# Patient Record
Sex: Female | Born: 1935 | Race: White | Hispanic: No | Smoking: Never smoker
Health system: Southern US, Community
[De-identification: ages and names within clinical notes are randomized; demographics above are authoritative.]

## PROBLEM LIST (undated history)

## (undated) DIAGNOSIS — F419 Anxiety disorder, unspecified: Secondary | ICD-10-CM

## (undated) DIAGNOSIS — A499 Bacterial infection, unspecified: Secondary | ICD-10-CM

## (undated) DIAGNOSIS — I2510 Atherosclerotic heart disease of native coronary artery without angina pectoris: Secondary | ICD-10-CM

## (undated) DIAGNOSIS — E559 Vitamin D deficiency, unspecified: Secondary | ICD-10-CM

## (undated) DIAGNOSIS — K219 Gastro-esophageal reflux disease without esophagitis: Secondary | ICD-10-CM

## (undated) DIAGNOSIS — F0280 Dementia in other diseases classified elsewhere without behavioral disturbance: Secondary | ICD-10-CM

## (undated) DIAGNOSIS — M6530 Trigger finger, unspecified finger: Secondary | ICD-10-CM

## (undated) DIAGNOSIS — I219 Acute myocardial infarction, unspecified: Secondary | ICD-10-CM

## (undated) DIAGNOSIS — F329 Major depressive disorder, single episode, unspecified: Secondary | ICD-10-CM

## (undated) DIAGNOSIS — E785 Hyperlipidemia, unspecified: Secondary | ICD-10-CM

## (undated) DIAGNOSIS — M810 Age-related osteoporosis without current pathological fracture: Secondary | ICD-10-CM

## (undated) DIAGNOSIS — I209 Angina pectoris, unspecified: Secondary | ICD-10-CM

## (undated) DIAGNOSIS — F0150 Vascular dementia without behavioral disturbance: Secondary | ICD-10-CM

## (undated) DIAGNOSIS — R42 Dizziness and giddiness: Secondary | ICD-10-CM

## (undated) DIAGNOSIS — E039 Hypothyroidism, unspecified: Secondary | ICD-10-CM

## (undated) DIAGNOSIS — M1990 Unspecified osteoarthritis, unspecified site: Secondary | ICD-10-CM

## (undated) DIAGNOSIS — F32A Depression, unspecified: Secondary | ICD-10-CM

## (undated) DIAGNOSIS — I10 Essential (primary) hypertension: Secondary | ICD-10-CM

## (undated) DIAGNOSIS — M069 Rheumatoid arthritis, unspecified: Secondary | ICD-10-CM

## (undated) HISTORY — DX: Acute myocardial infarction, unspecified: I21.9

## (undated) HISTORY — DX: Dizziness and giddiness: R42

## (undated) HISTORY — DX: Rheumatoid arthritis, unspecified: M06.9

## (undated) HISTORY — DX: Vitamin D deficiency, unspecified: E55.9

## (undated) HISTORY — DX: Hyperlipidemia, unspecified: E78.5

## (undated) HISTORY — PX: CORONARY ANGIOPLASTY: SHX604

## (undated) HISTORY — DX: Anxiety disorder, unspecified: F41.9

## (undated) HISTORY — DX: Age-related osteoporosis without current pathological fracture: M81.0

## (undated) HISTORY — DX: Vascular dementia, unspecified severity, without behavioral disturbance, psychotic disturbance, mood disturbance, and anxiety: F01.50

## (undated) HISTORY — DX: Depression, unspecified: F32.A

## (undated) HISTORY — DX: Trigger finger, unspecified finger: M65.30

## (undated) HISTORY — DX: Angina pectoris, unspecified: I20.9

## (undated) HISTORY — PX: HERNIA REPAIR: SHX51

## (undated) HISTORY — DX: Bacterial infection, unspecified: A49.9

## (undated) HISTORY — DX: Atherosclerotic heart disease of native coronary artery without angina pectoris: I25.10

## (undated) HISTORY — PX: KNEE ARTHROSCOPY: SHX127

---
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
## 1967-05-28 HISTORY — PX: ABDOMINAL HYSTERECTOMY: SHX81

## 1981-05-27 HISTORY — PX: TOTAL GASTRECTOMY: SHX2540

## 2002-05-27 HISTORY — PX: BREAST SURGERY: SHX581

## 2003-05-28 HISTORY — PX: BREAST BIOPSY: SHX20

## 2004-05-27 HISTORY — PX: OTHER SURGICAL HISTORY: SHX169

## 2004-12-10 ENCOUNTER — Encounter: Admission: RE | Admit: 2004-12-10 | Discharge: 2004-12-10 | Payer: Self-pay | Admitting: Oncology

## 2005-06-28 ENCOUNTER — Ambulatory Visit: Payer: Self-pay | Admitting: Oncology

## 2005-08-20 ENCOUNTER — Encounter: Admission: RE | Admit: 2005-08-20 | Discharge: 2005-08-20 | Payer: Self-pay | Admitting: Oncology

## 2005-11-01 ENCOUNTER — Ambulatory Visit: Payer: Self-pay | Admitting: Oncology

## 2005-12-19 ENCOUNTER — Encounter: Admission: RE | Admit: 2005-12-19 | Discharge: 2005-12-19 | Payer: Self-pay | Admitting: Oncology

## 2006-03-19 ENCOUNTER — Encounter: Admission: RE | Admit: 2006-03-19 | Discharge: 2006-06-17 | Payer: Self-pay | Admitting: Nurse Practitioner

## 2006-05-02 ENCOUNTER — Ambulatory Visit: Payer: Self-pay | Admitting: Oncology

## 2006-05-06 LAB — CBC WITH DIFFERENTIAL/PLATELET
BASO%: 0.2 % (ref 0.0–2.0)
EOS%: 2.4 % (ref 0.0–7.0)
HGB: 13.4 g/dL (ref 11.6–15.9)
LYMPH%: 29.8 % (ref 14.0–48.0)
MCHC: 33.8 g/dL (ref 32.0–36.0)
NEUT#: 3.3 10*3/uL (ref 1.5–6.5)
NEUT%: 60.7 % (ref 39.6–76.8)
WBC: 5.4 10*3/uL (ref 3.9–10.0)

## 2006-05-06 LAB — COMPREHENSIVE METABOLIC PANEL
ALT: 12 U/L (ref 0–35)
AST: 17 U/L (ref 0–37)
BUN: 23 mg/dL (ref 6–23)
Creatinine, Ser: 0.92 mg/dL (ref 0.40–1.20)
Total Bilirubin: 0.5 mg/dL (ref 0.3–1.2)

## 2006-10-30 ENCOUNTER — Ambulatory Visit: Payer: Self-pay | Admitting: Oncology

## 2006-12-22 ENCOUNTER — Encounter: Admission: RE | Admit: 2006-12-22 | Discharge: 2006-12-22 | Payer: Self-pay | Admitting: Oncology

## 2006-12-25 ENCOUNTER — Inpatient Hospital Stay (HOSPITAL_COMMUNITY): Admission: EM | Admit: 2006-12-25 | Discharge: 2006-12-27 | Payer: Self-pay | Admitting: Cardiology

## 2006-12-25 ENCOUNTER — Encounter: Payer: Self-pay | Admitting: Emergency Medicine

## 2007-07-22 ENCOUNTER — Encounter: Admission: RE | Admit: 2007-07-22 | Discharge: 2007-07-22 | Payer: Self-pay | Admitting: Internal Medicine

## 2007-09-15 ENCOUNTER — Ambulatory Visit: Payer: Self-pay | Admitting: Gastroenterology

## 2007-09-29 ENCOUNTER — Ambulatory Visit: Payer: Self-pay | Admitting: Gastroenterology

## 2007-10-13 ENCOUNTER — Ambulatory Visit: Payer: Self-pay | Admitting: Gastroenterology

## 2008-03-31 ENCOUNTER — Encounter: Admission: RE | Admit: 2008-03-31 | Discharge: 2008-03-31 | Payer: Self-pay | Admitting: Oncology

## 2008-05-27 DIAGNOSIS — I219 Acute myocardial infarction, unspecified: Secondary | ICD-10-CM

## 2008-05-27 HISTORY — DX: Acute myocardial infarction, unspecified: I21.9

## 2008-06-28 ENCOUNTER — Inpatient Hospital Stay (HOSPITAL_COMMUNITY): Admission: AD | Admit: 2008-06-28 | Discharge: 2008-06-29 | Payer: Self-pay | Admitting: Cardiology

## 2008-08-12 ENCOUNTER — Encounter: Payer: Self-pay | Admitting: Gastroenterology

## 2008-12-28 ENCOUNTER — Encounter: Admission: RE | Admit: 2008-12-28 | Discharge: 2008-12-28 | Payer: Self-pay | Admitting: Internal Medicine

## 2009-06-27 HISTORY — PX: HAMMER TOE SURGERY: SHX385

## 2009-12-25 HISTORY — PX: OTHER SURGICAL HISTORY: SHX169

## 2010-02-16 ENCOUNTER — Inpatient Hospital Stay (HOSPITAL_COMMUNITY): Admission: RE | Admit: 2010-02-16 | Discharge: 2010-02-20 | Payer: Self-pay | Admitting: Orthopedic Surgery

## 2010-08-09 LAB — GLUCOSE, CAPILLARY
Glucose-Capillary: 104 mg/dL — ABNORMAL HIGH (ref 70–99)
Glucose-Capillary: 108 mg/dL — ABNORMAL HIGH (ref 70–99)
Glucose-Capillary: 109 mg/dL — ABNORMAL HIGH (ref 70–99)
Glucose-Capillary: 119 mg/dL — ABNORMAL HIGH (ref 70–99)
Glucose-Capillary: 122 mg/dL — ABNORMAL HIGH (ref 70–99)
Glucose-Capillary: 141 mg/dL — ABNORMAL HIGH (ref 70–99)
Glucose-Capillary: 79 mg/dL (ref 70–99)
Glucose-Capillary: 84 mg/dL (ref 70–99)

## 2010-08-09 LAB — COMPREHENSIVE METABOLIC PANEL
ALT: 15 U/L (ref 0–35)
Albumin: 3.9 g/dL (ref 3.5–5.2)
Alkaline Phosphatase: 70 U/L (ref 39–117)
Calcium: 9.1 mg/dL (ref 8.4–10.5)
Glucose, Bld: 122 mg/dL — ABNORMAL HIGH (ref 70–99)
Potassium: 3.6 mEq/L (ref 3.5–5.1)
Sodium: 141 mEq/L (ref 135–145)
Total Protein: 6.4 g/dL (ref 6.0–8.3)

## 2010-08-09 LAB — CBC
HCT: 31.3 % — ABNORMAL LOW (ref 36.0–46.0)
MCH: 31.5 pg (ref 26.0–34.0)
MCHC: 33.5 g/dL (ref 30.0–36.0)
MCV: 92.9 fL (ref 78.0–100.0)
Platelets: 214 10*3/uL (ref 150–400)
RDW: 13.8 % (ref 11.5–15.5)
RDW: 13.8 % (ref 11.5–15.5)
WBC: 4.7 10*3/uL (ref 4.0–10.5)

## 2010-08-09 LAB — SURGICAL PCR SCREEN
MRSA, PCR: NEGATIVE
Staphylococcus aureus: NEGATIVE

## 2010-08-09 LAB — HEMOGLOBIN A1C: Hgb A1c MFr Bld: 6.8 % — ABNORMAL HIGH (ref <5.7)

## 2010-09-11 LAB — BASIC METABOLIC PANEL
BUN: 9 mg/dL (ref 6–23)
Calcium: 8.4 mg/dL (ref 8.4–10.5)
GFR calc non Af Amer: >60 mL/min (ref >60)
Glucose, Bld: 132 mg/dL — ABNORMAL HIGH (ref 70–99)
Potassium: 3.5 mEq/L (ref 3.5–5.1)
Sodium: 136 mEq/L (ref 135–145)

## 2010-09-11 LAB — GLUCOSE, CAPILLARY
Glucose-Capillary: 105 mg/dL — ABNORMAL HIGH (ref 70–99)
Glucose-Capillary: 122 mg/dL — ABNORMAL HIGH (ref 70–99)
Glucose-Capillary: 128 mg/dL — ABNORMAL HIGH (ref 70–99)
Glucose-Capillary: 129 mg/dL — ABNORMAL HIGH (ref 70–99)

## 2010-09-11 LAB — CBC
Platelets: 194 10*3/uL (ref 150–400)
RDW: 13.4 % (ref 11.5–15.5)
WBC: 4.8 10*3/uL (ref 4.0–10.5)

## 2010-10-09 NOTE — Discharge Summary (Signed)
NAME:  [name], Michelle [name] NO.:  [id number]   MEDICAL RECORD NO.:  [id number]          PATIENT TYPE:  INP   LOCATION:  2508                         FACILITY:  [facility]   PHYSICIAN:  Jake Bathe, MD      DATE OF BIRTH:  April 28, 1928   DATE OF ADMISSION:  06/28/2008  DATE OF DISCHARGE:  06/29/2008                               DISCHARGE SUMMARY   DISCHARGE DIAGNOSES:  1. Coronary artery disease status post Driver stent to the mid right      coronary artery.  2. Dyslipidemia.  3. Hypothyroidism.  4. Hypertension.  5. Diabetes mellitus.  6. Long-term medication use.   HOSPITAL COURSE:  [name] is [name] who had [name] recent  stress test for intrascapular discomfort with exertion that is relieved  with nitroglycerin.  Her stress test showed evidence of mild-to-moderate  ischemia in the mid anterior septal, apical anterior, mid inferior  lateral, and mid anterior lateral regions.   She was brought to the catheterization lab on June 28, 2008, and  found to have [name].  She has had [name]  previous stent in her circumflex and this was patent.  The patient then  underwent Driver stent placement to the right coronary artery without  problem.  The patient then remained in the hospital overnight and was  stable the following day and was felt to be stable enough to go home.   Lab studies show hemoglobin 11.4, hematocrit 34.6, platelets 194, white  count 4.8.  Sodium 136, potassium 3.5, BUN 9, creatinine 0.52.   DISCHARGE MEDICATIONS:  1. Levoxyl 50 mcg daily.  2. Toprol-XL 25 mg [name].  3. Zocor 40 mg [name].  4. Sublingual nitroglycerin p.r.n. chest pain.  5. Enteric-coated aspirin 325 mg [name].  6. Hydrochlorothiazide 25 mg [name].  7. Plavix 75 mg [name].  8. The patient is to restart metformin on Saturday, July 02, 2008.   The patient is to remain on [name].  Clean cath site gently with soap  and water.  No scrubbing.  Increase  activity slowly as per cardiac rehabilitation.  No lifting over 10  pounds for 1 week.  No driving for 2 days.  Follow up with Dr. Anne Fu on  July 14, 2008, at 10 [name].m.      Guy Franco, P.[name].      Jake Bathe, MD  Electronically Signed    LB/MEDQ  D:  06/29/2008  T:  06/29/2008  Job:  [id number]

## 2010-10-09 NOTE — Cardiovascular Report (Signed)
NAME:  Michelle [name], STOFKO NO.:  [id number]   MEDICAL RECORD NO.:  [id number]          PATIENT TYPE:  INP   LOCATION:  2508                         FACILITY:  [facility]   PHYSICIAN:  Jake Bathe, MD      DATE OF BIRTH:  1927-10-05   DATE OF PROCEDURE:  06/28/2008  DATE OF DISCHARGE:                            CARDIAC CATHETERIZATION   CARDIOLOGIST:  Francisca December, MD   Veverly Fells. Anne Fu, MD is Designer, television/film set.   PROCEDURE:  1. Left heart catheterization  2. Selective coronary angiography.  3. Left ventriculogram.  4. Abdominal aortogram.   INDICATIONS:  Abnormal nuclear stress test with prior coronary artery  disease, circumflex stent.   PROCEDURE IN DETAILS:  Informed consent was obtained.  Risk of stroke,  heart attack, death, renal impairment, and bleeding were explained to  the patient at length.  A fluoroscopic visualization of the femoral head  was obtained.  Lidocaine 1% was used for local anesthesia.  Modified  Seldinger technique was used to insert a 5-French sheath into the right  femoral artery.  The wire was inserted up into the iliac bifurcation.  Then, sheath was inserted under fluoroscopic guidance.  A Judkins right  #4 catheter was then used to steer the wire up past the iliac into the  abdominal aorta, then around to the heart.  This catheter was used to  selectively cannulate the right coronary artery.  An exchange-length  wire was then used and a Judkins left #4 catheter was used to cannulate  the left main artery.  Multiple views with hand injection of Omnipaque  were obtained with both catheters.  The pigtail catheter was then used  to cross the aortic valve into the left ventricle and a left  ventriculogram using power injection in the RAO position with 25 mL of  dye was performed.  Following this, pullback across the aortic valve was  obtained.  The angle pigtail was then brought to the level of the renal  arteries and a nonselective abdominal  aortogram was then performed  utilizing 40 mL of contrast.  Following the procedure, the 5-French  sheath was sewn into place, 4000 units of IV heparin was administered  and the patient was placed in the recovery room for approximately 30  minutes to 1 hour until Dr. Corliss Marcus was available to perform right  coronary artery percutaneous intervention.  Currently, Dr. Amil Amen is in  an emergency case with an acute-ST-elevation myocardial infarction.  Findings were discussed with both the patient family and Dr. Amil Amen.   FINDINGS:  1. Left main artery - it bifurcates into the LAD and circumflex.  No      angiographically significant coronary artery disease.  2. Left anterior descending artery - there is 1 major diagonal branch      and the artery continues to wrap around the apex.  There are minor      irregularities throughout this arterial system.  However, no      significant coronary artery disease present.  3. Circumflex artery - the proximally placed circumflex stent is  widely patent.  This was placed on December 26, 2006, by Dr. Amil Amen      and is a multi-length Vision stent 3.5 mm x 15 mm.  There are 3      obtuse marginal arteries, all of which are widely patent.  4. Right coronary artery - there is a mid focal 80% stenosis which is      new from her prior cardiac catheterization.  This is the dominant      vessel giving rise to the PDA.  5. Left ventriculogram - no wall motion abnormality, normal ejection      fraction of approximately 60-65% with 1+ mitral regurgitation which      may be catheter induced.   HEMODYNAMICS:  Left ventricular systolic pressure 134 with an end-  diastolic pressure of 13 mmHg and aortic pressure 138/67 with a mean of  95 mmHg.  There is no gradient across the aortic valve.   Abdominal aortogram - bilateral renal arteries are widely patent.  The  proximal portion of the right internal iliac is highly tortuous with  mild atherosclerosis  present.  Otherwise, there is no evidence of  abdominal aortic aneurysm.   IMPRESSION:  1. New focal 80% mid right coronary artery stenosis.  2. Widely patent Vision circumflex stent.  3. Normal left ventricular ejection fraction of 65%  4. 1+ mitral regurgitation which may be catheter induced.  5. Tortuous right common iliac artery with mild atherosclerosis - no      evidence of renal artery stenosis or abdominal aortic aneurysm.   PLAN:  Findings were discussed with Dr. Amil Amen who currently is in an  emergency procedure, STEMI.  The patient as stated above was given 4000  units of IV heparin, a 5-French sheath was sewn into place, and taken to  recovery room for approximately 30-45 minutes until Dr. Amil Amen was  available for right coronary artery percutaneous intervention.  Findings  were discussed with family as discussed above.      Jake Bathe, MD  Electronically Signed     MCS/MEDQ  D:  06/28/2008  T:  06/29/2008  Job:  [id number]   cc:   Francisca December, M.D.

## 2010-10-09 NOTE — Cardiovascular Report (Signed)
NAMEMarland Kitchen  Michelle, [name] NO.:  [id number]   MEDICAL RECORD NO.:  [id number]          PATIENT TYPE:  INP   LOCATION:  2508                         FACILITY:  [facility]   PHYSICIAN:  Francisca December, M.D.  DATE OF BIRTH:  [birth year]   DATE OF PROCEDURE:  06/28/2008  DATE OF DISCHARGE:  06/29/2008                            CARDIAC CATHETERIZATION   PROCEDURES PERFORMED:  1. Percutaneous coronary intervention/bare-metal stent implantation,      mid right coronary.  2. Percutaneous closure, right femoral artery.   INDICATIONS:  Michelle [name] is a pleasant 77 year old woman with atypical  angina who underwent a preoperative risk screen using pharmacologic-  induced myocardial perfusion imaging.  It did reveal apical and inferior  reversible defect.  She has been brought to the hospital for outpatient  cardiac catheterization revealing 80% stenosis in the large mid right  coronary.  This study was completed by Dr. Donato Schultz.  She is returned  to the laboratory at this time for percutaneous coronary intervention  via the previously placed catheter sheath.   PROCEDURAL NOTE:  The patient was brought to the cardiac catheterization  laboratory from the holding area and in stable condition.  The right  groin was prepped and draped in usual sterile fashion.  The 5-French  catheter sheath had remained in place.  Local anesthesia was obtained  with additional 2% Xylocaine.  The catheter sheath was then removed and  exchanged for a 6-French catheter sheath over a long guiding J-wire.  Gloves were changed and we proceeded with the intervention.  The patient  had an ACT performed, which was 160 seconds.  She had previously  received 4000 units of heparin approximately 2-1/2 hours prior to the  procedure.  She received an additional 20 units per kg.  She also  received a double bolus and constant infusion of Integrilin.  Resultant  ACT was 305 seconds.  A 6-French #4 right Judkins  guiding catheter was  advanced to the ascending aorta where the right coronary os was engaged.  A 0.014-inch Luge intracoronary guidewire was passed across the lesion  without difficulty.  Initial balloon dilatation was performed with a  3.0/15-mm apex balloon dilating catheter.  This was inflated to 6  atmospheres for 39 seconds.  It was deflated and removed and a 3.5/15-mm  ABET Multilink Vision bare-metal stent was then advanced into place and  deployed at peak pressure of 15 atmospheres for 29 seconds.  The stent  balloon was deflated and removed and a postdilatation balloon 4.0 x 12  mm Scimed Quantum Maverick was advanced into place and carefully  positioned within the stent.  It was inflated to 16 atmospheres for 53  seconds.  Following these maneuvers, there was wide patency confirmed in  orthogonal views both with and without the guidewire in place.  A  guiding catheter was then removed.  A right femoral arteriogram in the  45 degrees RAO angulation via the catheter sheath by hand injection  documented adequate anatomy for placement of percutaneous closure device  Angio-Seal.  This was subsequently deployed with good  hemostasis and an  intact pulse.   Angiography, as mentioned the lesion treated was in the midportion of  the right coronary was relatively focal in nature, concentric, and 80%  stenotic.  Following balloon dilatation and stent implantation, there  was no residual stenosis.   IMPRESSION:  1. Atherosclerotic cardiovascular disease, two-vessel.  The patient      has previous stent implant in the left      circumflex, which was widely patent confirmed by Dr. Anne Fu.  2. Successful percutaneous coronary intervention/bare-metal stent      implantation, mid right coronary.  3. Typical angina was not reproduced with device insertion or balloon      inflation.      Francisca December, M.D.  Electronically Signed     JHE/MEDQ  D:  06/29/2008  T:  06/30/2008  Job:   [id number]

## 2010-10-09 NOTE — Letter (Signed)
September 15, 2007    Ralene Ok, M.D.  [address]  [place], [place] [address]   RE:  HAYDAN, WEDIG  MRN:  [id number]  /  DOB:  10-10-1927   Dear Dr. Ludwig Clarks:   Upon your kind referral, I had the pleasure of evaluating your patient  and I am pleased to offer my findings.  I saw Michelle [name]. Castrillon in the  office today.  Enclosed is a copy of my progress note that details my  findings and recommendations.   Thank you for the opportunity to participate in your patient's care.    Sincerely,      Barbette Hair. Arlyce Dice, MD,FACG  Electronically Signed    RDK/MedQ  DD: 09/15/2007  DT: 09/15/2007  Job #: [id number]

## 2010-10-09 NOTE — Assessment & Plan Note (Signed)
[facility]                         GASTROENTEROLOGY OFFICE NOTE   Michelle [name], Michelle [name]                         MRN:          [id number]  DATE:09/15/2007                            DOB:          May 01, 1928    REASON FOR CONSULTATION:  Constipation.   Michelle [name] is a pleasant 77 year old white female referred through the  courtesy of Dr. Ludwig Clarks for evaluation.  Over the past two months, she  has noted an abrupt change in bowel habits, characterized by  constipation.  She will go up to 10 days without a spontaneous bowel  movement.  She denies abdominal or rectal pain.  There is no history of  melena or hematochezia.  A recent CT scan showed diverticulosis only.   PAST MEDICAL HISTORY:  1. Coronary artery disease.  She is status post MI and angioplasty      with stent placement.  2. She has diabetes.  3. Arthritis.  4. History of breast cancer.  5. Kidney stones.  6. She is status post partial gastrectomy for ulcer disease.  7. She is status post herniorrhaphy.  8. Hysterectomy.   FAMILY HISTORY:  Noncontributory.   MEDICATIONS:  Metformin, metoprolol, Levoxyl, simvastatin, and HCTZ.   She has no allergies.   She does not smoke.  She drinks rarely.  She is married and retired.   REVIEW OF SYSTEMS:  Positive for joint pain, sleeping problems, back  pain, and some leakage of urine.  Otherwise negative.   PHYSICAL EXAMINATION:  GENERAL:  She is a healthy-appearing female.  VITAL SIGNS:  Pulse 60, blood pressure 106/54, weight 167.  HEENT: EOMI.  PERRLA.  Sclerae are anicteric.  Conjunctivae are pink.  NECK:  Supple without thyromegaly, adenopathy or carotid bruits.  CHEST:  Clear to auscultation and percussion without adventitious  sounds.  CARDIAC:  Regular rhythm; normal S1 S2.  There are no murmurs, gallops  or rubs.  ABDOMEN:  Bowel sounds are normoactive.  Abdomen is soft, nontender and  nondistended.  There are no abdominal masses,  tenderness, splenic  enlargement or hepatomegaly.  EXTREMITIES:  Full range of motion.  No cyanosis, clubbing or edema.  RECTAL:  Deferred.  MUSCULOSKELETAL:  There are no deformities.  NEUROLOGIC:  Nonfocal.   IMPRESSION:  1. New onset constipation.  Obstructive lesion in the colon is a      consideration.  She could have luminal narrowing from diverticular      disease.  A partial obstruction from a neoplasm is also a      consideration.  2. Diabetes and coronary artery disease - stable.  3. History of breast cancer.   RECOMMENDATION:  Colonoscopy.     Barbette Hair. Arlyce Dice, MD,FACG  Electronically Signed    RDK/MedQ  DD: 09/15/2007  DT: 09/15/2007  Job #: [id number]   cc:   Ralene Ok, M.D.

## 2010-10-09 NOTE — Cardiovascular Report (Signed)
NAME:  [name], [name] NO.:  [id number]   MEDICAL RECORD NO.:  [id number]          PATIENT TYPE:  INP   LOCATION:  6529                         FACILITY:  [facility]   PHYSICIAN:  Francisca December, M.D.  DATE OF BIRTH:  [birth year]   DATE OF PROCEDURE:  12/26/2006  DATE OF DISCHARGE:                            CARDIAC CATHETERIZATION   PROCEDURES PERFORMED:  1. Left heart catheterization.  2. Left ventriculogram.  3. Coronary angiography.  4. Percutaneous coronary intervention/bare metal stent implantation at      proximal left circumflex.  5. Percutaneous closure of right femoral artery.   INDICATIONS:  Michelle [name] is a 77 year old woman without prior cardiac  history who presented yesterday with an unstable angina syndrome.  Overnight, she ruled in for a non-ST-segment elevation myocardial  infarction.  Peak CK-MB is 22.  Troponin was 5.  There were ischemic  electrographic changes laterally on her ECG, but no ST elevation.  She  is brought to catheterization laboratory at this time to identify the  extent of disease and provide for further therapeutic options.   PROCEDURE NOTE:  The patient is brought to cardiac catheterization  laboratory in the fasting state.  The right groin was prepped and draped  in the usual sterile fashion.  Local anesthesia was obtained with  infiltration of 1% lidocaine.  A 6-French catheter sheath was inserted  percutaneously into the right femoral artery utilizing an anterior  approach over a guiding J-wire.  Left heart catheterization and coronary  angiography then proceeded in the standard fashion using 6-French, #4  left and right Judkins catheters.  A 110-cm pigtail catheter was used to  perform cineangiography at the left ventricle.  This was done in an RAO  projection utilizing a power injector with 39 mL injected at 13  mL/second.  All catheter manipulations were performed using fluoroscopic  observation and exchanges performed  over long, guiding J-wire.   I then proceeded with percutaneous intervention.  A 6-French, #3.5 left,  Voda guiding catheter was advanced to the ascending aorta where the left  coronary os was engaged.  The patient received 25 mg of Lovenox  intravenously.  Her last subcutaneous dose was at 0500 hours.  She was  also on a constant infusion of Integrilin.  A 0.014 inch Luge  intracoronary guidewire was passed across the lesion in the proximal  circumflex without difficulty.  Initial balloon dilatation was performed  with a 3 x 15-mm Scimed Maverick.  This was carefully positioned and  inflated to a peak pressure of 6 atmospheres x39 seconds.  This balloon  was deflated and removed and a 3.5 x 15-mm, multi-length Vision bare  metal stent was advanced into place, carefully positioned and deployed  at peak pressure of 15 atmospheres x29 seconds.  The stent balloon was  deflated, removed and post dilatation performed with a 4 x 12-mm Quantum  Maverick intracoronary balloon.  This was carefully positioned within  the stent and inflated to a peak pressure of 16 atmospheres x53 seconds.  This balloon was deflated and removed and following confirmation of  adequate patency and orthogonal views, both with and without the  guidewire in place, the guiding catheter was removed.  A right femoral  arteriogram in the 45-degree RAO angulation via the catheter sheath by  hand injection documented adequate anatomy for placement of the  percutaneous closure device and Angio-Seal.  This was subsequent  deployed with good hemostasis and an intact distal pulse.  The patient  was transported to the recovery area in stable condition.   HEMODYNAMIC RESULTS:  Systemic arterial pressure was 100/55 with a mean  of 73 mmHg.  There was no systolic gradients across the aortic valve.  The left ventricular end-diastolic pressure was 19 mmHg pre-  ventriculogram.   Angiography:  The left ventriculogram demonstrated  normal chamber size  and normal global systolic function without clear regional wall motion  abnormality in a 30-degree RAO angulation.  A visual estimate of the  ejection fraction is 65-70%.  There is left coronary calcification seen.  The aortic valve is trileaflet and opens normally during systole.  Some  mitral vegetation is seen and it is difficult to quantify as there is  catheter-induced ventricular tachycardia.   There is a right-dominant coronary system present.  The main left  coronary was normal.   The left anterior descending artery and its branches demonstrated no  significant obstruction.  There is a 10% proximal eccentric stenosis.  A  single large diagonal branch arises and at the origin of this diagonal  branch, there is a 50% stenosis.  The ongoing anterior descending artery  reaches and traverses the apex.   The left circumflex coronary artery and its branches are highly  diseased.  The proximal portion of the vessel contains a subtotal  concentric, tubular, 90% stenosis with evidence of intraluminal  thrombus.  The ongoing circumflex then gives rise to three marginal  branches, all of which are large to moderate in size.  No obstructions  are seen in these more distal vessels.   The right coronary artery and its branches are large and dominant.  There is a tubular 20-30% mid vessel stenosis.  No obstructions are seen  in the distal vessel and distal vessel gives rise to a large acute  marginal which provides some distal septal perforators.  The posterior  descending artery is relatively small and then there is a moderate sized  posterolateral segment with three left ventricular branches, all of  which are small to moderate in size as well.   Collateral vessels are not seen.   Following balloon dilatation and stent implantation in the proximal  circumflex.  There was no residual stenosis seen.  There was a good  step-up and step-down at the proximal and distal  ends of the stent  respectively.  The stent itself was well-approximated to the vessel  wall.   FINAL IMPRESSION:  1. Atherosclerotic coronary vascular disease, single vessel.  2. Intact left ventricular size and global systolic function.  3. Status post successful percutaneous coronary intervention/bare      metal stent implantation proximal left circumflex.  4. Typical angina was not reproduced with device insertion or balloon      inflation.      Francisca December, M.D.  Electronically Signed     JHE/MEDQ  D:  12/26/2006  T:  12/26/2006  Job:  [id number]

## 2010-10-09 NOTE — Letter (Signed)
September 15, 2007    Kai Levins. Hicks   RE:  TINA, GRUNER  MRN:  [id number]  /  DOB:  February 25, 1928   Dear Ms. Charnley:   It is my pleasure to have treated you recently as a new patient in my  office.  I appreciate your confidence and the opportunity to participate  in your care.   Since I do have a busy inpatient endoscopy schedule and office schedule,  my office hours vary weekly.  I am, however, available for emergency  calls every day through my office.  If I cannot promptly meet an urgent  office appointment, another one of our gastroenterologists will be able  to assist you.   My well-trained staff are prepared to help you at all times.  For  emergencies after office hours, a physician from our gastroenterology  section is always available through my 24-hour answering service.   While you are under my care, I encourage discussion of your questions  and concerns, and I will be happy to return your calls as soon as I am  available.   Once again, I welcome you as a new patient and I look forward to a happy  and healthy relationship.    Sincerely,      Barbette Hair. Arlyce Dice, MD,FACG  Electronically Signed   RDK/MedQ  DD: 09/15/2007  DT: 09/15/2007  Job #: [id number]

## 2011-03-11 LAB — DIFFERENTIAL
Eosinophils Absolute: 0
Lymphocytes Relative: 9 — ABNORMAL LOW
Lymphs Abs: 0.6 — ABNORMAL LOW
Monocytes Relative: 3
Neutro Abs: 6.1
Neutrophils Relative %: 88 — ABNORMAL HIGH

## 2011-03-11 LAB — TROPONIN I: Troponin I: 0.17 — ABNORMAL HIGH

## 2011-03-11 LAB — CARDIAC PANEL(CRET KIN+CKTOT+MB+TROPI)
CK, MB: 22 — ABNORMAL HIGH
CK, MB: 9 — ABNORMAL HIGH
CK, MB: 23.3 — ABNORMAL HIGH
Relative Index: 6.8 — ABNORMAL HIGH
Relative Index: 9 — ABNORMAL HIGH
Relative Index: 8.8 — ABNORMAL HIGH
Total CK: 132
Total CK: 265 — ABNORMAL HIGH
Troponin I: 2.81
Troponin I: 5.08

## 2011-03-11 LAB — CK TOTAL AND CKMB (NOT AT ARMC)
CK, MB: 5.2 — ABNORMAL HIGH
Relative Index: 4.3 — ABNORMAL HIGH
Total CK: 171
Total CK: 120

## 2011-03-11 LAB — CBC
HCT: 32 — ABNORMAL LOW
Hemoglobin: 10.7 — ABNORMAL LOW
Hemoglobin: 12.6
MCHC: 34.2
MCV: 91.6
MCV: 90.3
Platelets: 177
Platelets: 194
RBC: 3.46 — ABNORMAL LOW
RBC: 4.07
RDW: 13.7
WBC: 5.8
WBC: 6.4

## 2011-03-11 LAB — COMPREHENSIVE METABOLIC PANEL
ALT: 21
BUN: 25 — ABNORMAL HIGH
CO2: 23
Calcium: 9.4
Creatinine, Ser: 0.87
GFR calc non Af Amer: >60
Glucose, Bld: 182 — ABNORMAL HIGH
Sodium: 141
Total Protein: 6.4

## 2011-03-11 LAB — LIPID PANEL
Cholesterol: 230 — ABNORMAL HIGH
HDL: 45
HDL: 56
LDL Cholesterol: 109 — ABNORMAL HIGH
LDL Cholesterol: 124 — ABNORMAL HIGH
Total CHOL/HDL Ratio: 4.1
Triglycerides: 103

## 2011-03-11 LAB — HEMOGLOBIN A1C
Hgb A1c MFr Bld: 6.4 — ABNORMAL HIGH
Mean Plasma Glucose: 151

## 2011-03-11 LAB — TSH: TSH: 0.339 — ABNORMAL LOW

## 2011-03-11 LAB — BASIC METABOLIC PANEL
CO2: 26
Chloride: 109
Creatinine, Ser: 0.6
GFR calc Af Amer: >60
Potassium: 3.9

## 2011-03-11 LAB — B-NATRIURETIC PEPTIDE (CONVERTED LAB): Pro B Natriuretic peptide (BNP): 129 — ABNORMAL HIGH

## 2011-03-11 LAB — D-DIMER, QUANTITATIVE: D-Dimer, Quant: 0.57 — ABNORMAL HIGH

## 2011-03-11 LAB — MAGNESIUM: Magnesium: 2

## 2011-03-11 LAB — PROTIME-INR: Prothrombin Time: 12

## 2011-03-11 LAB — POCT CARDIAC MARKERS: CKMB, poc: 3.3

## 2011-03-20 ENCOUNTER — Other Ambulatory Visit: Payer: Self-pay | Admitting: Internal Medicine

## 2011-03-20 ENCOUNTER — Ambulatory Visit
Admission: RE | Admit: 2011-03-20 | Discharge: 2011-03-20 | Disposition: A | Discharge status: Home / Self Care | Payer: Medicare Other | Source: Ambulatory Visit | Attending: Internal Medicine | Admitting: Internal Medicine

## 2011-03-20 DIAGNOSIS — M549 Dorsalgia, unspecified: Secondary | ICD-10-CM

## 2011-05-28 DIAGNOSIS — M069 Rheumatoid arthritis, unspecified: Secondary | ICD-10-CM

## 2011-05-28 HISTORY — DX: Rheumatoid arthritis, unspecified: M06.9

## 2011-07-15 ENCOUNTER — Other Ambulatory Visit: Payer: Self-pay | Admitting: Internal Medicine

## 2011-07-15 DIAGNOSIS — R1032 Left lower quadrant pain: Secondary | ICD-10-CM

## 2011-07-18 ENCOUNTER — Ambulatory Visit
Admission: RE | Admit: 2011-07-18 | Discharge: 2011-07-18 | Disposition: A | Discharge status: Home / Self Care | Payer: Medicare Other | Source: Ambulatory Visit | Attending: Internal Medicine | Admitting: Internal Medicine

## 2011-07-18 DIAGNOSIS — R1032 Left lower quadrant pain: Secondary | ICD-10-CM

## 2011-07-18 MED ORDER — IOHEXOL 300 MG/ML  SOLN
125.0000 mL | Freq: Once | INTRAMUSCULAR | Status: AC | PRN
  Administered 2011-07-18: 125 mL via INTRAVENOUS

## 2012-01-21 ENCOUNTER — Other Ambulatory Visit: Payer: Self-pay | Admitting: Internal Medicine

## 2012-01-21 DIAGNOSIS — M8580 Other specified disorders of bone density and structure, unspecified site: Secondary | ICD-10-CM

## 2012-01-29 ENCOUNTER — Ambulatory Visit
Admission: RE | Admit: 2012-01-29 | Discharge: 2012-01-29 | Disposition: A | Discharge status: Home / Self Care | Payer: Medicare Other | Source: Ambulatory Visit | Attending: Internal Medicine | Admitting: Internal Medicine

## 2012-01-29 DIAGNOSIS — M8580 Other specified disorders of bone density and structure, unspecified site: Secondary | ICD-10-CM

## 2012-02-14 ENCOUNTER — Encounter (HOSPITAL_COMMUNITY): Payer: Self-pay | Admitting: Pharmacy Technician

## 2012-02-21 ENCOUNTER — Ambulatory Visit (HOSPITAL_COMMUNITY)
Admission: RE | Admit: 2012-02-21 | Discharge: 2012-02-21 | Disposition: A | Discharge status: Home / Self Care | Payer: Medicare Other | Source: Ambulatory Visit | Attending: Orthopedic Surgery | Admitting: Orthopedic Surgery

## 2012-02-21 ENCOUNTER — Encounter (HOSPITAL_COMMUNITY): Payer: Self-pay

## 2012-02-21 ENCOUNTER — Encounter (HOSPITAL_COMMUNITY)
Admission: RE | Admit: 2012-02-21 | Discharge: 2012-02-21 | Disposition: A | Discharge status: Home / Self Care | Payer: Medicare Other | Source: Ambulatory Visit | Attending: Orthopedic Surgery | Admitting: Orthopedic Surgery

## 2012-02-21 DIAGNOSIS — Z01812 Encounter for preprocedural laboratory examination: Secondary | ICD-10-CM | POA: Insufficient documentation

## 2012-02-21 DIAGNOSIS — Z01818 Encounter for other preprocedural examination: Secondary | ICD-10-CM | POA: Insufficient documentation

## 2012-02-21 HISTORY — DX: Acute myocardial infarction, unspecified: I21.9

## 2012-02-21 HISTORY — DX: Atherosclerotic heart disease of native coronary artery without angina pectoris: I25.10

## 2012-02-21 HISTORY — DX: Unspecified osteoarthritis, unspecified site: M19.90

## 2012-02-21 HISTORY — DX: Essential (primary) hypertension: I10

## 2012-02-21 HISTORY — DX: Hypothyroidism, unspecified: E03.9

## 2012-02-21 HISTORY — DX: Gastro-esophageal reflux disease without esophagitis: K21.9

## 2012-02-21 LAB — URINALYSIS, ROUTINE W REFLEX MICROSCOPIC
Bilirubin Urine: NEGATIVE
Glucose, UA: NEGATIVE mg/dL
Hgb urine dipstick: NEGATIVE
Ketones, ur: NEGATIVE mg/dL
Nitrite: NEGATIVE
Protein, ur: NEGATIVE mg/dL
Specific Gravity, Urine: 1.029 (ref 1.005–1.030)
Urobilinogen, UA: 1 mg/dL (ref 0.0–1.0)
pH: 6.5 (ref 5.0–8.0)

## 2012-02-21 LAB — COMPREHENSIVE METABOLIC PANEL
ALT: 22 U/L (ref 0–35)
AST: 29 U/L (ref 0–37)
Albumin: 3.7 g/dL (ref 3.5–5.2)
Alkaline Phosphatase: 103 U/L (ref 39–117)
BUN: 17 mg/dL (ref 6–23)
CO2: 29 mEq/L (ref 19–32)
Calcium: 9.5 mg/dL (ref 8.4–10.5)
Chloride: 103 mEq/L (ref 96–112)
Creatinine, Ser: 0.67 mg/dL (ref 0.50–1.10)
GFR calc Af Amer: >90 mL/min (ref >90)
GFR calc non Af Amer: 80 mL/min — ABNORMAL LOW (ref >90)
Glucose, Bld: 123 mg/dL — ABNORMAL HIGH (ref 70–99)
Potassium: 3.7 mEq/L (ref 3.5–5.1)
Sodium: 141 mEq/L (ref 135–145)
Total Bilirubin: 0.6 mg/dL (ref 0.3–1.2)
Total Protein: 6.7 g/dL (ref 6.0–8.3)

## 2012-02-21 LAB — APTT: aPTT: 27 seconds (ref 24–37)

## 2012-02-21 LAB — PROTIME-INR
INR: 0.94 (ref 0.00–1.49)
Prothrombin Time: 12.5 seconds (ref 11.6–15.2)

## 2012-02-21 LAB — SURGICAL PCR SCREEN
MRSA, PCR: NEGATIVE
Staphylococcus aureus: NEGATIVE

## 2012-02-21 LAB — URINE MICROSCOPIC-ADD ON

## 2012-02-21 LAB — CBC
MCHC: 32.9 g/dL (ref 30.0–36.0)
Platelets: 252 10*3/uL (ref 150–400)
RDW: 13.4 % (ref 11.5–15.5)

## 2012-02-21 NOTE — Patient Instructions (Addendum)
20      Your procedure is scheduled on:  Wednesday 02/26/2012 at 1 pm  Report to [facility] at 1030  AM.  Call this number if you have problems the morning of surgery: [phone]   Remember: DO NOT TAKE ANY DIABETIC MEDICATIONS THE MORNING OF SURGERY!   Do not eat food or drink liquids after midnight!  Take these medicines the morning of surgery with A SIP OF WATER: Levothyroxine, Lipitor   Do not bring valuables to the hospital.  .  Leave suitcase in the car. After surgery it may be brought to your room.  For patients admitted to the hospital, checkout time is 11:00 AM the day of              Discharge.    Special Instructions: See [facility] Preparing  For Surgery Instruction Sheet. Do not wear jewelry, lotions powders, perfumes. Women do not shave  legs or underarms for 12 hours before showers. Contacts, partial plates, or dentures may not be worn into surgery.                          Patients discharged the day of surgery will not be allowed to drive home. If going home the same day of surgery, must have someone stay with you first 24 hrs.at home and arrange for someone to drive you home from the              Hospital.   Please read over the following fact sheets that you were given: MRSA              INFORMATION,Sleep Apnea sheet, Incentive Spirometry Sheet, Blood Transfusion SHeet               Telford Nab.Adryan Shin,RN,BSN [id number]

## 2012-02-21 NOTE — Progress Notes (Signed)
EKG 02/04/2012 from [name],Stress test with resting EKG 02/07/2012 from [facility] ,all on chart

## 2012-02-25 NOTE — H&P (Signed)
Michelle [name] DOB: 01/19/1928  Chief Complaint: right knee pain  History of Present Illness The patient is a 78 year old female who comes in today for a preoperative History and Physical. The patient is scheduled for a right total knee arthroplasty to be performed by Dr. Georges Lynch. Darrelyn Hillock, MD at [facility] on Wednesday February 26, 2012 . She has been followed for her bilateral knee pain for over a year. The right knee is giving her a lot of trouble with pain and instability. She has had cortisone injections which were initial beneficial but are less effective recently. She also had viscosupplementation with Synvisc with limited benefit.  X-rays show that she has severe joint space narrowing with complete collapse of the medial compartment of the right knee and osteophyte formation in the right knee. Due to failure of conservative measures, the most predicatable means for increased function and decreased pain in the right knee is a right total knee arthroplasty.  PCP: Dr. 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  Hallux valgus, acquired (735.0).  Spondylolisthesis, acquired (738.4).  Diabetes Mellitus, Type II Hypercholesterolemia Osteoarthritis Hypertension Myocardial infarction. 2008, 2010 Hiatal Hernia Hypothyroidism Breast Cancer   Allergies No Known Drug Allergies.    Family History Father. deceased age 27 due to complications from pneumonia Mother. deceased age 38 due to complications from pneumonia Siblings. Brother age 13; heart disease, heart stents x 6/Sister 62 deceased age due to complications from DM type II and hip fracture   Social History Tobacco use No alcohol use Post-Surgical Plans. going to [facility]   Medication History Atorvastatin Calcium (40MG  Tablet, Oral daily) Active. Nitrostat (0.4MG  Tab Sublingual,  Sublingual) Active. Hydrochlorothiazide (25MG  Tablet, Oral daily) Active. Levoxyl ( Tablet, Oral daily) Active. MetFORMIN HCl (1000MG  Tablet, Oral daily) Active. Aspirin Low Strength (81MG  Tablet Chewable, Oral daily) Active. Metoprolol Tartrate (25MG  Tablet, Oral daily) Active.  Fosamax 70mg    Past Surgical History Hysterectomy (not due to cancer) - Complete. 1969 Wrist Surgery. DeQuervain's 1973 Gastrectomy. 1983 Vagotomy. 1973 Inguinal Hernia Repair. 1999, 2001 Breast Mass; Local Excision. right 2005 Lumpectomy. right breast 2004 Heart Stents. 2008, 2010 Foot Surgery - Right. bunion/hammertoe 2011 Foot Surgery. right; second toe amputation 2011    Review of Systems General:Not Present- Chills, Fever, Night Sweats, Fatigue, Weight Gain, Weight Loss and Memory Loss. Skin:Not Present- Hives, Itching, Rash, Eczema and Lesions. HEENT:Not Present- Tinnitus, Headache, Double Vision, Visual Loss, Hearing Loss and Dentures. Respiratory:Not Present- Shortness of breath with exertion, Shortness of breath at rest, Allergies, Coughing up blood and Chronic Cough. Cardiovascular:Not Present- Chest Pain, Racing/skipping heartbeats, Difficulty Breathing Lying Down, Murmur, Swelling and Palpitations. Gastrointestinal:Present- Constipation. Not Present- Bloody Stool, Heartburn, Abdominal Pain, Vomiting, Nausea, Diarrhea, Difficulty Swallowing, Jaundice and Loss of appetitie. Female Genitourinary:Not Present- Blood in Urine, Urinary frequency, Weak urinary stream, Discharge, Flank Pain, Incontinence, Painful Urination, Urgency, Urinary Retention and Urinating at Night. Musculoskeletal:Present- Joint Pain and Morning Stiffness. Not Present- Muscle Weakness, Muscle Pain, Joint Swelling, Back Pain and Spasms. Neurological:Not Present- Tremor, Dizziness, Blackout spells, Paralysis, Difficulty with balance and Weakness. Psychiatric:Not Present- Insomnia.   Vitals Weight:  159 lb Height: 61 in Body Surface Area: 1.76 m Body Mass Index: 30.04 kg/m Pulse: 67 (Regular) Resp.: 18 (Unlabored) BP: 133/86 (Sitting, Left Arm, Standard)    Physical Exam General Mental Status - Alert, cooperative and good historian. General Appearance- pleasant. Not in acute distress. Orientation- Oriented X3. Build &  Nutrition- Overweight, Well nourished and Well developed. Head and Neck Head- normocephalic, atraumatic . Neck Global Assessment- supple. no bruit auscultated on the right and no bruit auscultated on the left Eye Pupil- Bilateral- Regular and Round. Motion- Bilateral- EOMI. Chest and Lung Exam Auscultation: Breath sounds:- clear at anterior chest wall and - clear at posterior chest wall. Adventitious sounds:- No Adventitious sounds. Cardiovascular Auscultation:Rhythm- Regular rate and rhythm. Heart Sounds- S1 WNL and S2 WNL. Murmurs & Other Heart Sounds:Auscultation of the heart reveals - No Murmurs. Abdomen Palpation/Percussion:Tenderness- Abdomen is non-tender to palpation. Rigidity (guarding)- Abdomen is soft. Auscultation:Auscultation of the abdomen reveals - Bowel sounds normal Female Genitourinary Not done, not pertinent to present illness Peripheral Vascular Upper Extremity: Palpation:- Pulses bilaterally normal. Lower Extremity: Palpation:- Pulses bilaterally normal Neurologic Examination of related systems reveals - normal muscle strength and tone in all extremities. Neurologic evaluation reveals - normal sensation. Musculoskeletal Second digit of right foot amputated in 2011. Normal painless motion of the hips. Pain with motion of the right knee. Gen varus. Severe patellofemoral crepitus. No effusion or instability. 5 to 110 degrees. Left knee genu varus but not as significant. No effusion or instability. Both knee tender along the medial joint line.   Assessment & Plan Osteoarthritis, Knee  (715.96) Patient is admitted for a right total knee arthroplasty     Dimitri Ped, PA-C

## 2012-02-26 ENCOUNTER — Encounter (HOSPITAL_COMMUNITY)
Admission: RE | Disposition: A | Discharge status: Skilled Nursing Facility | Payer: Self-pay | Source: Ambulatory Visit | Attending: Orthopedic Surgery

## 2012-02-26 ENCOUNTER — Encounter (HOSPITAL_COMMUNITY): Payer: Self-pay | Admitting: Anesthesiology

## 2012-02-26 ENCOUNTER — Encounter (HOSPITAL_COMMUNITY): Payer: Self-pay

## 2012-02-26 ENCOUNTER — Inpatient Hospital Stay (HOSPITAL_COMMUNITY)
Admission: RE | Admit: 2012-02-26 | Discharge: 2012-02-29 | DRG: 470 | Disposition: A | Discharge status: Skilled Nursing Facility | Payer: Medicare Other | Source: Ambulatory Visit | Attending: Orthopedic Surgery | Admitting: Orthopedic Surgery

## 2012-02-26 ENCOUNTER — Encounter (HOSPITAL_COMMUNITY): Payer: Self-pay | Admitting: *Deleted

## 2012-02-26 ENCOUNTER — Inpatient Hospital Stay (HOSPITAL_COMMUNITY): Payer: Medicare Other | Admitting: Anesthesiology

## 2012-02-26 ENCOUNTER — Inpatient Hospital Stay (HOSPITAL_COMMUNITY): Payer: Medicare Other

## 2012-02-26 DIAGNOSIS — M48061 Spinal stenosis, lumbar region without neurogenic claudication: Secondary | ICD-10-CM | POA: Diagnosis present

## 2012-02-26 DIAGNOSIS — Z903 Acquired absence of stomach [part of]: Secondary | ICD-10-CM

## 2012-02-26 DIAGNOSIS — K219 Gastro-esophageal reflux disease without esophagitis: Secondary | ICD-10-CM | POA: Diagnosis present

## 2012-02-26 DIAGNOSIS — M6530 Trigger finger, unspecified finger: Secondary | ICD-10-CM | POA: Diagnosis present

## 2012-02-26 DIAGNOSIS — E039 Hypothyroidism, unspecified: Secondary | ICD-10-CM | POA: Diagnosis present

## 2012-02-26 DIAGNOSIS — E7800 Pure hypercholesterolemia, unspecified: Secondary | ICD-10-CM | POA: Diagnosis present

## 2012-02-26 DIAGNOSIS — Z853 Personal history of malignant neoplasm of breast: Secondary | ICD-10-CM

## 2012-02-26 DIAGNOSIS — I252 Old myocardial infarction: Secondary | ICD-10-CM

## 2012-02-26 DIAGNOSIS — E119 Type 2 diabetes mellitus without complications: Secondary | ICD-10-CM | POA: Diagnosis present

## 2012-02-26 DIAGNOSIS — I10 Essential (primary) hypertension: Secondary | ICD-10-CM | POA: Diagnosis present

## 2012-02-26 DIAGNOSIS — Z96659 Presence of unspecified artificial knee joint: Secondary | ICD-10-CM

## 2012-02-26 DIAGNOSIS — I2510 Atherosclerotic heart disease of native coronary artery without angina pectoris: Secondary | ICD-10-CM | POA: Diagnosis present

## 2012-02-26 DIAGNOSIS — Z9861 Coronary angioplasty status: Secondary | ICD-10-CM

## 2012-02-26 DIAGNOSIS — M4310 Spondylolisthesis, site unspecified: Secondary | ICD-10-CM | POA: Diagnosis present

## 2012-02-26 DIAGNOSIS — S98139A Complete traumatic amputation of one unspecified lesser toe, initial encounter: Secondary | ICD-10-CM

## 2012-02-26 DIAGNOSIS — Z90710 Acquired absence of both cervix and uterus: Secondary | ICD-10-CM

## 2012-02-26 DIAGNOSIS — M1711 Unilateral primary osteoarthritis, right knee: Secondary | ICD-10-CM | POA: Diagnosis present

## 2012-02-26 DIAGNOSIS — K449 Diaphragmatic hernia without obstruction or gangrene: Secondary | ICD-10-CM | POA: Diagnosis present

## 2012-02-26 DIAGNOSIS — M1710 Unilateral primary osteoarthritis, unspecified knee: Principal | ICD-10-CM | POA: Diagnosis present

## 2012-02-26 DIAGNOSIS — F17200 Nicotine dependence, unspecified, uncomplicated: Secondary | ICD-10-CM | POA: Diagnosis present

## 2012-02-26 DIAGNOSIS — M2010 Hallux valgus (acquired), unspecified foot: Secondary | ICD-10-CM | POA: Diagnosis present

## 2012-02-26 DIAGNOSIS — Z79899 Other long term (current) drug therapy: Secondary | ICD-10-CM

## 2012-02-26 DIAGNOSIS — Z7982 Long term (current) use of aspirin: Secondary | ICD-10-CM

## 2012-02-26 HISTORY — PX: TOTAL KNEE ARTHROPLASTY: SHX125

## 2012-02-26 LAB — TYPE AND SCREEN
ABO/RH(D): A POS
Antibody Screen: NEGATIVE

## 2012-02-26 LAB — GLUCOSE, CAPILLARY
Glucose-Capillary: 149 mg/dL — ABNORMAL HIGH (ref 70–99)
Glucose-Capillary: 153 mg/dL — ABNORMAL HIGH (ref 70–99)

## 2012-02-26 SURGERY — ARTHROPLASTY, KNEE, TOTAL
Anesthesia: General | Site: Knee | Laterality: Right | Wound class: Clean

## 2012-02-26 MED ORDER — ONDANSETRON HCL 4 MG PO TABS: 4.0000 mg | ORAL_TABLET | Freq: Four times a day (QID) | ORAL | Status: DC | PRN

## 2012-02-26 MED ORDER — THROMBIN 5000 UNITS EX SOLR
CUTANEOUS | Status: AC
Start: 2012-02-26 — End: 2012-02-26
  Filled 2012-02-26: qty 5000

## 2012-02-26 MED ORDER — INSULIN ASPART 100 UNIT/ML ~~LOC~~ SOLN
0.0000 [IU] | Freq: Three times a day (TID) | SUBCUTANEOUS | Status: DC
  Administered 2012-02-27 (×2): 3 [IU] via SUBCUTANEOUS
  Administered 2012-02-27: 2 [IU] via SUBCUTANEOUS
  Administered 2012-02-28: 3 [IU] via SUBCUTANEOUS
  Administered 2012-02-28 (×2): 2 [IU] via SUBCUTANEOUS

## 2012-02-26 MED ORDER — HYDROCHLOROTHIAZIDE 25 MG PO TABS
25.0000 mg | ORAL_TABLET | Freq: Every day | ORAL | Status: DC
  Administered 2012-02-26 – 2012-02-29 (×4): 25 mg via ORAL
  Filled 2012-02-26 (×4): qty 1

## 2012-02-26 MED ORDER — GLYCOPYRROLATE 0.2 MG/ML IJ SOLN
INTRAMUSCULAR | Status: DC | PRN
  Administered 2012-02-26: 0.4 mg via INTRAVENOUS

## 2012-02-26 MED ORDER — FLEET ENEMA 7-19 GM/118ML RE ENEM: 1.0000 | ENEMA | Freq: Once | RECTAL | Status: AC | PRN

## 2012-02-26 MED ORDER — LIDOCAINE HCL (CARDIAC) 20 MG/ML IV SOLN
INTRAVENOUS | Status: DC | PRN
  Administered 2012-02-26: 50 mg via INTRAVENOUS

## 2012-02-26 MED ORDER — ATROPINE SULFATE 0.4 MG/ML IJ SOLN
INTRAMUSCULAR | Status: DC | PRN
  Administered 2012-02-26: .6 mg via INTRAVENOUS

## 2012-02-26 MED ORDER — CEFAZOLIN SODIUM 1-5 GM-% IV SOLN
1.0000 g | Freq: Four times a day (QID) | INTRAVENOUS | Status: AC
  Administered 2012-02-26 – 2012-02-27 (×2): 1 g via INTRAVENOUS
  Filled 2012-02-26 (×2): qty 50

## 2012-02-26 MED ORDER — ACETAMINOPHEN 10 MG/ML IV SOLN
INTRAVENOUS | Status: DC | PRN
  Administered 2012-02-26: 1000 mg via INTRAVENOUS

## 2012-02-26 MED ORDER — METHOCARBAMOL 100 MG/ML IJ SOLN
500.0000 mg | Freq: Four times a day (QID) | INTRAVENOUS | Status: DC | PRN
  Administered 2012-02-27: 500 mg via INTRAVENOUS
  Filled 2012-02-26: qty 5

## 2012-02-26 MED ORDER — SODIUM CHLORIDE 0.9 % IJ SOLN
INTRAMUSCULAR | Status: DC | PRN
  Administered 2012-02-26: 20 mL

## 2012-02-26 MED ORDER — METHOCARBAMOL 500 MG PO TABS: 500.0000 mg | ORAL_TABLET | Freq: Four times a day (QID) | ORAL | Status: DC | PRN

## 2012-02-26 MED ORDER — ACETAMINOPHEN 650 MG RE SUPP: 650.0000 mg | Freq: Four times a day (QID) | RECTAL | Status: DC | PRN

## 2012-02-26 MED ORDER — ACETAMINOPHEN 325 MG PO TABS: 650.0000 mg | ORAL_TABLET | Freq: Four times a day (QID) | ORAL | Status: DC | PRN

## 2012-02-26 MED ORDER — MENTHOL 3 MG MT LOZG: 1.0000 | LOZENGE | OROMUCOSAL | Status: DC | PRN

## 2012-02-26 MED ORDER — HYDROMORPHONE HCL PF 1 MG/ML IJ SOLN
1.0000 mg | INTRAMUSCULAR | Status: DC | PRN
  Administered 2012-02-27 – 2012-02-28 (×4): 1 mg via INTRAVENOUS
  Filled 2012-02-26 (×4): qty 1

## 2012-02-26 MED ORDER — PHENOL 1.4 % MT LIQD: 1.0000 | OROMUCOSAL | Status: DC | PRN

## 2012-02-26 MED ORDER — THROMBIN 5000 UNITS EX SOLR
CUTANEOUS | Status: DC | PRN
  Administered 2012-02-26: 5000 [IU] via TOPICAL

## 2012-02-26 MED ORDER — ONDANSETRON HCL 4 MG/2ML IJ SOLN
INTRAMUSCULAR | Status: DC | PRN
  Administered 2012-02-26 (×2): 2 mg via INTRAVENOUS

## 2012-02-26 MED ORDER — POLYETHYLENE GLYCOL 3350 17 G PO PACK
17.0000 g | PACK | Freq: Every day | ORAL | Status: DC | PRN
  Filled 2012-02-26: qty 1

## 2012-02-26 MED ORDER — OXYCODONE-ACETAMINOPHEN 5-325 MG PO TABS: 1.0000 | ORAL_TABLET | ORAL | Status: DC | PRN

## 2012-02-26 MED ORDER — ONDANSETRON HCL 4 MG/2ML IJ SOLN
4.0000 mg | Freq: Four times a day (QID) | INTRAMUSCULAR | Status: DC | PRN
  Administered 2012-02-27: 4 mg via INTRAVENOUS
  Filled 2012-02-26: qty 2

## 2012-02-26 MED ORDER — RIVAROXABAN 10 MG PO TABS
10.0000 mg | ORAL_TABLET | Freq: Every day | ORAL | Status: DC
  Administered 2012-02-27 – 2012-02-29 (×3): 10 mg via ORAL
  Filled 2012-02-26 (×5): qty 1

## 2012-02-26 MED ORDER — EPHEDRINE SULFATE 50 MG/ML IJ SOLN
INTRAMUSCULAR | Status: DC | PRN
  Administered 2012-02-26: 5 mg via INTRAVENOUS
  Administered 2012-02-26: 10 mg via INTRAVENOUS

## 2012-02-26 MED ORDER — ATORVASTATIN CALCIUM 40 MG PO TABS
40.0000 mg | ORAL_TABLET | Freq: Every day | ORAL | Status: DC
  Administered 2012-02-26 – 2012-02-29 (×4): 40 mg via ORAL
  Filled 2012-02-26 (×4): qty 1

## 2012-02-26 MED ORDER — FENTANYL CITRATE 0.05 MG/ML IJ SOLN
INTRAMUSCULAR | Status: DC | PRN
  Administered 2012-02-26 (×5): 50 ug via INTRAVENOUS

## 2012-02-26 MED ORDER — HYDROMORPHONE HCL PF 1 MG/ML IJ SOLN
INTRAMUSCULAR | Status: DC | PRN
  Administered 2012-02-26 (×2): 0.5 mg via INTRAVENOUS
  Administered 2012-02-26: 1 mg via INTRAVENOUS

## 2012-02-26 MED ORDER — METOPROLOL TARTRATE 25 MG PO TABS
25.0000 mg | ORAL_TABLET | Freq: Once | ORAL | Status: AC
  Administered 2012-02-26: 25 mg via ORAL
  Filled 2012-02-26: qty 1

## 2012-02-26 MED ORDER — SODIUM CHLORIDE 0.9 % IR SOLN
Status: DC | PRN
  Administered 2012-02-26: 3000 mL

## 2012-02-26 MED ORDER — ALUM & MAG HYDROXIDE-SIMETH 200-200-20 MG/5ML PO SUSP: 30.0000 mL | ORAL | Status: DC | PRN

## 2012-02-26 MED ORDER — LEVOTHYROXINE SODIUM 50 MCG PO TABS
50.0000 ug | ORAL_TABLET | Freq: Every day | ORAL | Status: DC
  Administered 2012-02-27 – 2012-02-29 (×2): 50 ug via ORAL
  Filled 2012-02-26 (×4): qty 1

## 2012-02-26 MED ORDER — FERROUS SULFATE 325 (65 FE) MG PO TABS
325.0000 mg | ORAL_TABLET | Freq: Three times a day (TID) | ORAL | Status: DC
  Administered 2012-02-26 – 2012-02-29 (×8): 325 mg via ORAL
  Filled 2012-02-26 (×11): qty 1

## 2012-02-26 MED ORDER — CISATRACURIUM BESYLATE (PF) 10 MG/5ML IV SOLN
INTRAVENOUS | Status: DC | PRN
  Administered 2012-02-26: 10 mg via INTRAVENOUS

## 2012-02-26 MED ORDER — PROMETHAZINE HCL 25 MG/ML IJ SOLN
INTRAMUSCULAR | Status: AC
  Filled 2012-02-26: qty 1

## 2012-02-26 MED ORDER — CEFAZOLIN SODIUM-DEXTROSE 2-3 GM-% IV SOLR: 2.0000 g | INTRAVENOUS | Status: DC

## 2012-02-26 MED ORDER — HYDROCODONE-ACETAMINOPHEN 5-325 MG PO TABS
1.0000 | ORAL_TABLET | ORAL | Status: DC | PRN
  Administered 2012-02-27 (×2): 1 via ORAL
  Administered 2012-02-27 – 2012-02-28 (×3): 2 via ORAL
  Administered 2012-02-29: 1 via ORAL
  Filled 2012-02-26: qty 1
  Filled 2012-02-26 (×3): qty 2
  Filled 2012-02-26: qty 1
  Filled 2012-02-26: qty 2

## 2012-02-26 MED ORDER — PROPOFOL 10 MG/ML IV EMUL
INTRAVENOUS | Status: DC | PRN
  Administered 2012-02-26: 80 mg via INTRAVENOUS

## 2012-02-26 MED ORDER — BUPIVACAINE LIPOSOME 1.3 % IJ SUSP
20.0000 mL | Freq: Once | INTRAMUSCULAR | Status: AC
  Administered 2012-02-26: 20 mL
  Filled 2012-02-26: qty 20

## 2012-02-26 MED ORDER — HYDROMORPHONE HCL PF 1 MG/ML IJ SOLN: 0.2500 mg | INTRAMUSCULAR | Status: DC | PRN

## 2012-02-26 MED ORDER — METFORMIN HCL ER 500 MG PO TB24
500.0000 mg | ORAL_TABLET | Freq: Every day | ORAL | Status: DC
  Administered 2012-02-27 – 2012-02-29 (×3): 500 mg via ORAL
  Filled 2012-02-26 (×5): qty 1

## 2012-02-26 MED ORDER — NEOSTIGMINE METHYLSULFATE 1 MG/ML IJ SOLN
INTRAMUSCULAR | Status: DC | PRN
  Administered 2012-02-26: 3 mg via INTRAVENOUS

## 2012-02-26 MED ORDER — PROMETHAZINE HCL 25 MG/ML IJ SOLN
6.2500 mg | INTRAMUSCULAR | Status: DC | PRN
  Administered 2012-02-26: 12.5 mg via INTRAVENOUS

## 2012-02-26 MED ORDER — POLYMYXIN B SULFATE 500000 UNITS IJ SOLR
INTRAMUSCULAR | Status: DC | PRN
  Administered 2012-02-26: 14:00:00

## 2012-02-26 MED ORDER — BISACODYL 10 MG RE SUPP: 10.0000 mg | Freq: Every day | RECTAL | Status: DC | PRN

## 2012-02-26 MED ORDER — LACTATED RINGERS IV SOLN
INTRAVENOUS | Status: DC
  Administered 2012-02-26: 1000 mL via INTRAVENOUS

## 2012-02-26 MED ORDER — LACTATED RINGERS IV SOLN
INTRAVENOUS | Status: DC | PRN
  Administered 2012-02-26 (×2): via INTRAVENOUS

## 2012-02-26 MED ORDER — METOPROLOL TARTRATE 25 MG PO TABS
25.0000 mg | ORAL_TABLET | Freq: Every day | ORAL | Status: DC
  Administered 2012-02-27 – 2012-02-29 (×3): 25 mg via ORAL
  Filled 2012-02-26 (×4): qty 1

## 2012-02-26 MED ORDER — LACTATED RINGERS IV SOLN
INTRAVENOUS | Status: DC
  Administered 2012-02-26: 1000 mL via INTRAVENOUS
  Administered 2012-02-27 (×2): via INTRAVENOUS

## 2012-02-26 SURGICAL SUPPLY — 66 items
BAG SPEC THK2 15X12 ZIP CLS (MISCELLANEOUS) ×1
BAG ZIPLOCK 12X15 (MISCELLANEOUS) ×2 IMPLANT
BANDAGE ELASTIC 4 VELCRO ST LF (GAUZE/BANDAGES/DRESSINGS) ×2 IMPLANT
BANDAGE ELASTIC 6 VELCRO ST LF (GAUZE/BANDAGES/DRESSINGS) ×2 IMPLANT
BANDAGE ESMARK 6X9 LF (GAUZE/BANDAGES/DRESSINGS) ×1 IMPLANT
BANDAGE GAUZE ELAST BULKY 4 IN (GAUZE/BANDAGES/DRESSINGS) ×2 IMPLANT
BLADE SAG 18X100X1.27 (BLADE) ×2 IMPLANT
BLADE SAW SGTL 11.0X1.19X90.0M (BLADE) ×2 IMPLANT
BNDG CMPR 9X6 STRL LF SNTH (GAUZE/BANDAGES/DRESSINGS) ×1
BNDG ESMARK 6X9 LF (GAUZE/BANDAGES/DRESSINGS) ×2
BONE CEMENT GENTAMICIN (Cement) ×4 IMPLANT
CEMENT BONE GENTAMICIN 40 (Cement) ×2 IMPLANT
CLOTH BEACON ORANGE TIMEOUT ST (SAFETY) ×2 IMPLANT
CUFF TOURN SGL QUICK 34 (TOURNIQUET CUFF) ×2
CUFF TRNQT CYL 34X4X40X1 (TOURNIQUET CUFF) ×1 IMPLANT
DRAPE EXTREMITY T 121X128X90 (DRAPE) ×2 IMPLANT
DRAPE INCISE IOBAN 66X45 STRL (DRAPES) ×2 IMPLANT
DRAPE LG THREE QUARTER DISP (DRAPES) ×2 IMPLANT
DRAPE POUCH INSTRU U-SHP 10X18 (DRAPES) ×2 IMPLANT
DRAPE U-SHAPE 47X51 STRL (DRAPES) ×2 IMPLANT
DRSG ADAPTIC 3X8 NADH LF (GAUZE/BANDAGES/DRESSINGS) ×2 IMPLANT
DRSG PAD ABDOMINAL 8X10 ST (GAUZE/BANDAGES/DRESSINGS) ×5 IMPLANT
DURAPREP 26ML APPLICATOR (WOUND CARE) ×2 IMPLANT
ELECT REM PT RETURN 9FT ADLT (ELECTROSURGICAL) ×2
ELECTRODE REM PT RTRN 9FT ADLT (ELECTROSURGICAL) ×1 IMPLANT
EVACUATOR 1/8 PVC DRAIN (DRAIN) ×2 IMPLANT
FACESHIELD LNG OPTICON STERILE (SAFETY) ×12 IMPLANT
GLOVE BIOGEL PI IND STRL 8 (GLOVE) ×1 IMPLANT
GLOVE BIOGEL PI IND STRL 8.5 (GLOVE) IMPLANT
GLOVE BIOGEL PI INDICATOR 8 (GLOVE) ×1
GLOVE BIOGEL PI INDICATOR 8.5 (GLOVE)
GLOVE ECLIPSE 8.0 STRL XLNG CF (GLOVE) ×4 IMPLANT
GLOVE SURG SS PI 6.5 STRL IVOR (GLOVE) ×4 IMPLANT
GOWN PREVENTION PLUS LG XLONG (DISPOSABLE) ×4 IMPLANT
GOWN STRL REIN XL XLG (GOWN DISPOSABLE) ×2 IMPLANT
HANDPIECE INTERPULSE COAX TIP (DISPOSABLE) ×2
IMMOBILIZER KNEE 20 (SOFTGOODS) ×2
IMMOBILIZER KNEE 20 THIGH 36 (SOFTGOODS) IMPLANT
KIT BASIN OR (CUSTOM PROCEDURE TRAY) ×2 IMPLANT
MANIFOLD NEPTUNE II (INSTRUMENTS) ×2 IMPLANT
NEEDLE HYPO 22GX1.5 SAFETY (NEEDLE) ×2 IMPLANT
NS IRRIG 1000ML POUR BTL (IV SOLUTION) ×2 IMPLANT
PACK TOTAL JOINT (CUSTOM PROCEDURE TRAY) ×2 IMPLANT
POSITIONER SURGICAL ARM (MISCELLANEOUS) ×2 IMPLANT
SET HNDPC FAN SPRY TIP SCT (DISPOSABLE) ×1 IMPLANT
SET PAD KNEE POSITIONER (MISCELLANEOUS) ×2 IMPLANT
SPONGE GAUZE 4X4 12PLY (GAUZE/BANDAGES/DRESSINGS) ×1 IMPLANT
SPONGE LAP 18X18 X RAY DECT (DISPOSABLE) ×2 IMPLANT
SPONGE SURGIFOAM ABS GEL 100 (HEMOSTASIS) ×2 IMPLANT
STAPLER VISISTAT 35W (STAPLE) IMPLANT
STRIP CLOSURE SKIN 1/2X4 (GAUZE/BANDAGES/DRESSINGS) ×1 IMPLANT
SUCTION FRAZIER 12FR DISP (SUCTIONS) ×2 IMPLANT
SUT BONE WAX W31G (SUTURE) ×2 IMPLANT
SUT VIC AB 0 CT1 27 (SUTURE) ×4
SUT VIC AB 0 CT1 27XBRD ANTBC (SUTURE) ×2 IMPLANT
SUT VIC AB 1 CT1 27 (SUTURE) ×10
SUT VIC AB 1 CT1 27XBRD ANTBC (SUTURE) ×5 IMPLANT
SUT VIC AB 2-0 CT1 27 (SUTURE) ×6
SUT VIC AB 2-0 CT1 TAPERPNT 27 (SUTURE) ×3 IMPLANT
SUT VLOC 180 0 24IN GS25 (SUTURE) ×1 IMPLANT
SYR 20CC LL (SYRINGE) ×2 IMPLANT
TOWEL OR 17X26 10 PK STRL BLUE (TOWEL DISPOSABLE) ×4 IMPLANT
TOWER CARTRIDGE SMART MIX (DISPOSABLE) ×2 IMPLANT
TRAY FOLEY CATH 14FRSI W/METER (CATHETERS) ×2 IMPLANT
WATER STERILE IRR 1500ML POUR (IV SOLUTION) ×2 IMPLANT
WRAP KNEE MAXI GEL POST OP (GAUZE/BANDAGES/DRESSINGS) ×4 IMPLANT

## 2012-02-26 NOTE — Interval H&P Note (Signed)
History and Physical Interval Note:  02/26/2012 12:29 PM  Michelle [name]  has presented today for surgery, with the diagnosis of Osteoarthritis Right Knee  The various methods of treatment have been discussed with the patient and family. After consideration of risks, benefits and other options for treatment, the patient has consented to  Procedure(s) (LRB) with comments: TOTAL KNEE ARTHROPLASTY (Right) as a surgical intervention .  The patient's history has been reviewed, patient examined, no change in status, stable for surgery.  I have reviewed the patient's chart and labs.  Questions were answered to the patient's satisfaction.     Laruen Risser A

## 2012-02-26 NOTE — H&P (View-Only) (Signed)
Pt's son insists on going to OR holding area to speak to Dr Gioffre. Called [facility] and received permission for son to accompany pt to OR holding. Son updated on this. 

## 2012-02-26 NOTE — Transfer of Care (Signed)
Immediate Anesthesia Transfer of Care Note  Patient: Michelle [name]  Procedure(s) Performed: Procedure(s) (LRB) with comments: TOTAL KNEE ARTHROPLASTY (Right)  Patient Location: PACU  Anesthesia Type: General  Level of Consciousness: awake, alert , oriented and patient cooperative  Airway & Oxygen Therapy: Patient Spontanous Breathing and Patient connected to face mask oxygen  Post-op Assessment: Report given to PACU RN, Post -op Vital signs reviewed and stable and Patient moving all extremities  Post vital signs: Reviewed and stable  Complications: No apparent anesthesia complications

## 2012-02-26 NOTE — Anesthesia Postprocedure Evaluation (Signed)
  Anesthesia Post-op Note  Patient: Michelle [name]  Procedure(s) Performed: Procedure(s) (LRB): TOTAL KNEE ARTHROPLASTY (Right)  Patient Location: PACU  Anesthesia Type: General  Level of Consciousness: awake and alert   Airway and Oxygen Therapy: Patient Spontanous Breathing  Post-op Pain: mild  Post-op Assessment: Post-op Vital signs reviewed, Patient's Cardiovascular Status Stable, Respiratory Function Stable, Patent Airway and No signs of Nausea or vomiting  Post-op Vital Signs: stable  Complications: No apparent anesthesia complications

## 2012-02-26 NOTE — Progress Notes (Signed)
Pt's son insists on going to OR holding area to speak to Dr Darrelyn Hillock. [facility] and received permission for son to accompany pt to OR holding. Son updated on this.

## 2012-02-26 NOTE — Preoperative (Signed)
Beta Blockers   Reason not to administer Beta Blockers:Beta blocker taken this am 02-26-12 by pt

## 2012-02-26 NOTE — Anesthesia Preprocedure Evaluation (Addendum)
Anesthesia Evaluation  Patient identified by MRN, date of birth, ID band Patient awake  General Assessment Comment:No versed  Reviewed: Allergy & Precautions, H&P , NPO status , Patient's Chart, lab work & pertinent test results  Airway Mallampati: II TM Distance: <3 FB Neck ROM: Limited    Dental No notable dental hx.    Pulmonary neg pulmonary ROS,  breath sounds clear to auscultation  Pulmonary exam normal       Cardiovascular hypertension, Pt. on medications + CAD and + Cardiac Stents Rhythm:Regular Rate:Normal     Neuro/Psych negative neurological ROS  negative psych ROS   GI/Hepatic negative GI ROS, Neg liver ROS,   Endo/Other  diabetes, Type 2  Renal/GU negative Renal ROS  negative genitourinary   Musculoskeletal negative musculoskeletal ROS (+)   Abdominal   Peds negative pediatric ROS (+)  Hematology negative hematology ROS (+)   Anesthesia Other Findings   Reproductive/Obstetrics negative OB ROS                         Anesthesia Physical Anesthesia Plan  ASA: III  Anesthesia Plan: General   Post-op Pain Management:    Induction: Intravenous  Airway Management Planned: Oral ETT and LMA  Additional Equipment:   Intra-op Plan:   Post-operative Plan:   Informed Consent: I have reviewed the patients History and Physical, chart, labs and discussed the procedure including the risks, benefits and alternatives for the proposed anesthesia with the patient or authorized representative who has indicated his/her understanding and acceptance.   Dental advisory given  Plan Discussed with: CRNA and Surgeon  Anesthesia Plan Comments:         Anesthesia Quick Evaluation

## 2012-02-26 NOTE — Brief Op Note (Signed)
02/26/2012  3:37 PM  PATIENT:  Michelle [name]  78 y.o. female  PRE-OPERATIVE DIAGNOSIS:  Osteoarthritis Right Knee  POST-OPERATIVE DIAGNOSIS:  Osteoarthritis Right Knee  PROCEDURE:  Procedure(s) (LRB) with comments: TOTAL KNEE ARTHROPLASTY (Right)  SURGEON:  Surgeon(s) and Role:    * Jacki Cones, MD - Primary  PHYSICIAN ASSISTANT: Amber Constabl   ANESTHESIA:   general  EBL:  Total I/O In: 1000 [I.V.:1000] Out: 275 [Urine:175; Other:100]  BLOOD ADMINISTERED:none  DRAINS: (one) Hemovact drain(s) in the Right with  Suction Open   LOCAL MEDICATIONS USED:  BUPIVICAINE 20cc mixed with 20cc Normal Saline SPECIMEN:  No Specimen  DISPOSITION OF SPECIMEN:  N/A  COUNTS:  YES  TOURNIQUET:  * Missing tourniquet times found for documented tourniquets in log:  40981 *  DICTATION: .Other Dictation: Dictation Number [id number]  PLAN OF CARE: Admit to inpatient   PATIENT DISPOSITION:  Stable in OR   Delay start of Pharmacological VTE agent (>24hrs) due to surgical blood loss or risk of bleeding: yes

## 2012-02-27 ENCOUNTER — Encounter (HOSPITAL_COMMUNITY): Payer: Self-pay | Admitting: Orthopedic Surgery

## 2012-02-27 LAB — CBC
HCT: 28.1 % — ABNORMAL LOW (ref 36.0–46.0)
Hemoglobin: 9.4 g/dL — ABNORMAL LOW (ref 12.0–15.0)
MCH: 30.1 pg (ref 26.0–34.0)
MCHC: 33.5 g/dL (ref 30.0–36.0)

## 2012-02-27 LAB — BASIC METABOLIC PANEL
BUN: 8 mg/dL (ref 6–23)
Calcium: 8.2 mg/dL — ABNORMAL LOW (ref 8.4–10.5)
GFR calc non Af Amer: 86 mL/min — ABNORMAL LOW (ref >90)
Glucose, Bld: 151 mg/dL — ABNORMAL HIGH (ref 70–99)

## 2012-02-27 LAB — GLUCOSE, CAPILLARY: Glucose-Capillary: 156 mg/dL — ABNORMAL HIGH (ref 70–99)

## 2012-02-27 NOTE — Progress Notes (Signed)
Utilization review completed.  

## 2012-02-27 NOTE — Progress Notes (Signed)
   Subjective: 1 Day Post-Op Procedure(s) (LRB): TOTAL KNEE ARTHROPLASTY (Right) Patient reports pain as moderate.   Patient seen in rounds with Dr. Darrelyn Hillock. Patient is well, but has had some minor complaints of dizziness. She is having a lot of pain today and did not sleep much last night as a result. She denies chest pain and shortness of breath. Plan is to go Skilled nursing facility after hospital stay.  Objective: Vital signs in last 24 hours: Temp:  [97.7 F (36.5 C)-98.9 F (37.2 C)] 98.9 F (37.2 C) (10/03 0630) Pulse Rate:  [44-70] 62  (10/03 0630) Resp:  [11-22] 20  (10/03 0630) BP: (107-151)/(45-84) 110/45 mmHg (10/03 0630) SpO2:  [95 %-100 %] 98 % (10/03 0630)  Intake/Output from previous day:  Intake/Output Summary (Last 24 hours) at 02/27/12 0732 Last data filed at 02/27/12 0700  Gross per 24 hour  Intake 3726.67 ml  Output   1530 ml  Net 2196.67 ml     Labs:  Basename 02/27/12 0504  HGB 9.4*    Basename 02/27/12 0504  WBC 5.4  RBC 3.12*  HCT 28.1*  PLT 173    Basename 02/27/12 0504  NA 135  K 3.4*  CL 101  CO2 26  BUN 8  CREATININE 0.54  GLUCOSE 151*  CALCIUM 8.2*   EXAM General - Patient is Alert and Oriented Extremity - Neurologically intact Neurovascular intact Dorsiflexion/Plantar flexion intact Dressing - dressing C/D/I Motor Function - intact, moving foot and toes well on exam.  Hemovac pulled without difficulty.  Past Medical History  Diagnosis Date  . Hypertension   . Coronary artery disease   . Hypothyroidism   . Diabetes mellitus   . GERD (gastroesophageal reflux disease)   . Arthritis   . Myocardial infarction 2008,2010    with stents    Assessment/Plan: 1 Day Post-Op Procedure(s) (LRB): TOTAL KNEE ARTHROPLASTY (Right) Active Problems:  Osteoarthritis of right knee   Advance diet Up with therapy Discharge to SNF tentative Saturday DVT Prophylaxis - Xarelto Weight-Bearing as tolerated to right leg  We  discussed that she needs to stay in front of the pain with her medicine. It is ordered PRN but she may need to take it on a more scheduled basis for the next couple days. We are going to recheck her Hgb tomorrow. If she continues to be symptomatic and it drops much more we may need to do a blood transfusion.   Aasiyah Auerbach LAUREN 02/27/2012, 7:32 AM

## 2012-02-27 NOTE — Progress Notes (Signed)
OT Note: Pt plans [facility]SNF.  Will defer OT eval to that venue.  [place], OTR/L [phone] 02/27/2012

## 2012-02-27 NOTE — Evaluation (Signed)
Physical Therapy Evaluation Patient Details Name: Michelle [name] MRN: [id number] DOB: 03/08/1928 Today's Date: 02/27/2012 Time: 2130-8657 PT Time Calculation (min): 23 min  PT Assessment / Plan / Recommendation Clinical Impression  Pt presents s/p R TKA POD 1 with decreased strength, ROM and mobility.  Pt with history of R 2nd toe amp and bunion removal.  Tolerated OOB and took some steps from bed towards door, however pt fatigued and had increased pain, therefore assisted to chair.  Pt will benefit from skilled PT in acute venue to address defictis.  PT recommends SNF for folllow up therapy at D/C to increase pt safety and maximize independence.      PT Assessment  Patient needs continued PT services    Follow Up Recommendations  Post acute inpatient rehab    Barriers to Discharge None      Equipment Recommendations  3 in 1 bedside comode    Recommendations for Other Services OT consult   Frequency 7X/week    Precautions / Restrictions Precautions Precautions: Knee Required Braces or Orthoses: Knee Immobilizer - Right Knee Immobilizer - Right: Discontinue once straight leg raise with < 10 degree lag Restrictions Weight Bearing Restrictions: No Other Position/Activity Restrictions: WBAT   Pertinent Vitals/Pain 7/10, ice packs applied.       Mobility  Bed Mobility Bed Mobility: Supine to Sit;Sitting - Scoot to Edge of Bed Supine to Sit: 4: Min assist Sitting - Scoot to [place] of Bed: 4: Min assist Details for Bed Mobility Assistance: Assist for RLE out of bed with cues for UE placement to self assist trunk to EOB.  Transfers Transfers: Sit to Stand;Stand to Sit Sit to Stand: From elevated surface;With upper extremity assist;From bed;1: +2 Total assist Sit to Stand: Patient Percentage: 60% Stand to Sit: 4: Min guard;With upper extremity assist;To chair/3-in-1;1: +2 Total assist Stand to Sit: Patient Percentage: 60% Details for Transfer Assistance: Assist to rise and steady  with cues for hand placement and LE management when sitting/standing.  Ambulation/Gait Ambulation/Gait Assistance: 1: +2 Total assist Ambulation/Gait: Patient Percentage: 60% Ambulation Distance (Feet): 10 Feet Assistive device: Rolling walker Ambulation/Gait Assistance Details: Assist to maintain upright stance and steady with cues for sequencing/technique with RW and to maintain upright posture.  Gait Pattern: Step-to pattern;Trunk flexed;Decreased stride length;Decreased stance time - right;Decreased step length - left;Narrow base of support Gait velocity: decreased Stairs: No Wheelchair Mobility Wheelchair Mobility: No    Shoulder Instructions     Exercises     PT Diagnosis: Difficulty walking;Generalized weakness;Acute pain  PT Problem List: Decreased strength;Decreased range of motion;Decreased activity tolerance;Decreased balance;Decreased mobility;Decreased knowledge of use of DME;Pain PT Treatment Interventions: DME instruction;Gait training;Functional mobility training;Therapeutic activities;Therapeutic exercise;Balance training;Patient/family education   PT Goals Acute Rehab PT Goals PT Goal Formulation: With patient Time For Goal Achievement: 03/05/12 Potential to Achieve Goals: Good Pt will go Supine/Side to Sit: with supervision PT Goal: Supine/Side to Sit - Progress: Goal set today Pt will go Sit to Supine/Side: with supervision PT Goal: Sit to Supine/Side - Progress: Goal set today Pt will go Sit to Stand: with supervision PT Goal: Sit to Stand - Progress: Goal set today Pt will go Stand to Sit: with supervision PT Goal: Stand to Sit - Progress: Goal set today Pt will Ambulate: with least restrictive assistive device;51 - 150 feet;with supervision PT Goal: Ambulate - Progress: Goal set today Pt will Perform Home Exercise Program: with supervision, verbal cues required/provided PT Goal: Perform Home Exercise Program - Progress: Goal set today  Visit Information   Last PT Received On: 02/27/12 Assistance Needed: +2 (for safety. )    Subjective Data  Subjective: I'm not able to move at all Patient Stated Goal: to get back home after rehab   Prior Functioning  Home Living Lives With: Alone Available Help at Discharge: Skilled Nursing Facility Type of Home: House Home Access: Level entry;Ramped entrance Bathroom Shower/Tub: Walk-in shower Home Adaptive Equipment: Wheelchair - manual;Walker - rolling;Straight cane Additional Comments: Pt will be going SNF following acute stay.  Prior Function Level of Independence: Independent with assistive device(s) Able to Take Stairs?: No Driving: No Vocation: Retired Musician: No difficulties    Cognition  Overall Cognitive Status: Appears within functional limits for tasks assessed/performed Arousal/Alertness: Awake/alert Orientation Level: Appears intact for tasks assessed Behavior During Session: [facility] for tasks performed    Extremity/Trunk Assessment Right Lower Extremity Assessment RLE ROM/Strength/Tone: Deficits RLE ROM/Strength/Tone Deficits: ankle motions WFL, able to initiate SLR, however requires assist against gravity.   RLE Sensation: WFL - Light Touch RLE Coordination: WFL - gross/fine motor Left Lower Extremity Assessment LLE ROM/Strength/Tone: WFL for tasks assessed LLE Sensation: WFL - Light Touch LLE Coordination: WFL - gross/fine motor Trunk Assessment Trunk Assessment: Kyphotic   Balance    End of Session PT - End of Session Equipment Utilized During Treatment: Right knee immobilizer Activity Tolerance: Patient limited by pain;Patient limited by fatigue Patient left: in chair;with call bell/phone within reach;with family/visitor present Nurse Communication: Mobility status  GP     Page, Meribeth Mattes 02/27/2012, 10:37 AM

## 2012-02-27 NOTE — Clinical Social Work Psychosocial (Unsigned)
     Clinical Social Work Department BRIEF PSYCHOSOCIAL ASSESSMENT 02/27/2012  Patient:  Michelle [name], Michelle [name]     Account Number:  [id number]     Admit date:  02/26/2012  Clinical Social Worker:  Hattie Perch  Date/Time:  02/27/2012 12:00 M  Referred by:  Physician  Date Referred:  02/27/2012 Referred for  SNF Placement   Other Referral:   Interview type:  Patient Other interview type:   daughter at bedside    PSYCHOSOCIAL DATA Living Status:  ALONE Admitted from facility:   Level of care:   Primary support name:  Michelle [name] Primary support relationship to patient:  CHILD, ADULT Degree of support available:   good    CURRENT CONCERNS Current Concerns  Post-Acute Placement  Post-Acute Placement   Other Concerns:    SOCIAL WORK ASSESSMENT / PLAN CSW met with patient. patient is alert and oriented X3. patient is pre registered at [facility] for snf upon discharge.   Assessment/plan status:   Other assessment/ plan:   Information/referral to community resources:    PATIENTS/FAMILYS RESPONSE TO PLAN OF CARE: agreeable to snf at [facility] upon discharge.

## 2012-02-27 NOTE — Op Note (Signed)
NAMEMarland Kitchen  KAYLANY, TESORIERO NO.:  [id number]  MEDICAL RECORD NO.:  [id number]  LOCATION:  1402                         FACILITY:  [facility]  PHYSICIAN:  Georges Lynch. Denisha Hoel, M.D.DATE OF BIRTH:  09/30/1927  DATE OF PROCEDURE:  02/26/2012 DATE OF DISCHARGE:                              OPERATIVE REPORT   SURGEON:  Georges Lynch. Darrelyn Hillock, M.D.  ASSISTANT:  Dimitri Ped, [place].  PREOPERATIVE DIAGNOSIS:  Severe degenerative arthritis, right knee with bone on bone and a severe flexion contracture.  POSTOPERATIVE DIAGNOSIS:  Severe degenerative arthritis, right knee with bone on bone and a severe flexion contracture.  OPERATION:  Complex right total knee arthroplasty utilizing the DePuy system.  All 3 components were cemented.  The femur was size 4.  Right posterior cruciate sacrificing femoral component.  Tibial tray was a size 4.  The insert was a size 4, 10 mm thickness.  The patella was a size 35 with 3 pegs, and gentamicin was used in the cement.  PROCEDURE:  Under general anesthesia, routine orthopedic prep and draping of the right lower extremity was carried out.  The appropriate time-out was carried out prior to making incision.  Also I marked the appropriate right leg in the holding area.  At this time, the leg was placed in the [facility] knee holder.  The leg was exsanguinated with an Esmarch and the tourniquet was elevated to 325 mmHg.  At this time, the knee was flexed and incision was made over the anterior aspect of the right knee.  Bleeders were identified and cauterized.  At this time, 2 flaps were created.  I then carried out a median parapatellar approach to the right knee.  Patella was reflected laterally.  The knee was flexed and I did medial and lateral meniscectomies and also I had to do an extensive medial release because of severe contracture and severe genu varus deformity.  After we did our releases, I then made my initial drill hole in the intercondylar  notch.  The canal finder was inserted up the canal and I thoroughly water picked out the canal.  I irrigated out the canal.  I then measured the femur to be a size 4 right.  I then inserted my next jig and did my anterior-posterior and chamfer cuts for a size 4 right femoral component.  Following that, I prepared the tibia in the usual fashion.  We had to remove 8 mm thickness off the affected medial side of the tibia because of the severe varus and severe contracture.  At this particular time, after we prepared our tibial plateau for a size 4 tibial plate, I then removed the posterior spurs from the femoral condyles, made sure we had a good release of soft tissue, and then utilized my spacer guide.  At this particular time, we had good flexion/extension.  I then continued to prepare the tibia, we utilized the intramedullary guide rod.  We did our keel cut.  Following that, I prepared the femur in the usual fashion by doing the notch cut. After this, we then applied our trial components, reduced the knee, held the knee in extension, and I did a resurfacing  procedure on the patella. Three drill holes were made in the patella for a size 35 patella.  After this, all trial components were removed.  I thoroughly water picked out the knee, dried the knee out, cemented all 3 components in simultaneously.  When the cement was hardened, I then removed all loose pieces of cement.  We went back posterior as well, thoroughly water picked out the knee, to make sure all loose pieces of cement were removed.  Following that, I then injected a mixture of 20 mL of Exparel with 20 mL of normal saline.  I used half of that mixture at the beginning and the other half was used at the end of the case.  At this time, I inserted some thrombin-soaked Gelfoam in the posterior aspect and the medial aspect of the knee.  Hemovac drain was inserted.  We finally inserted our 10 mm thickness permanent rotating platform,  size 4.  The knee was reduced, we had excellent motion.  Once again, we checked the knee for cement, there were no other loose pieces of cement. I then closed the knee in layers over the Hemovac drain.  Subcu was closed with 0-Vicryl, and then we used a subcutaneous closure.          ______________________________ Georges Lynch. Darrelyn Hillock, M.D.     RAG/MEDQ  D:  02/26/2012  T:  02/27/2012  Job:  [id number]

## 2012-02-27 NOTE — Progress Notes (Signed)
PT note:  Attempted to see pt for second visit, however pt with increased pain and states that her knee has been moved around all day.  Spoke with RN, states that pt has had increased confusion this afternoon.  Will check back in am tomorrow.   Thanks,  Clovia Cuff, PT [phone]

## 2012-02-28 LAB — GLUCOSE, CAPILLARY
Glucose-Capillary: 139 mg/dL — ABNORMAL HIGH (ref 70–99)
Glucose-Capillary: 139 mg/dL — ABNORMAL HIGH (ref 70–99)
Glucose-Capillary: 151 mg/dL — ABNORMAL HIGH (ref 70–99)
Glucose-Capillary: 120 mg/dL — ABNORMAL HIGH (ref 70–99)

## 2012-02-28 LAB — BASIC METABOLIC PANEL
CO2: 28 mEq/L (ref 19–32)
Calcium: 8.6 mg/dL (ref 8.4–10.5)
Creatinine, Ser: 0.59 mg/dL (ref 0.50–1.10)
Glucose, Bld: 127 mg/dL — ABNORMAL HIGH (ref 70–99)

## 2012-02-28 LAB — CBC
Hemoglobin: 8.6 g/dL — ABNORMAL LOW (ref 12.0–15.0)
MCH: 30.6 pg (ref 26.0–34.0)
MCV: 90 fL (ref 78.0–100.0)
RBC: 2.81 MIL/uL — ABNORMAL LOW (ref 3.87–5.11)

## 2012-02-28 MED ORDER — FERROUS SULFATE 325 (65 FE) MG PO TABS: 325.0000 mg | ORAL_TABLET | Freq: Three times a day (TID) | ORAL | Status: DC

## 2012-02-28 MED ORDER — METHOCARBAMOL 500 MG PO TABS: 500.0000 mg | ORAL_TABLET | Freq: Four times a day (QID) | ORAL | Status: DC | PRN

## 2012-02-28 MED ORDER — RIVAROXABAN 10 MG PO TABS: 10.0000 mg | ORAL_TABLET | Freq: Every day | ORAL | Status: DC

## 2012-02-28 MED ORDER — OXYCODONE-ACETAMINOPHEN 5-325 MG PO TABS: 1.0000 | ORAL_TABLET | ORAL | Status: DC | PRN

## 2012-02-28 MED ORDER — BISACODYL 10 MG RE SUPP: 10.0000 mg | Freq: Every day | RECTAL | Status: DC | PRN

## 2012-02-28 MED ORDER — POLYETHYLENE GLYCOL 3350 17 G PO PACK: 17.0000 g | PACK | Freq: Every day | ORAL | Status: DC | PRN

## 2012-02-28 NOTE — Care Management Note (Signed)
    Page 1 of 1   02/28/2012     3:52:41 PM   CARE MANAGEMENT NOTE 02/28/2012  Patient:  Michelle [name], Michelle [name]   Account Number:  [id number]  Date Initiated:  02/28/2012  Documentation initiated by:  Lanier Clam  Subjective/Objective Assessment:   ADMITTED W/OA R KNEE.     Action/Plan:   FROM HOME   Anticipated DC Date:  02/29/2012   Anticipated DC Plan:  SKILLED NURSING FACILITY  In-house referral  Clinical Social Worker      DC Planning Services  CM consult      Choice offered to / List presented to:             Status of service:  Completed, signed off Medicare Important Message given?   (If response is "NO", the following Medicare IM given date fields will be blank) Date Medicare IM given:   Date Additional Medicare IM given:    Discharge Disposition:  SKILLED NURSING FACILITY  Per UR Regulation:  Reviewed for med. necessity/level of care/duration of stay  If discussed at Long Length of Stay Meetings, dates discussed:    Comments:  02/28/12 Amaree Loisel RN,BSN NCM [phone] D/C SNF IF STABLE IN AM.

## 2012-02-28 NOTE — Progress Notes (Signed)
Discharge information sent to [facility]. Patient accepted for admission for tomorrow. Signed FL2 and all information in packet. Weekend CSW to set up transportation.  [name] C. Elfriede Bonini MSW, LCSW [phone]

## 2012-02-28 NOTE — Progress Notes (Signed)
Subjective: Much better today. Her Pulse rate has been fine since PACU. Hbg is 8.6 this morning.Will repeat in the morning.   Objective: Vital signs in last 24 hours: Temp:  [98.4 F (36.9 C)-99.3 F (37.4 C)] 99.3 F (37.4 C) (10/04 0511) Pulse Rate:  [61-84] 84  (10/04 0511) Resp:  [14-18] 16  (10/04 0511) BP: (94-115)/(47-52) 94/47 mmHg (10/04 0511) SpO2:  [92 %-98 %] 94 % (10/04 0511)  Intake/Output from previous day: 10/03 0701 - 10/04 0700 In: 3651.7 [P.O.:1370; I.V.:2281.7] Out: 2350 [Urine:2350] Intake/Output this shift:     Basename 02/28/12 0414 02/27/12 0504  HGB 8.6* 9.4*    Basename 02/28/12 0414 02/27/12 0504  WBC 7.1 5.4  RBC 2.81* 3.12*  HCT 25.3* 28.1*  PLT 147* 173    Basename 02/28/12 0414 02/27/12 0504  NA 136 135  K 3.0* 3.4*  CL 99 101  CO2 28 26  BUN 7 8  CREATININE 0.59 0.54  GLUCOSE 127* 151*  CALCIUM 8.6 8.2*   No results found for this basename: LABPT:2,INR:2 in the last 72 hours  Dorsiflexion/Plantar flexion intact  Assessment/Plan: DC in A.M. To SNF   Cully Luckow A 02/28/2012, 7:40 AM

## 2012-02-28 NOTE — Discharge Summary (Signed)
Physician Discharge Summary   Patient ID: Michelle [name] MRN: [id number] DOB/AGE: 08/07/1927 78 y.o.  Admit date: 02/26/2012 Discharge date: 02/29/2012  Primary Diagnosis:  Osteoarthritis, right knee  Admission Diagnoses:  Past Medical History  Diagnosis Date  . Hypertension   . Coronary artery disease   . Hypothyroidism   . Diabetes mellitus   . GERD (gastroesophageal reflux disease)   . Arthritis   . Myocardial infarction 2008,2010    with stents   Discharge Diagnoses:   Active Problems:  Osteoarthritis of right knee S/p right total knee arthroplasty  Procedure:  Procedure(s) (LRB): TOTAL KNEE ARTHROPLASTY (Right)   Consults: None  HPI: The patient is a 78 year old female who has been followed for her bilateral knee pain for over a year. The right knee is giving her a lot of trouble with pain and instability. She has had cortisone injections which were initial beneficial but are less effective recently. She also had viscosupplementation with Synvisc with limited benefit. X-rays show that she has severe joint space narrowing with complete collapse of the medial compartment of the right knee and osteophyte formation in the right knee. Due to failure of conservative measures, the most predicatable means for increased function and decreased pain in the right knee is a right total knee arthroplasty.  Laboratory Data: Hospital Outpatient Visit on 02/21/2012  Component Date Value Range Status  . MRSA, PCR 02/21/2012 NEGATIVE  NEGATIVE Final  . Staphylococcus aureus 02/21/2012 NEGATIVE  NEGATIVE Final   Comment:                                 The Xpert SA Assay (FDA                          approved for NASAL specimens                          in patients over 54 years of age),                          is one component of                          a comprehensive surveillance                          program.  Test performance has                          been validated by  [employer] for patients greater                          than or equal to 81 year old.                          It is not intended                          to diagnose infection nor to  guide or monitor treatment.  . WBC 02/21/2012 5.5  4.0 - 10.5 K/uL Final  . RBC 02/21/2012 4.40  3.87 - 5.11 MIL/uL Final  . Hemoglobin 02/21/2012 13.2  12.0 - 15.0 g/dL Final  . HCT 47/82/9562 40.1  36.0 - 46.0 % Final  . MCV 02/21/2012 91.1  78.0 - 100.0 fL Final  . MCH 02/21/2012 30.0  26.0 - 34.0 pg Final  . MCHC 02/21/2012 32.9  30.0 - 36.0 g/dL Final  . RDW 13/12/6576 13.4  11.5 - 15.5 % Final  . Platelets 02/21/2012 252  150 - 400 K/uL Final  . aPTT 02/21/2012 27  24 - 37 seconds Final  . Sodium 02/21/2012 141  135 - 145 mEq/L Final  . Potassium 02/21/2012 3.7  3.5 - 5.1 mEq/L Final  . Chloride 02/21/2012 103  96 - 112 mEq/L Final  . CO2 02/21/2012 29  19 - 32 mEq/L Final  . Glucose, Bld 02/21/2012 123* 70 - 99 mg/dL Final  . BUN 46/96/2952 17  6 - 23 mg/dL Final  . Creatinine, Ser 02/21/2012 0.67  0.50 - 1.10 mg/dL Final  . Calcium 84/13/2440 9.5  8.4 - 10.5 mg/dL Final  . Total Protein 02/21/2012 6.7  6.0 - 8.3 g/dL Final  . Albumin 03/23/2535 3.7  3.5 - 5.2 g/dL Final  . AST 64/40/3474 29  0 - 37 U/L Final  . ALT 02/21/2012 22  0 - 35 U/L Final  . Alkaline Phosphatase 02/21/2012 103  39 - 117 U/L Final  . Total Bilirubin 02/21/2012 0.6  0.3 - 1.2 mg/dL Final  . GFR calc non Af Amer 02/21/2012 80* >90 mL/min Final  . GFR calc Af Amer 02/21/2012 >90  >90 mL/min Final   Comment:                                 The eGFR has been calculated                          using the CKD EPI equation.                          This calculation has not been                          validated in all clinical                          situations.                          eGFR's persistently                          <90 mL/min signify                           possible Chronic Kidney Disease.  Marland Kitchen Prothrombin Time 02/21/2012 12.5  11.6 - 15.2 seconds Final  . INR 02/21/2012 0.94  0.00 - 1.49 Final  . ABO/RH(D) 02/21/2012 A POS   Final  . Antibody Screen 02/21/2012 NEG   Final  . Sample Expiration 02/21/2012 02/29/2012   Final  . Color, Urine 02/21/2012 YELLOW  YELLOW Final  . APPearance 02/21/2012 CLOUDY* CLEAR  Final  . Specific Gravity, Urine 02/21/2012 1.029  1.005 - 1.030 Final  . pH 02/21/2012 6.5  5.0 - 8.0 Final  . Glucose, UA 02/21/2012 NEGATIVE  NEGATIVE mg/dL Final  . Hgb urine dipstick 02/21/2012 NEGATIVE  NEGATIVE Final  . Bilirubin Urine 02/21/2012 NEGATIVE  NEGATIVE Final  . Ketones, ur 02/21/2012 NEGATIVE  NEGATIVE mg/dL Final  . Protein, ur 16/02/9603 NEGATIVE  NEGATIVE mg/dL Final  . Urobilinogen, UA 02/21/2012 1.0  0.0 - 1.0 mg/dL Final  . Nitrite 54/01/8118 NEGATIVE  NEGATIVE Final  . Leukocytes, UA 02/21/2012 MODERATE* NEGATIVE Final  . ABO/RH(D) 02/21/2012 A POS   Final  . Squamous Epithelial / LPF 02/21/2012 FEW* RARE Final  . WBC, UA 02/21/2012 7-10  <3 WBC/hpf Final  . Bacteria, UA 02/21/2012 FEW* RARE Final    Basename 02/28/12 0414 02/27/12 0504  HGB 8.6* 9.4*    Basename 02/28/12 0414 02/27/12 0504  WBC 7.1 5.4  RBC 2.81* 3.12*  HCT 25.3* 28.1*  PLT 147* 173    Basename 02/28/12 0414 02/27/12 0504  NA 136 135  K 3.0* 3.4*  CL 99 101  CO2 28 26  BUN 7 8  CREATININE 0.59 0.54  GLUCOSE 127* 151*  CALCIUM 8.6 8.2*    X-Rays:Dg Chest 2 View  02/21/2012  *RADIOLOGY REPORT*  Clinical Data: preop right total knee surgery.  CHEST - 2 VIEW  Comparison: 12/25/2006  Findings: Heart and mediastinal contours are within normal limits. No focal opacities or effusions.  No acute bony abnormality.  IMPRESSION: No active cardiopulmonary disease.   Original Report Authenticated By: Cyndie Chime, M.D.    Dg Bone Density  01/29/2012  *RADIOLOGY REPORT*  Clinical Data: 78 year old postmenopausal female with history  of low bone mass and diabetes.  The patient took Arimidex for 5 years. She takes calcium and vitamin D.  DUAL X-RAY ABSORPTIOMETRY (DXA) FOR BONE MINERAL DENSITY  LEFT FEMUR (NECK)  Bone Mineral Density (BMD):             0.574 g/cm2 Young Adult T Score:                           -0.5 Z Score:                                                 -0.1  RIGHT FOREARM (1/3 RADIUS)  Bone Mineral Density (BMD):                     0.484 g/cm2 Young Adult T Score:                                  -3.5 Z Score:                            -0.1  ASSESSMENT:  Patient's diagnostic category is OSTEOPOROSIS by WHO Criteria.  FRACTURE RISK: UNKNOWN  FRAX: World Health Organization FRAX assessment of absolute fracture risk is not calculated for this patient because the patient has osteoporosis.  Spine is not included in the evaluation due to degenerative change and scoliosis.  Comparison: There has been a statistically significant 13.1% decrease in BMD in the total left hip as  compared to 08/20/1968.  RECOMMENDATIONS:  Effective therapies are available in the form of bisphosphonates, selective estrogen receptor modulators, biologic agents, and hormone replacement therapy (for women).  All patients should ensure an adequate intake of dietary calcium (1200mg  daily) and vitamin D (800 IU daily) unless contraindicated.  All treatment decisions require clinical judgement and consideration of individual patient factors, including patient preferences, co-morbidities, previous drug use, risk factors not captured in the FRAX model (e.g., frailty, falls, vitamin D deficiency, increased bone turnover, interval significant decline in bone density) and possible under-or over-estimation of fracture risk by FRAX.  The National Osteoporosis Foundation recommends that FDA-approved medical therapies be considered in postmenopausal women and mean age 40 or older with a:        1)     Hip or vertebral (clinical or morphometric) fracture.           2)     T-score of -2.5 or lower at the spine or hip. 3)    Ten-year fracture probability by FRAX of 3% or greater for hip fracture or 20% or greater for major osteoporotic fracture. FOLLOW-UP:  People with diagnosed cases of osteoporosis or at high risk for fracture should have regular bone mineral density tests.  For patients eligible for Medicare, routine testing is allowed once every 2 years.  The testing frequency can be increased to one year for patients who have rapidly progressing disease, those who are receiving or discontinuing medical therapy to restore bone mass, or have additional risk factors.  World Science writer [facility]) Criteria:  Normal: T scores from +1.0 to -1.0 Low Bone Mass (Osteopenia): T scores between -1.0 and -2.5 Osteoporosis: T scores -2.5 and below  Comparison to Reference Population:  T score is the key measure used in the diagnosis of osteoporosis and relative risk determination for fracture.  It provides a value for bone mass relative to the mean bone mass of a young adult reference population expressed in terms of standard deviation (SD).  Z score is the age-matched score showing the patient's values compared to a population matched for age, sex, and race.  This is also expressed in terms of standard deviation.  The patient may have values that compare favorably to the age-matched values and still be at increased risk for fracture.   Original Report Authenticated By: Daryl Eastern, M.D.    Dg Knee Right Port  02/26/2012  *RADIOLOGY REPORT*  Clinical Data: Total knee replacement  PORTABLE RIGHT KNEE - 1-2 VIEW  Comparison: None.  Findings: Two portable films show a total knee arthroplasty. Components appear grossly well positioned.  There is a suprapatellar drain.  No radiographically detectable complication.  IMPRESSION: Total knee arthroplasty with good and expected appearance.   Original Report Authenticated By: Thomasenia Sales, M.D.    Hospital Course:  Michelle [name] is a 78  y.o. who was admitted to [facility]. They were brought to the operating room on 02/26/2012 and underwent Procedure(s): TOTAL KNEE ARTHROPLASTY.  Patient tolerated the procedure well and was later transferred to the recovery room and then to the orthopaedic floor for postoperative care.  They were given PO and IV analgesics for pain control following their surgery.  They were given 24 hours of postoperative antibiotics of   and started on DVT prophylaxis in the form of Xarelto.   PT and OT were ordered for total joint protocol.  Discharge planning consulted to help with postop disposition and equipment needs.  Patient had a rough  night on the evening of surgery due to pain and dizziness but started to get up OOB with therapy on day one.  Hemovac drain was pulled without difficulty.  Continued to work with therapy into day two and increased ambulation distance.  Dressing was changed on day two and the incision was clean and dry. On day three, the patient was progressing with therapy.  Incision was healing well.  Patient was seen in rounds and was ready to go to [facility].  Discharge Medications: Prior to Admission medications   Medication Sig Start Date End Date Taking? Authorizing Provider  atorvastatin (LIPITOR) 40 MG tablet Take 40 mg by mouth daily with breakfast.   Yes Historical Provider, MD  hydrochlorothiazide (HYDRODIURIL) 25 MG tablet Take 25 mg by mouth daily with breakfast.   Yes Historical Provider, MD  levothyroxine (SYNTHROID, LEVOTHROID) 50 MCG tablet Take 50 mcg by mouth daily with breakfast.   Yes Historical Provider, MD  metFORMIN (GLUMETZA) 500 MG (MOD) 24 hr tablet Take 500 mg by mouth daily with breakfast.   Yes Historical Provider, MD  metoprolol tartrate (LOPRESSOR) 25 MG tablet Take 25 mg by mouth daily after breakfast. Take 1 week prior to surgery and take 1 week after surgery   Yes Historical Provider, MD  alendronate (FOSAMAX) 70 MG tablet Take 70 mg by mouth  every 7 (seven) days. Take with a full glass of water on an empty stomach.--every Thursday    Historical Provider, MD  bisacodyl (DULCOLAX) 10 MG suppository Place 1 suppository (10 mg total) rectally daily as needed. 02/28/12   Lavar Rosenzweig Tamala Ser, PA  ferrous sulfate 325 (65 FE) MG tablet Take 1 tablet (325 mg total) by mouth 3 (three) times daily after meals. 02/28/12   Brenn Deziel Tamala Ser, PA  methocarbamol (ROBAXIN) 500 MG tablet Take 1 tablet (500 mg total) by mouth every 6 (six) hours as needed. 02/28/12   Fatimah Sundquist Tamala Ser, PA  oxyCODONE-acetaminophen (PERCOCET/ROXICET) 5-325 MG per tablet Take 1-2 tablets by mouth every 4 (four) hours as needed (Q4-6 hours PRN). 02/28/12   Deysi Soldo Tamala Ser, PA  polyethylene glycol (MIRALAX / GLYCOLAX) packet Take 17 g by mouth daily as needed. 02/28/12   Kayd Launer Tamala Ser, PA  rivaroxaban (XARELTO) 10 MG TABS tablet Take 1 tablet (10 mg total) by mouth daily with breakfast. 02/28/12   Yandell Mcjunkins Tamala Ser, PA    Diet: Cardiac diet Activity:WBAT Follow-up:in 2 weeks Disposition - Skilled nursing facility Discharged Condition: fair   Discharge Orders    Future Orders Please Complete By Expires   Diet Carb Modified      Call MD / Call 911      Comments:   If you experience chest pain or shortness of breath, CALL 911 and be transported to the hospital emergency room.  If you develope a fever above 101 F, pus (white drainage) or increased drainage or redness at the wound, or calf pain, call your surgeon's office.   Constipation Prevention      Comments:   Drink plenty of fluids.  Prune juice may be helpful.  You may use a stool softener, such as Colace (over the counter) 100 mg twice a day.  Use MiraLax (over the counter) for constipation as needed.   Increase activity slowly as tolerated      Discharge instructions      Comments:   Walk with your walker. Weight bearing as instructed. Change your dressing daily. Shower only, no  tub bath. Call if  any temperatures greater than 101 or any wound complications: [phone] during the day and ask for Dr. Jeannetta Ellis nurse, [facility] [place].   Driving restrictions      Comments:   No driving   Follow the hip precautions as taught in Physical Therapy          Medication List     As of 02/28/2012 11:28 AM    STOP taking these medications         aspirin EC 81 MG tablet      TAKE these medications         alendronate 70 MG tablet   Commonly known as: FOSAMAX   Take 70 mg by mouth every 7 (seven) days. Take with a full glass of water on an empty stomach.--every Thursday      atorvastatin 40 MG tablet   Commonly known as: LIPITOR   Take 40 mg by mouth daily with breakfast.      bisacodyl 10 MG suppository   Commonly known as: DULCOLAX   Place 1 suppository (10 mg total) rectally daily as needed.      ferrous sulfate 325 (65 FE) MG tablet   Take 1 tablet (325 mg total) by mouth 3 (three) times daily after meals.      hydrochlorothiazide 25 MG tablet   Commonly known as: HYDRODIURIL   Take 25 mg by mouth daily with breakfast.      levothyroxine 50 MCG tablet   Commonly known as: SYNTHROID, LEVOTHROID   Take 50 mcg by mouth daily with breakfast.      metFORMIN 500 MG (MOD) 24 hr tablet   Commonly known as: GLUMETZA   Take 500 mg by mouth daily with breakfast.      methocarbamol 500 MG tablet   Commonly known as: ROBAXIN   Take 1 tablet (500 mg total) by mouth every 6 (six) hours as needed.      metoprolol tartrate 25 MG tablet   Commonly known as: LOPRESSOR   Take 25 mg by mouth daily after breakfast. Take 1 week prior to surgery and take 1 week after surgery      oxyCODONE-acetaminophen 5-325 MG per tablet   Commonly known as: PERCOCET/ROXICET   Take 1-2 tablets by mouth every 4 (four) hours as needed (Q4-6 hours PRN).      polyethylene glycol packet   Commonly known as: MIRALAX / GLYCOLAX   Take 17 g by mouth daily as needed.      rivaroxaban 10 MG  Tabs tablet   Commonly known as: XARELTO   Take 1 tablet (10 mg total) by mouth daily with breakfast.         Signed: Zakia Sainato LAUREN 02/28/2012, 11:28 AM

## 2012-02-28 NOTE — Progress Notes (Signed)
Physical Therapy Treatment Patient Details Name: Michelle [name] MRN: [id number] DOB: 1928-05-26 Today's Date: 02/28/2012 Time: 4098-1191 PT Time Calculation (min): 25 min  PT Assessment / Plan / Recommendation Comments on Treatment Session  Pt increased ambulation distance this afternoon.  Performed exercises in chair, however with heel slides, pt unable to get more than approx 20 deg of knee flex.  Unsure if this was due to pt had increased difficulty following commands for exercise or decreased ROM due to pain.     Follow Up Recommendations  Post acute inpatient No, Recommend SNF    Barriers to Discharge        Equipment Recommendations  3 in 1 bedside comode    Recommendations for Other Services    Frequency 7X/week   Plan Discharge plan remains appropriate    Precautions / Restrictions Precautions Precautions: Knee Required Braces or Orthoses: Knee Immobilizer - Right Knee Immobilizer - Right: Discontinue once straight leg raise with < 10 degree lag Restrictions Weight Bearing Restrictions: Yes RLE Weight Bearing: Weight bearing as tolerated Other Position/Activity Restrictions: WBAT   Pertinent Vitals/Pain 8/10 with knee flex, ice packs applied    Mobility  Bed Mobility Details for Bed Mobility Assistance: Pt in recliner when PT arrived.  Transfers Transfers: Sit to Stand;Stand to Sit Sit to Stand: 1: +2 Total assist;With upper extremity assist;From chair/3-in-1;With armrests Sit to Stand: Patient Percentage: 60% Stand to Sit: 1: +2 Total assist;With upper extremity assist;To bed Stand to Sit: Patient Percentage: 70% Details for Transfer Assistance: Continues to require assist to rise with cues for hand placement and LE management.  Ambulation/Gait Ambulation/Gait Assistance: 1: +2 Total assist Ambulation/Gait: Patient Percentage: 60% Ambulation Distance (Feet): 20 Feet Assistive device: Rolling walker Ambulation/Gait Assistance Details: Mod cues for  sequencing/technique with RW and to maintain upright posture.  Gait Pattern: Step-to pattern;Trunk flexed;Decreased stride length;Decreased stance time - right;Decreased step length - left;Narrow base of support Gait velocity: decreased    Exercises Total Joint Exercises Ankle Circles/Pumps: AROM;Both;20 reps Quad Sets: AROM;Right;10 reps Heel Slides: AAROM;Right;10 reps Straight Leg Raises: AAROM;Right;10 reps   PT Diagnosis:    PT Problem List:   PT Treatment Interventions:     PT Goals Acute Rehab PT Goals PT Goal Formulation: With patient Time For Goal Achievement: 03/05/12 Potential to Achieve Goals: Good Pt will go Sit to Stand: with supervision PT Goal: Sit to Stand - Progress: Progressing toward goal Pt will go Stand to Sit: with supervision PT Goal: Stand to Sit - Progress: Progressing toward goal Pt will Ambulate: with least restrictive assistive device;51 - 150 feet;with supervision PT Goal: Ambulate - Progress: Progressing toward goal Pt will Perform Home Exercise Program: with supervision, verbal cues required/provided PT Goal: Perform Home Exercise Program - Progress: Progressing toward goal  Visit Information  Last PT Received On: 02/28/12 Assistance Needed: +2    Subjective Data  Subjective: I have to get up again Patient Stated Goal: to get back home after rehab   Cognition  Overall Cognitive Status: Appears within functional limits for tasks assessed/performed Arousal/Alertness: Awake/alert Orientation Level: Appears intact for tasks assessed Behavior During Session: [facility] for tasks performed Cognition - Other Comments: Still seems somewhat confused and slow to process verbal commands.      Balance     End of Session PT - End of Session Equipment Utilized During Treatment: Right knee immobilizer Activity Tolerance: Patient limited by pain;Patient limited by fatigue Patient left: in chair;with call bell/phone within reach Nurse Communication: Mobility  status  GP     Page, Meribeth Mattes 02/28/2012, 3:54 PM

## 2012-02-28 NOTE — Progress Notes (Signed)
Physical Therapy Treatment Patient Details Name: Michelle [name] MRN: [id number] DOB: 07-20-1927 Today's Date: 02/28/2012 Time: 4098-1191 PT Time Calculation (min): 18 min  PT Assessment / Plan / Recommendation Comments on Treatment Session  Pt continues to have increased pain with all mobility.  Did increase amb distance somewhat with max motivation.      Follow Up Recommendations  Post acute inpatient rehab    Barriers to Discharge        Equipment Recommendations  3 in 1 bedside comode    Recommendations for Other Services OT consult  Frequency 7X/week   Plan Discharge plan remains appropriate    Precautions / Restrictions Precautions Precautions: Knee Required Braces or Orthoses: Knee Immobilizer - Right Knee Immobilizer - Right: Discontinue once straight leg raise with < 10 degree lag Restrictions Weight Bearing Restrictions: Yes RLE Weight Bearing: Weight bearing as tolerated Other Position/Activity Restrictions: WBAT   Pertinent Vitals/Pain 8/10 pain, ice packs applied    Mobility  Bed Mobility Bed Mobility: Not assessed (Pt was standing with CNA when PT arrived. ) Transfers Transfers: Sit to Stand;Stand to Sit Sit to Stand: 1: +2 Total assist;With upper extremity assist;From bed;From elevated surface Sit to Stand: Patient Percentage: 60% Stand to Sit: 1: +2 Total assist;With upper extremity assist;With armrests;To chair/3-in-1 Stand to Sit: Patient Percentage: 70% Details for Transfer Assistance: Assist to rise and steady with cues for hand placement, safety and LE management when sitting/standing.  Ambulation/Gait Ambulation/Gait Assistance: 1: +2 Total assist Ambulation/Gait: Patient Percentage: 60% Ambulation Distance (Feet): 15 Feet Assistive device: Rolling walker Ambulation/Gait Assistance Details: Assist to steady throughout with mod cues for sequencing/technique with RW and to maintain upright posture.  Gait Pattern: Step-to pattern;Trunk flexed;Decreased  stride length;Decreased stance time - right;Decreased step length - left;Narrow base of support Gait velocity: decreased    Exercises     PT Diagnosis:    PT Problem List:   PT Treatment Interventions:     PT Goals Acute Rehab PT Goals PT Goal Formulation: With patient Time For Goal Achievement: 03/05/12 Potential to Achieve Goals: Good Pt will go Sit to Stand: with supervision PT Goal: Sit to Stand - Progress: Progressing toward goal Pt will go Stand to Sit: with supervision PT Goal: Stand to Sit - Progress: Progressing toward goal Pt will Ambulate: with least restrictive assistive device;51 - 150 feet;with supervision PT Goal: Ambulate - Progress: Progressing toward goal  Visit Information  Last PT Received On: 02/28/12 Assistance Needed: +2    Subjective Data  Subjective: I can't make it to Michelle hallway Patient Stated Goal: to get back home after rehab   Cognition  Overall Cognitive Status: Appears within functional limits for tasks assessed/performed Arousal/Alertness: Awake/alert Orientation Level: Appears intact for tasks assessed Behavior During Session: Michelle [name] for tasks performed Cognition - Other Comments: Still seems somewhat confused and slow to process verbal commands.      Balance     End of Session PT - End of Session Equipment Utilized During Treatment: Right knee immobilizer Activity Tolerance: Patient limited by pain;Patient limited by fatigue Patient left: in chair;with call bell/phone within reach Nurse Communication: Mobility status   GP     Page, Michelle [name] 02/28/2012, 11:12 AM

## 2012-02-29 LAB — CBC
MCHC: 33.8 g/dL (ref 30.0–36.0)
RDW: 13.1 % (ref 11.5–15.5)

## 2012-02-29 NOTE — Progress Notes (Signed)
Pt to be d/c today to [facility].   Pt agreeable and CSW left a message for the son to call concerning meeting Pt here or facility.   Confirmed plans with facility.  Plan transfer via EMS with a pick up time between 11:45-12 noon.   Leron Croak, LCSWA [employer] [phone]

## 2012-02-29 NOTE — Plan of Care (Signed)
Problem: Phase III Progression Outcomes Goal: Anticoagulant follow-up in place Outcome: Progressing SCDs on.

## 2012-02-29 NOTE — Progress Notes (Signed)
    Subjective: 3 Days Post-Op Procedure(s) (LRB): TOTAL KNEE ARTHROPLASTY (Right) Patient reports pain as 5 on 0-10 scale and 7 on 0-10 scale.   Denies CP or SOB.  Voiding without difficulty. Positive flatus.  Objective: Vital signs in last 24 hours: Temp:  [98.8 F (37.1 C)-99.5 F (37.5 C)] 98.9 F (37.2 C) (10/05 0612) Pulse Rate:  [62-82] 71  (10/05 0612) Resp:  [19-20] 19  (10/05 0612) BP: (101-112)/(51-53) 103/52 mmHg (10/05 0612) SpO2:  [92 %-95 %] 95 % (10/05 0612) Weight:  [71.3 kg (157 lb 3 oz)] 71.3 kg (157 lb 3 oz) (10/04 0824)  Intake/Output from previous day: 10/04 0701 - 10/05 0700 In: 240 [P.O.:240] Out: 1100 [Urine:1100] Intake/Output this shift:    Labs:  Basename 02/29/12 0508 02/28/12 0414 02/27/12 0504  HGB 8.8* 8.6* 9.4*    Basename 02/29/12 0508 02/28/12 0414  WBC 7.2 7.1  RBC 2.90* 2.81*  HCT 26.0* 25.3*  PLT 152 147*    Basename 02/28/12 0414 02/27/12 0504  NA 136 135  K 3.0* 3.4*  CL 99 101  CO2 28 26  BUN 7 8  CREATININE 0.59 0.54  GLUCOSE 127* 151*  CALCIUM 8.6 8.2*   No results found for this basename: LABPT:2,INR:2 in the last 72 hours  Physical Exam: Neurologically intact Neurovascular intact Dorsiflexion/Plantar flexion intact Incision: dressing C/D/I Compartment soft Pain with palpation both medially and laterally and posteriorly Physical exam findings are per Dr. Shon Baton  Assessment/Plan: 3 Days Post-Op Procedure(s) (LRB): TOTAL KNEE ARTHROPLASTY (Right) hgb up from 8.6 to 8.8 this morning Discharge to SNF  [place] for Dr. Venita Lick [facility] [phone] 02/29/2012, 8:16 AM

## 2012-03-02 MED ORDER — CEFAZOLIN SODIUM-DEXTROSE 2-3 GM-% IV SOLR
INTRAVENOUS | Status: DC | PRN
  Administered 2012-02-26: 2 g via INTRAVENOUS

## 2012-03-02 NOTE — Addendum Note (Signed)
Addendum  created 03/02/12 1610 by Edison Pace, CRNA   Modules edited:Anesthesia Medication Administration

## 2012-03-02 NOTE — Addendum Note (Signed)
Addendum  created 03/02/12 0903 by Gleen Ripberger E Romain Erion, CRNA   Modules edited:Anesthesia Medication Administration    

## 2012-03-12 ENCOUNTER — Emergency Department (HOSPITAL_COMMUNITY): Payer: Medicare Other

## 2012-03-12 ENCOUNTER — Emergency Department (HOSPITAL_COMMUNITY)
Admission: EM | Admit: 2012-03-12 | Discharge: 2012-03-12 | Disposition: A | Discharge status: Other Acute Inpatient Hospital | Payer: Medicare Other | Attending: Emergency Medicine | Admitting: Emergency Medicine

## 2012-03-12 ENCOUNTER — Encounter (HOSPITAL_COMMUNITY): Payer: Self-pay | Admitting: Emergency Medicine

## 2012-03-12 DIAGNOSIS — I10 Essential (primary) hypertension: Secondary | ICD-10-CM | POA: Insufficient documentation

## 2012-03-12 DIAGNOSIS — E119 Type 2 diabetes mellitus without complications: Secondary | ICD-10-CM | POA: Insufficient documentation

## 2012-03-12 DIAGNOSIS — K5900 Constipation, unspecified: Secondary | ICD-10-CM | POA: Insufficient documentation

## 2012-03-12 DIAGNOSIS — I2510 Atherosclerotic heart disease of native coronary artery without angina pectoris: Secondary | ICD-10-CM | POA: Insufficient documentation

## 2012-03-12 DIAGNOSIS — R112 Nausea with vomiting, unspecified: Secondary | ICD-10-CM | POA: Insufficient documentation

## 2012-03-12 DIAGNOSIS — Z79899 Other long term (current) drug therapy: Secondary | ICD-10-CM | POA: Insufficient documentation

## 2012-03-12 LAB — BASIC METABOLIC PANEL
GFR calc Af Amer: 89 mL/min — ABNORMAL LOW (ref >90)
GFR calc non Af Amer: 77 mL/min — ABNORMAL LOW (ref >90)
Potassium: 3.8 mEq/L (ref 3.5–5.1)
Sodium: 135 mEq/L (ref 135–145)

## 2012-03-12 LAB — URINALYSIS, ROUTINE W REFLEX MICROSCOPIC
Protein, ur: NEGATIVE mg/dL
Urobilinogen, UA: 1 mg/dL (ref 0.0–1.0)

## 2012-03-12 LAB — CBC WITH DIFFERENTIAL/PLATELET
Basophils Absolute: 0 10*3/uL (ref 0.0–0.1)
Basophils Relative: 0 % (ref 0–1)
Eosinophils Absolute: 0 10*3/uL (ref 0.0–0.7)
Lymphs Abs: 1.1 10*3/uL (ref 0.7–4.0)
MCH: 29.6 pg (ref 26.0–34.0)
MCHC: 33.3 g/dL (ref 30.0–36.0)
Neutrophils Relative %: 89 % — ABNORMAL HIGH (ref 43–77)
Platelets: 598 10*3/uL — ABNORMAL HIGH (ref 150–400)
RBC: 3.78 MIL/uL — ABNORMAL LOW (ref 3.87–5.11)

## 2012-03-12 LAB — URINE MICROSCOPIC-ADD ON

## 2012-03-12 MED ORDER — ONDANSETRON 8 MG PO TBDP: ORAL_TABLET | ORAL | Status: DC

## 2012-03-12 MED ORDER — PEG 3350-KCL-NABCB-NACL-NASULF 236 G PO SOLR: 4.0000 L | Freq: Once | ORAL | Status: DC

## 2012-03-12 MED ORDER — IOHEXOL 300 MG/ML  SOLN
100.0000 mL | Freq: Once | INTRAMUSCULAR | Status: AC | PRN
  Administered 2012-03-12: 100 mL via INTRAVENOUS

## 2012-03-12 MED ORDER — METOCLOPRAMIDE HCL 10 MG PO TABS: 10.0000 mg | ORAL_TABLET | Freq: Four times a day (QID) | ORAL | Status: DC | PRN

## 2012-03-12 MED ORDER — SODIUM CHLORIDE 0.9 % IV BOLUS (SEPSIS)
1000.0000 mL | Freq: Once | INTRAVENOUS | Status: AC
  Administered 2012-03-12: 1000 mL via INTRAVENOUS

## 2012-03-12 NOTE — ED Notes (Signed)
Received pt in rm 22.Per pr and son pt had knee replacement on 02/26/12 and has been having issues with constipation since then. Pt is currently residing in [facility], and within last few days has developed nausea and vomiting. Per pt's son staff at [facility] told him she is vomiting what looks like fecal matter. Pt sts she is not able to keep anything down and sts emesis is large and black in color. Pt sts no abdominal pain. Pt's son sts X-ray was done in [place] and is showing possible bowel obstruction so pt's Dr has instructed them to come to ED for eval.

## 2012-03-12 NOTE — ED Provider Notes (Signed)
2 weeks post-op nausea occasional emesis, only small hard BMs, no fever or constant abd pain, no evidence obstruction on CT, family and Pt OK with discharge back to rehab facility.  Patient / Family / Caregiver informed of clinical course, understand medical decision-making process, and agree with plan.  Hurman Horn, MD 03/14/12 1740

## 2012-03-12 NOTE — ED Provider Notes (Addendum)
History     CSN: [id number]  Arrival date & time 03/12/12  1138   First MD Initiated Contact with Patient 03/12/12 1154      Chief Complaint  Patient presents with  . Abdominal Pain    (Consider location/radiation/quality/duration/timing/severity/associated sxs/prior treatment) HPI Comments: Pt comes in with cc of some emesis. Pt reports that for the past 2 weeks she has constipation, with abnormal stools, and now she feels bloated and is having emesis immediately after she ingests something. Hx of abd surgery, but no abd pain at this time. No uti like sx.  The history is provided by the patient.    Past Medical History  Diagnosis Date  . Hypertension   . Coronary artery disease   . Hypothyroidism   . Diabetes mellitus   . GERD (gastroesophageal reflux disease)   . Arthritis   . Myocardial infarction 2008,2010    with stents    Past Surgical History  Procedure Date  . Coronary angioplasty 2008, 2010  . Abdominal hysterectomy 1969  . Knee arthroscopy 1980 ,1994    bilateral  . Total gastrectomy 1983    with vagotomy  . Hernia repair [id number]  . Breast surgery 2004    right -cancerous-with radiation  . Breast biopsy 2005    right -benign  . Foot ampuation 12/2009    right  . Hammer toe surgery 06/2009    right  . Total knee arthroplasty 02/26/2012    Procedure: TOTAL KNEE ARTHROPLASTY;  Surgeon: Jacki Cones, MD;  Location: [facility];  Service: Orthopedics;  Laterality: Right;    No family history on file.  History  Substance Use Topics  . Smoking status: Never Smoker   . Smokeless tobacco: Never Used  . Alcohol Use: No    OB History    Grav Para Term Preterm Abortions TAB SAB Ect Mult Living                  Review of Systems  Constitutional: Negative for activity change.  Eyes: Negative for visual disturbance.  Cardiovascular: Negative for chest pain.  Gastrointestinal: Positive for nausea and vomiting.    Allergies  Review of patient's  allergies indicates no known allergies.  Home Medications   Current Outpatient Rx  Name Route Sig Dispense Refill  . ATORVASTATIN CALCIUM 40 MG PO TABS Oral Take 40 mg by mouth at bedtime.     Marland Kitchen BISACODYL 10 MG RE SUPP Rectal Place 1 suppository (10 mg total) rectally daily as needed. 6 suppository 0  . FERROUS SULFATE 325 (65 FE) MG PO TABS Oral Take 1 tablet (325 mg total) by mouth 3 (three) times daily after meals. 30 tablet 0  . HYDROCHLOROTHIAZIDE 25 MG PO TABS Oral Take 25 mg by mouth daily with breakfast.    . LEVOTHYROXINE SODIUM 50 MCG PO TABS Oral Take 50 mcg by mouth daily with breakfast.    . METFORMIN HCL ER (MOD) 500 MG PO TB24 Oral Take 500 mg by mouth daily with breakfast.    . METHOCARBAMOL 500 MG PO TABS Oral Take 1 tablet (500 mg total) by mouth every 6 (six) hours as needed. 40 tablet 1  . METOPROLOL TARTRATE 25 MG PO TABS Oral Take 12.5 mg by mouth 2 (two) times daily. Take 1 week prior to surgery and take 1 week after surgery    . OXYCODONE-ACETAMINOPHEN 5-325 MG PO TABS Oral Take 1-2 tablets by mouth every 4 (four) hours as needed (Q4-6 hours PRN). 60  tablet 0  . POLYETHYLENE GLYCOL 3350 PO PACK Oral Take 17 g by mouth daily as needed. 14 each 0  . PROMETHAZINE HCL 25 MG PO TABS Oral Take 25 mg by mouth 2 (two) times daily.    Marland Kitchen RIVAROXABAN 10 MG PO TABS Oral Take 10 mg by mouth daily with breakfast.      BP 128/93  Pulse 87  Temp 97.4 F (36.3 C) (Oral)  SpO2 98%  Physical Exam  Constitutional: She is oriented to person, place, and time. She appears well-developed and well-nourished.  HENT:  Head: Normocephalic and atraumatic.  Eyes: EOM are normal. Pupils are equal, round, and reactive to light.  Neck: Neck supple.  Cardiovascular: Normal rate, regular rhythm and normal heart sounds.   No murmur heard. Pulmonary/Chest: Effort normal. No respiratory distress.  Abdominal: Soft. She exhibits no distension. There is no tenderness. There is no rebound and no  guarding.  Neurological: She is alert and oriented to person, place, and time.  Skin: Skin is warm and dry.    ED Course  Procedures (including critical care time)  Labs Reviewed  CBC WITH DIFFERENTIAL - Abnormal; Notable for the following:    WBC 12.3 (*)     RBC 3.78 (*)     Hemoglobin 11.2 (*)     HCT 33.6 (*)     Platelets 598 (*)     Neutrophils Relative 89 (*)     Neutro Abs 10.9 (*)     Lymphocytes Relative 9 (*)     All other components within normal limits  BASIC METABOLIC PANEL - Abnormal; Notable for the following:    Chloride 92 (*)     Glucose, Bld 105 (*)     BUN 27 (*)     GFR calc non Af Amer 77 (*)     GFR calc Af Amer 89 (*)     All other components within normal limits  PHOSPHORUS  URINALYSIS, ROUTINE W REFLEX MICROSCOPIC   Dg Abd Acute W/chest  03/12/2012  *RADIOLOGY REPORT*  Clinical Data: Severe constipation.  Patient vomiting stool.  ACUTE ABDOMEN SERIES (ABDOMEN 2 VIEW & CHEST 1 VIEW)  Comparison: CT abdomen and pelvis 07/18/2011 and plain film chest 12/25/2006.  Findings: Single view of the chest demonstrates unchanged mild elevation of the right hemidiaphragm.  Lungs are clear.  Heart size is normal.  No pneumothorax or pleural fluid.  Two views of the abdomen show no free intraperitoneal air.  There is no evidence of bowel obstruction.  Stool burden appears moderate.  Postoperative change upper abdomen noted.  Convex left scoliosis is seen.  IMPRESSION: No acute finding chest or abdomen.   Original Report Authenticated By: Bernadene Bell. D'ALESSIO, M.D.      1. Constipation       MDM  DDx includes: Pancreatitis Hepatobiliary pathology including cholecystitis Gastritis/PUD SBO ACS syndrome Aortic Dissection Colitis AAA Tumors Colitis Intra abdominal abscess Thrombosis Mesenteric ischemia Diverticulitis Peritonitis Appendicitis Hernia Nephrolithiasis Pyelonephritis UTI/Cystitis  Pt comes in with cc of some emesis, Pt reports that for  the past 2 weeks she has constipation, with abnormal stools, and now she feels bloated and is having emesis immediately after she ingests something. Hx of abd surgery. No abd tenderness on exam. AAS is negative. Has hx of cancer in the family that has led to constipation.  Plan is to get CT - if negative, she will be d/c to nursing home. Pt informed that she is slightly dehydrated, and that if  she continues to have emesis, and unable to tolerate po, she can return to the ER.      Derwood Kaplan, MD 03/12/12 1727  Derwood Kaplan, MD 03/28/12 1610

## 2012-03-12 NOTE — ED Notes (Signed)
Bed:WA22<BR> Expected date:<BR> Expected time:<BR> Means of arrival:<BR> Comments:<BR> Triage 1

## 2012-03-12 NOTE — ED Notes (Signed)
Per pts son he states mother had knee replacement surgery on 02/26/2012 and following the surgery began having some complications. Pts son states that she was not eating, and has N/V. Pt states emesis is black in color when she vomits. Pt went to see orthopedic dr today and he said it sounded like she had some bowel obstruction and he instructed her to come here to get checked out.

## 2012-03-23 ENCOUNTER — Encounter: Payer: Self-pay | Admitting: Interventional Cardiology

## 2012-04-01 ENCOUNTER — Other Ambulatory Visit: Payer: Self-pay | Admitting: Gastroenterology

## 2012-04-01 DIAGNOSIS — R1110 Vomiting, unspecified: Secondary | ICD-10-CM

## 2012-04-01 DIAGNOSIS — R1013 Epigastric pain: Secondary | ICD-10-CM

## 2012-04-01 DIAGNOSIS — R634 Abnormal weight loss: Secondary | ICD-10-CM

## 2012-04-10 ENCOUNTER — Other Ambulatory Visit: Payer: Medicare Other

## 2012-04-10 ENCOUNTER — Other Ambulatory Visit: Payer: Self-pay | Admitting: Gastroenterology

## 2012-04-10 ENCOUNTER — Ambulatory Visit
Admission: RE | Admit: 2012-04-10 | Discharge: 2012-04-10 | Disposition: A | Discharge status: Home / Self Care | Payer: Medicare Other | Source: Ambulatory Visit | Attending: Gastroenterology | Admitting: Gastroenterology

## 2012-04-10 DIAGNOSIS — R634 Abnormal weight loss: Secondary | ICD-10-CM

## 2012-04-10 DIAGNOSIS — R1013 Epigastric pain: Secondary | ICD-10-CM

## 2012-04-10 DIAGNOSIS — R1110 Vomiting, unspecified: Secondary | ICD-10-CM

## 2012-12-10 ENCOUNTER — Ambulatory Visit: Payer: Medicare Other | Admitting: Neurology

## 2012-12-23 IMAGING — RF DG UGI W/ KUB
19 of 24 series · 19 of 24 positions shown · non-contrast
Comparison: CT abdomen pelvis of 03/12/2012

CLINICAL DATA: Vomiting, epigastric pain, weight loss

UPPER GI SERIES WITH KUB
TECHNIQUE: Routine upper GI series was performed with thin barium
Fluoroscopy Time: 3.2 minutes

[Series 3: run · 1 of 7 slices shown (1 of 19)]
[im 1/7]
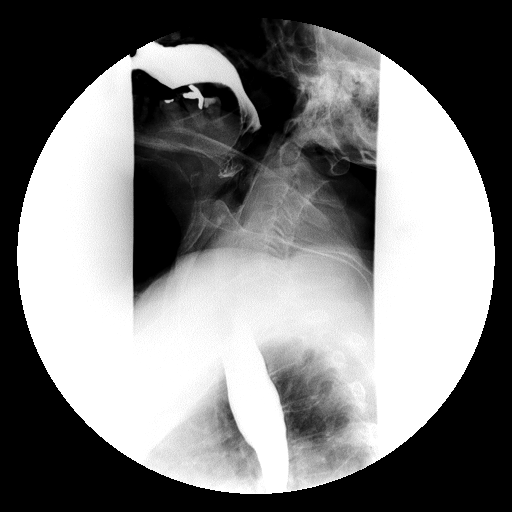

[Series 4: run · 1 of 1 slices shown (2 of 19)]
[im 1/1]
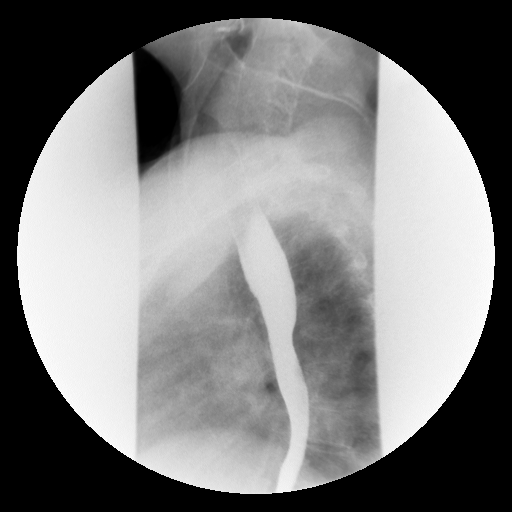

[Series 6: run · 1 of 1 slices shown (3 of 19)]
[im 1/1]
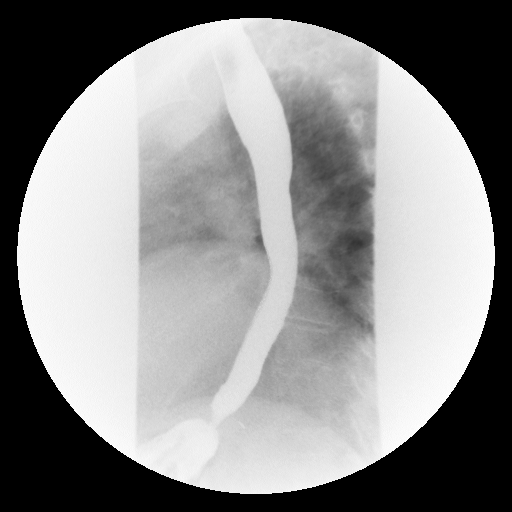

[Series 7: run · 1 of 1 slices shown (4 of 19)]
[im 1/1]
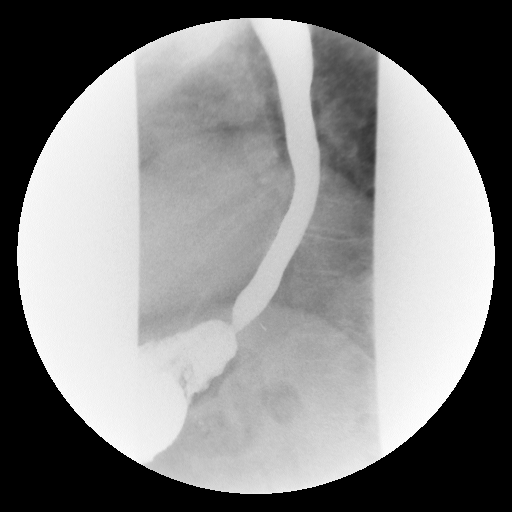

[Series 8: run · 1 of 1 slices shown (5 of 19)]
[im 1/1]
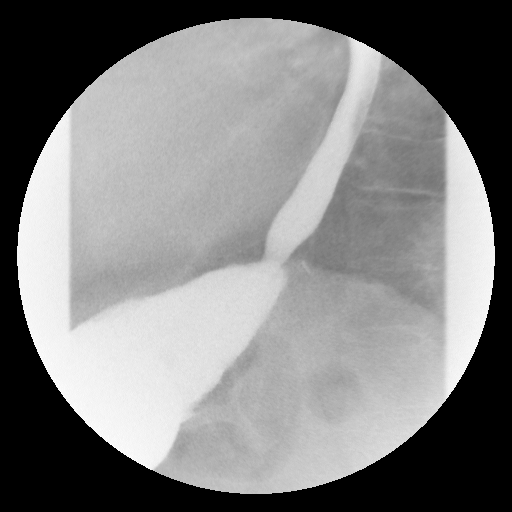

[Series 9: run · 1 of 1 slices shown (6 of 19)]
[im 1/1]
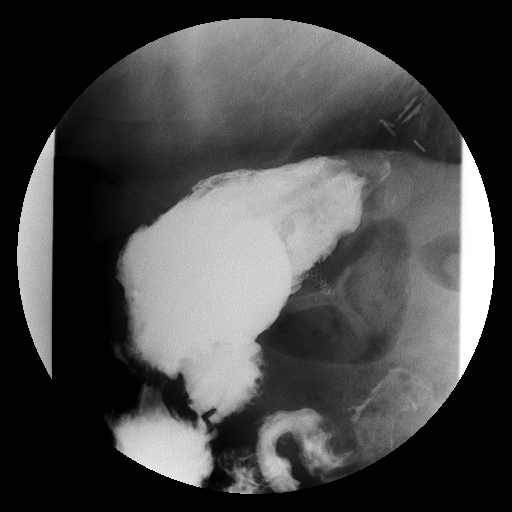

[Series 11: run · 1 of 1 slices shown (7 of 19)]
[im 1/1]
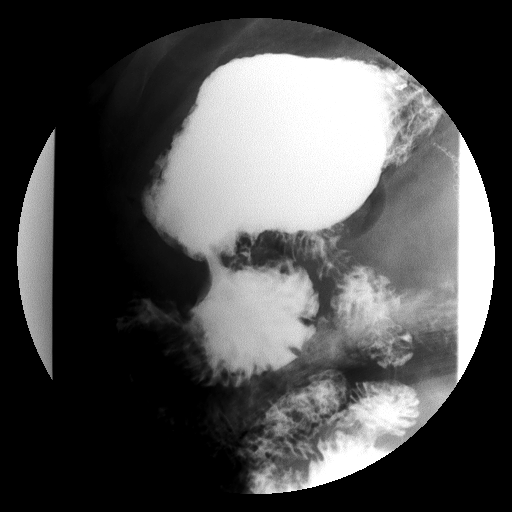

[Series 12: run · 1 of 1 slices shown (8 of 19)]
[im 1/1]
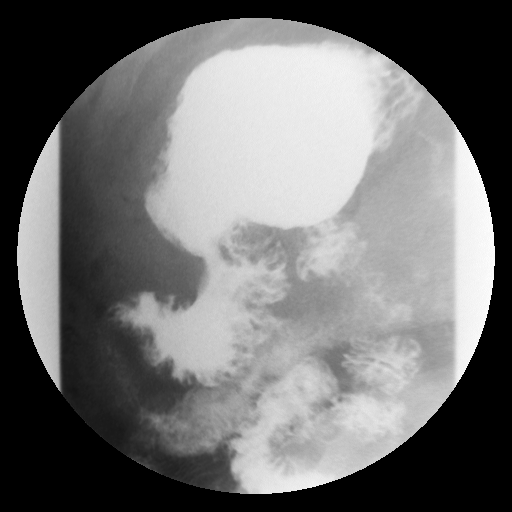

[Series 13: run · 1 of 1 slices shown (9 of 19)]
[im 1/1]
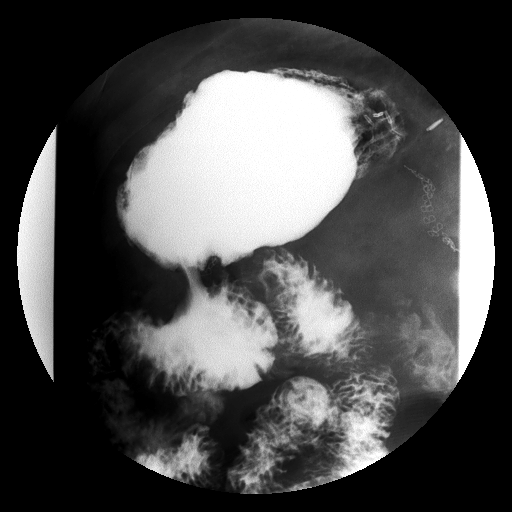

[Series 15: run · 1 of 1 slices shown (10 of 19)]
[im 1/1]
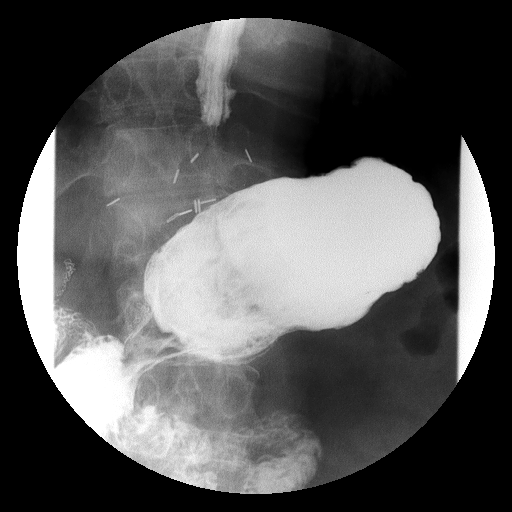

[Series 16: run · 1 of 1 slices shown (11 of 19)]
[im 1/1]
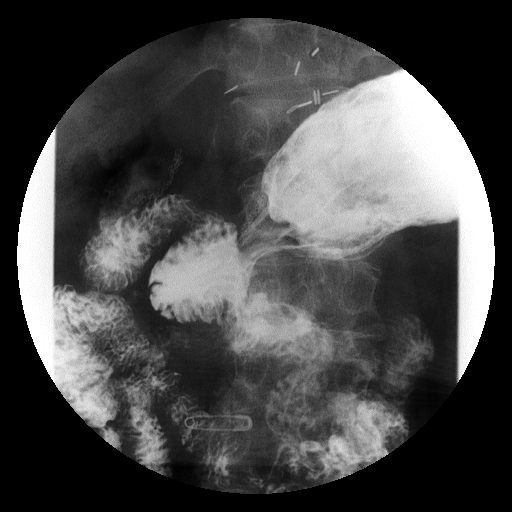

[Series 17: run · 1 of 1 slices shown (12 of 19)]
[im 1/1]
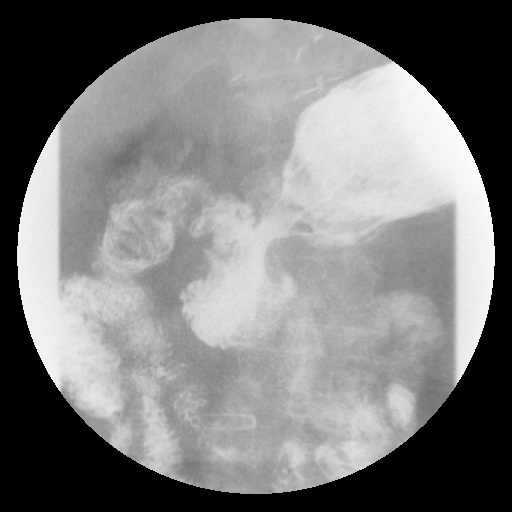

[Series 18: run · 1 of 1 slices shown (13 of 19)]
[im 1/1]
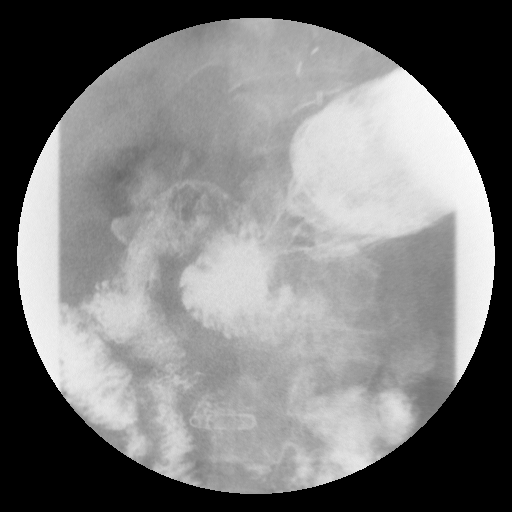

[Series 20: run · 1 of 1 slices shown (14 of 19)]
[im 1/1]
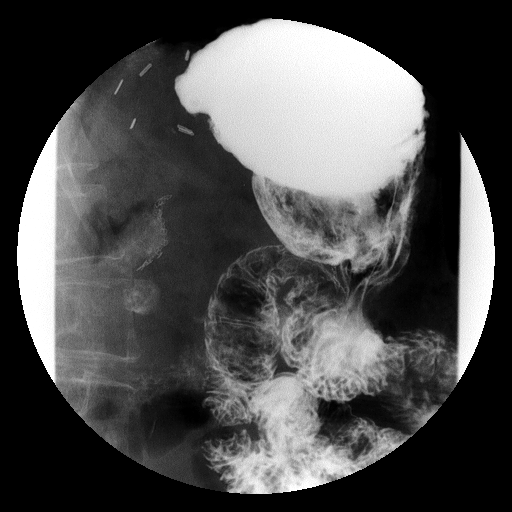

[Series 21: run · 1 of 1 slices shown (15 of 19)]
[im 1/1]
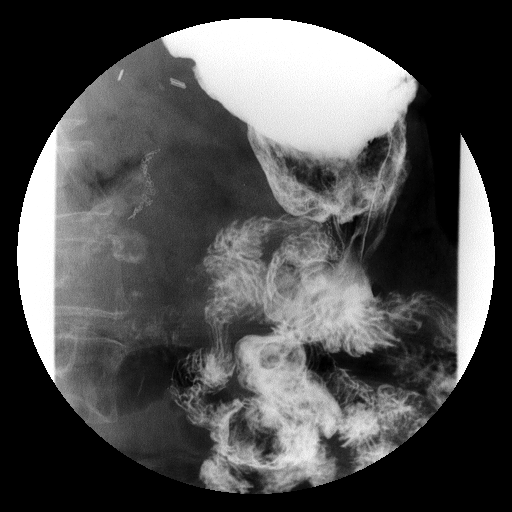

[Series 22: run · 1 of 1 slices shown (16 of 19)]
[im 1/1]
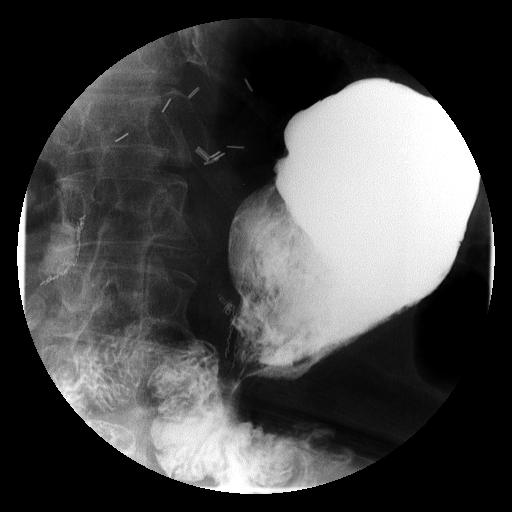

[Series 23: run · 1 of 1 slices shown (17 of 19)]
[im 1/1]
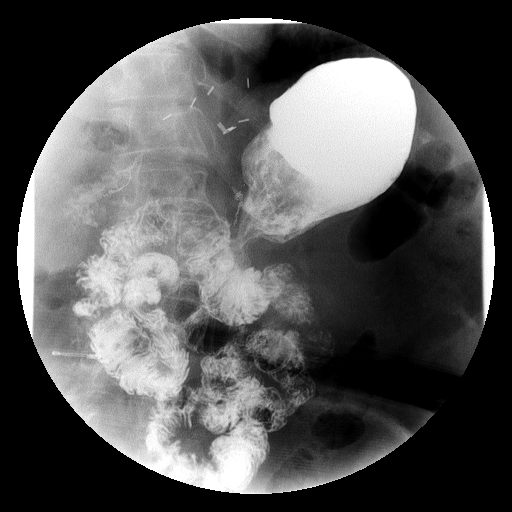

[Series 25: run · 1 of 7 slices shown (18 of 19)]
[im 1/7]
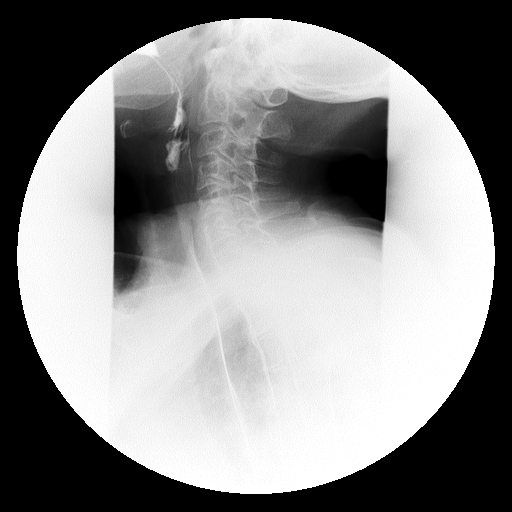

[Series 26: run · 1 of 1 slices shown (19 of 19)]
[im 1/1]
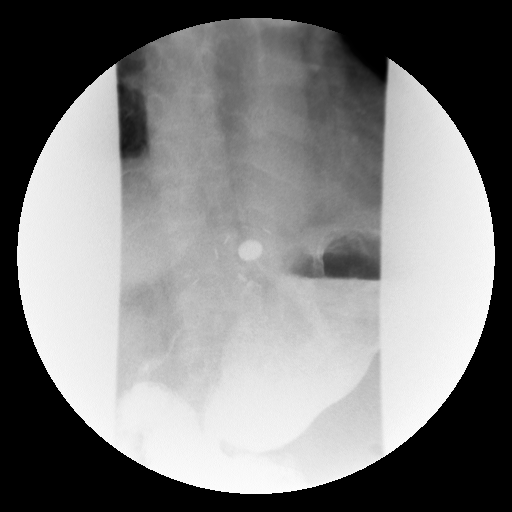

[19 of 24 positions shown; findings below may reference images not displayed]

FINDINGS: A preliminary film of the abdomen shows a nonspecific
bowel gas pattern.  Surgical sutures and clips are noted at the GE
junction and within the mid upper abdomen from prior
gastrojejunostomy and partial gastrectomy as well as vagotomy.

Rapid sequence spot films of the cervical esophagus show a normal
swallowing mechanism.  There are moderate tertiary contractions in
the mid and distal esophagus.  No reflux could be demonstrated.  At
the end of study a barium pill was given which did lodge in the
distal esophagus just above the gastroesophageal junction and did
not pass despite being given additional water and barium.  This
indicates a short segment distal esophageal stricture.

The remaining stomach is well visualized with no abnormality.
However, the anastomotic site between stomach and jejunum appears
consistently narrowed on multiple views in different projections.
No ulceration is seen at the anastomotic site.
IMPRESSION: 1.  Short segment distal esophageal stricture where the barium pill
lodges and does not pass.
2.  Narrowed gastrojejunostomy anastomotic site.  No ulceration is
seen.
3.  Moderate tertiary contractions in the mid and distal esophagus.

## 2013-02-24 DIAGNOSIS — M6530 Trigger finger, unspecified finger: Secondary | ICD-10-CM

## 2013-02-24 HISTORY — DX: Trigger finger, unspecified finger: M65.30

## 2013-03-18 ENCOUNTER — Encounter: Payer: Self-pay | Admitting: Interventional Cardiology

## 2013-04-29 ENCOUNTER — Other Ambulatory Visit: Payer: Self-pay | Admitting: Interventional Cardiology

## 2013-05-03 ENCOUNTER — Encounter: Payer: Self-pay | Admitting: Interventional Cardiology

## 2013-05-03 DIAGNOSIS — I2510 Atherosclerotic heart disease of native coronary artery without angina pectoris: Secondary | ICD-10-CM | POA: Insufficient documentation

## 2013-05-03 DIAGNOSIS — M1990 Unspecified osteoarthritis, unspecified site: Secondary | ICD-10-CM | POA: Insufficient documentation

## 2013-05-03 DIAGNOSIS — K219 Gastro-esophageal reflux disease without esophagitis: Secondary | ICD-10-CM | POA: Insufficient documentation

## 2013-05-03 DIAGNOSIS — E039 Hypothyroidism, unspecified: Secondary | ICD-10-CM | POA: Insufficient documentation

## 2013-05-03 DIAGNOSIS — I219 Acute myocardial infarction, unspecified: Secondary | ICD-10-CM | POA: Insufficient documentation

## 2013-05-03 DIAGNOSIS — I10 Essential (primary) hypertension: Secondary | ICD-10-CM | POA: Insufficient documentation

## 2013-05-04 ENCOUNTER — Encounter: Payer: Self-pay | Admitting: Interventional Cardiology

## 2013-05-04 ENCOUNTER — Telehealth: Payer: Self-pay | Admitting: Pharmacist

## 2013-05-04 ENCOUNTER — Ambulatory Visit (INDEPENDENT_AMBULATORY_CARE_PROVIDER_SITE_OTHER): Payer: Medicare Other | Admitting: Interventional Cardiology

## 2013-05-04 VITALS — BP 128/68 | HR 69 | Ht 60.0 in | Wt 144.0 lb

## 2013-05-04 DIAGNOSIS — E782 Mixed hyperlipidemia: Secondary | ICD-10-CM

## 2013-05-04 DIAGNOSIS — I219 Acute myocardial infarction, unspecified: Secondary | ICD-10-CM

## 2013-05-04 DIAGNOSIS — I2510 Atherosclerotic heart disease of native coronary artery without angina pectoris: Secondary | ICD-10-CM

## 2013-05-04 DIAGNOSIS — I10 Essential (primary) hypertension: Secondary | ICD-10-CM

## 2013-05-04 MED ORDER — NITROGLYCERIN 0.4 MG SL SUBL: 0.4000 mg | SUBLINGUAL_TABLET | SUBLINGUAL | Status: DC | PRN

## 2013-05-04 NOTE — Progress Notes (Signed)
Patient ID: Michelle [name], female   DOB: 1928-05-25, 79 y.o.   MRN: [id number]    [address] [place], [place]  [address] Phone: [phone] Fax:  [phone]  Date:  05/04/2013   ID:  Michelle [name], DOB June 27, 1927, MRN [id number]  PCP:  Ralene Ok, MD      History of Present Illness: Michelle [name] is a 79 y.o. female who has CAD/MI. SHe had a stent for the MI and required another stent 2 years later. She had her TKR done. She ended up having foot surgery and had complications from that in 2013. SHe had repeat procedures including a toe amputation in 2013. Negative stress test in 9/13.   She is not walking at all.  She has not had a good result from the right TKR.  She has lost weight since last year.  One episode of chest pain when she stood up.  It raideates t the back.  It was different from her prior heart pain.  She took a NTG which did help.  She very rarely uses NTG.   She's had a lot of recent stress.  Her sister passed away. She has had an other family illness. She has had issues with her pets.    Wt Readings from Last 3 Encounters:  05/04/13 144 lb (65.318 kg)  02/28/12 157 lb 3 oz (71.3 kg)  02/28/12 157 lb 3 oz (71.3 kg)     Past Medical History  Diagnosis Date  . Hypertension   . Coronary artery disease   . Hypothyroidism   . Diabetes mellitus   . GERD (gastroesophageal reflux disease)   . Arthritis   . Myocardial infarction 2008,2010    with stents    Current Outpatient Prescriptions  Medication Sig Dispense Refill  . atorvastatin (LIPITOR) 40 MG tablet 1 TABLET ORALLY ONCE A DAY  90 tablet  0  . bisacodyl (DULCOLAX) 10 MG suppository Place 1 suppository (10 mg total) rectally daily as needed.  6 suppository  0  . hydrochlorothiazide (HYDRODIURIL) 25 MG tablet Take 25 mg by mouth daily with breakfast.      . levothyroxine (SYNTHROID, LEVOTHROID) 50 MCG tablet Take 50 mcg by mouth daily with breakfast.      . metFORMIN (GLUMETZA) 500 MG (MOD) 24  hr tablet Take 500 mg by mouth daily with breakfast.       No current facility-administered medications for this visit.    Allergies:   No Known Allergies  Social History:  The patient  reports that she has never smoked. She has never used smokeless tobacco. She reports that she does not drink alcohol or use illicit drugs.   Family History:  The patient's Family history is unknown by patient.   ROS:  Please see the history of present illness.  No nausea, vomiting.  No fevers, chills.  No focal weakness.  No dysuria.   All other systems reviewed and negative.   PHYSICAL EXAM: VS:  BP 128/68  Pulse 69  Ht 5' (1.524 m)  Wt 144 lb (65.318 kg)  BMI 28.12 kg/m2 Well nourished, well developed, in no acute distress HEENT: normal Neck: no JVD, no carotid bruits Cardiac:  normal S1, S2; RRR;  Lungs:  clear to auscultation bilaterally, no wheezing, rhonchi or rales Abd: soft, nontender, no hepatomegaly Ext: no edema Skin: warm and dry Neuro:   no focal abnormalities noted  EKG:  NSR, NSST     ASSESSMENT AND PLAN:  Coronary atherosclerosis of native coronary artery  Refill Nitroglycerin Tablet Sublingual, 0.4 MG, 1 tablet under the tongue, Sublingual, as needed, 30 days, 25, Refills 3 If back pain becomes more frequent, would plan ischemia w/u. Negative stress in 2013, September. 2. Mixed hyperlipidemia  Start Atorvastatin Calcium Tablet, 40 MG, 1 tablet, Orally, Once a day, 30 day(s), 30, Refills 11 LAB: STAT[facility] paneltoday.  LDL 141 on most recent lab check with primary care Dr. Stann Mainland refer back to lipid clinic. LDL target is close to 70 given prior MI.   3. Essential hypertension, benign  Continue Hydrochlorothiazide Tablet, 25 MG, 1 tablet, Orally, Once a day. Controlled. Potassium 3.5. Normal creatinine in October 2014. Labs reviewed.   Signed, Fredric Mare, MD, [facility] 05/04/2013 1:41 PM

## 2013-05-04 NOTE — Patient Instructions (Signed)
Your physician wants you to follow-up in: 1 year with Dr. Eldridge Dace. You will receive a reminder letter in the mail two months in advance. If you don't receive a letter, please call our office to schedule the follow-up appointment.  Your physician has recommended you make the following change in your medication:   1. Start SL Nitroglycerin 0.4 mg as needed for CP.   2. Continue all other medications.

## 2013-05-04 NOTE — Telephone Encounter (Signed)
Dr. Eldridge Dace forwarded patient's lipid panel from PCP on 03/18/13 for review. LDL 141, TC 225, TG 87, HDL 59  RF:  CAD, insulin resistance, HTN, age - LDL goal < 70, non-HDL goal < 100. Meds:  Not on lipid lowering med currently (previously on lipitor 40 mg qd). Patient's LDL was previously 57, and TC 127, when she was on lipitor 40 mg qd in the past. Dr. Eldridge Dace restarted patient on lipitor 40 mg qd per his office note today - I agree with this plan. Plan: 1.  Continue lipitor 40 mg qd as prescribed today. 2.  Recheck lipid and liver panel in 3 months. Please notify patient and set up lab. Thanks.

## 2013-05-13 NOTE — Telephone Encounter (Signed)
Pt notified. Labs ordered.

## 2013-07-07 ENCOUNTER — Telehealth: Payer: Self-pay | Admitting: Interventional Cardiology

## 2013-07-07 NOTE — Telephone Encounter (Signed)
Received request from Nurse fax box, documents faxed for surgical clearance. To: Tomasita Crumble Fax number: [phone] Attention: [phone]/[id number]

## 2013-08-02 ENCOUNTER — Other Ambulatory Visit: Payer: Medicare Other

## 2013-11-24 ENCOUNTER — Encounter: Payer: Self-pay | Admitting: *Deleted

## 2014-01-04 ENCOUNTER — Telehealth: Payer: Self-pay | Admitting: Interventional Cardiology

## 2014-01-04 NOTE — Telephone Encounter (Signed)
New problem   Pt is having trouble breathing.

## 2014-01-04 NOTE — Telephone Encounter (Signed)
SPOKE  WITH  PT  FOR  VERY LONG  TIME  IS  COMPLAINING  OF  SOB  FOR  5 DAYS   NOTABLY  WORSE TODAY  ALMOST  CONSTANT  IS  WANTING  AN APPT THIS  WEEK   PT  AWARE  DR  VARANASI OUT OF  OFFICE  ASKED  PT IF  TRIED   NTG  PT HAS   TAKEN  3 TABS  WITH NO CHANGE  INFORMED PT  NEEDS TO GO  TO  ER  FOR EVAL  AND  TX  REFUSES   WILL FORWARD  TO [facility] AND  DR  Eldridge Dace FOR  REVIEW .Zack Seal

## 2014-01-05 NOTE — Telephone Encounter (Signed)
Agree that she should go to the ER.  If she refuses.  WOuld have her see an APP and have BMet, BNP checked.  Possibly troponin as well.  If elevated, this may convince her to go to the ER.

## 2014-01-06 NOTE — Telephone Encounter (Signed)
Spoke with pt and she agrees to call her son to get him to take her to the ER. She is she still having intermittent symptoms and she is concerned.

## 2014-01-07 NOTE — Telephone Encounter (Addendum)
Called Michelle [name] to check on her and she proceeds to tell me she didn't go to the ER yesterday. She decided to make an appt to see her PCP Dr. Channing Mutters today. She will call me back to let me know what the outcome is. Michelle [name] did say that she is feeling somewhat better, but she still voiced her concern because when she called the first day when she couldn't breathe someone told her it would be December before we could get her in. I assured Michelle [name] that we would have tried to work her in over the next few days, but she had told me yesterday that she agreed to go to the ER. Michelle [name] thanked me for calling her and checking on her. Michelle [name] will call me back with an update.

## 2014-01-10 NOTE — Telephone Encounter (Signed)
Spoke with pt and she did go see her PCP, but she is not sure what labs they drew or if an EKG was done. Pt is upset and wants to be seen by Dr. Eldridge Dace. Appt made for 01/12/14. Pt aware.

## 2014-01-12 ENCOUNTER — Encounter: Payer: Self-pay | Admitting: Interventional Cardiology

## 2014-01-12 ENCOUNTER — Ambulatory Visit (INDEPENDENT_AMBULATORY_CARE_PROVIDER_SITE_OTHER): Payer: Medicare Other | Admitting: Interventional Cardiology

## 2014-01-12 ENCOUNTER — Ambulatory Visit (HOSPITAL_COMMUNITY): Payer: Medicare Other | Attending: Cardiovascular Disease | Admitting: *Deleted

## 2014-01-12 VITALS — BP 102/58 | HR 68 | Ht 61.0 in | Wt 151.0 lb

## 2014-01-12 DIAGNOSIS — M7989 Other specified soft tissue disorders: Secondary | ICD-10-CM | POA: Diagnosis present

## 2014-01-12 DIAGNOSIS — I10 Essential (primary) hypertension: Secondary | ICD-10-CM

## 2014-01-12 DIAGNOSIS — E782 Mixed hyperlipidemia: Secondary | ICD-10-CM

## 2014-01-12 DIAGNOSIS — I209 Angina pectoris, unspecified: Secondary | ICD-10-CM

## 2014-01-12 DIAGNOSIS — R0602 Shortness of breath: Secondary | ICD-10-CM

## 2014-01-12 DIAGNOSIS — I2510 Atherosclerotic heart disease of native coronary artery without angina pectoris: Secondary | ICD-10-CM

## 2014-01-12 DIAGNOSIS — I25119 Atherosclerotic heart disease of native coronary artery with unspecified angina pectoris: Secondary | ICD-10-CM

## 2014-01-12 LAB — D-DIMER, QUANTITATIVE (NOT AT ARMC): D DIMER QUANT: 0.91 ug{FEU}/mL — AB (ref 0.00–0.48)

## 2014-01-12 NOTE — Progress Notes (Signed)
Venous duplex bilateral lower extremities complete.  

## 2014-01-12 NOTE — Progress Notes (Signed)
Patient ID: Michelle [name], female   DOB: 08/26/1927, 80 y.o.   MRN: [id number] Patient ID: Michelle [name], female   DOB: 05-Apr-1928, 80 y.o.   MRN: [id number]    [address] [place], [place]  [address] Phone: [phone] Fax:  [phone]  Date:  01/12/2014   ID:  Michelle [name], DOB [birth year], MRN [id number]  PCP:  Ralene Ok, MD      History of Present Illness: Michelle [name] is a 80 y.o. female who has CAD/MI. SHe had a stent for the MI and required another stent 2 years later. She had her TKR done. She ended up having foot surgery and had complications from that in 2013. SHe had repeat procedures including a toe amputation in 2013. Negative stress test in 9/13.   She is not walking at all.  She has not had a good result from the right TKR.  She has lost weight since last year.  One episode of chest pain when she stood up.  It raideates t the back.  It was different from her prior heart pain.  She took a NTG which did help.  She very rarely uses NTG.   She's had a lot of recent stress in 05-13-2023.  Her sister passed away. She has had an other family illness. She has had issues with her pets.  Over the past few weeks, she feels like she has been "running uo the stairs all of the time."  Wt Readings from Last 3 Encounters:  01/12/14 151 lb (68.493 kg)  05/04/13 144 lb (65.318 kg)  02/28/12 157 lb 3 oz (71.3 kg)     Past Medical History  Diagnosis Date  . Hypertension   . Coronary artery disease   . Hypothyroidism   . Diabetes mellitus   . GERD (gastroesophageal reflux disease)   . Arthritis   . Myocardial infarction 2008,2010    with stents  . Hyperlipidemia   . ASCVD (arteriosclerotic cardiovascular disease)     S/P NSTEMI with LCX stent 2008, cath on 06/29/2008 revealed widely patent circumflex  . Angina pectoris     intrascapular, abnormal cardiolote preop TKR    Current Outpatient Prescriptions  Medication Sig Dispense Refill  . atorvastatin (LIPITOR) 40 MG  tablet 1 TABLET ORALLY ONCE A DAY  90 tablet  0  . bisacodyl (DULCOLAX) 10 MG suppository Place 1 suppository (10 mg total) rectally daily as needed.  6 suppository  0  . hydrochlorothiazide (HYDRODIURIL) 25 MG tablet Take 25 mg by mouth daily with breakfast.      . levothyroxine (SYNTHROID, LEVOTHROID) 50 MCG tablet Take 50 mcg by mouth daily with breakfast.      . metFORMIN (GLUMETZA) 500 MG (MOD) 24 hr tablet Take 500 mg by mouth daily with breakfast.      . nitroGLYCERIN (NITROSTAT) 0.4 MG SL tablet Place 1 tablet (0.4 mg total) under the tongue every 5 (five) minutes as needed for chest pain.  25 tablet  5   No current facility-administered medications for this visit.    Allergies:   No Known Allergies  Social History:  The patient  reports that she has never smoked. She has never used smokeless tobacco. She reports that she does not drink alcohol or use illicit drugs.   Family History:  The patient's family history is not on file.   ROS:  Please see the history of present illness.  No nausea, vomiting.  No fevers, chills.  No  focal weakness.  No dysuria.   All other systems reviewed and negative.   PHYSICAL EXAM: VS:  BP 102/58  Pulse 68  Ht 5\' 1"  (1.549 m)  Wt 151 lb (68.493 kg)  BMI 28.55 kg/m2  SpO2 94% Well nourished, well developed; frequent deep breaths HEENT: normal Neck: no JVD, no carotid bruits Cardiac:  normal S1, S2; RRR;  Lungs:  clear to auscultation bilaterally, no wheezing, rhonchi or rales Abd: soft, nontender, no hepatomegaly Ext: no edema Skin: warm and dry Neuro:   no focal abnormalities noted  EKG:  NSR, NSST     ASSESSMENT AND PLAN:  SHOB:  Will check LE venous DOppler today.  Left leg is swollen.  Check BNP , D-dimer today.  Dr. was also consider CT chest if [facility] persisted.  Given all of the airline travel, she would be at risk for DVT/PE.  Coronary atherosclerosis of native coronary artery  Refill Nitroglycerin Tablet Sublingual, 0.4 MG,  1 tablet under the tongue, Sublingual, as needed, 30 days, 25, Refills 3 Chest pain and back discomfort is more frequent. Plan ischemia w/u if PE w/u negative. Negative stress in 2013, September.   2. Mixed hyperlipidemia  Started Atorvastatin Calcium Tablet, 40 MG, 1 tablet, Orally, Once a day, 30 day(s), 30, Refills 11  LDL target is close to 70 given prior MI.  Checked by PCP  3. Essential hypertension, benign  Continue Hydrochlorothiazide Tablet, 25 MG, 1 tablet, Orally, Once a day. Controlled. Potassium 3.5. Normal creatinine in October 2014. Labs reviewed.  Normal BMet on 8/14.  D-dimer could not be done. Will check D-dimer and BNP here.    Further plans based on LE Doppler results.  Signed, 9/14, MD, [facility] 01/12/2014 4:14 PM

## 2014-01-12 NOTE — Patient Instructions (Signed)
Your physician recommends that you return for lab work today for D-Dimer and BNP.  Your physician has requested that you have a lower or upper extremity venous duplex today. This test is an ultrasound of the veins in the legs or arms. It looks at venous blood flow that carries blood from the heart to the legs or arms. Allow one hour for a Lower Venous exam. Allow thirty minutes for an Upper Venous exam. There are no restrictions or special instructions.   Your physician wants you to follow-up in: December of 2015. You will receive a reminder letter in the mail two months in advance. If you don't receive a letter, please call our office to schedule the follow-up appointment.

## 2014-01-13 LAB — BRAIN NATRIURETIC PEPTIDE: Pro B Natriuretic peptide (BNP): 102 pg/mL — ABNORMAL HIGH (ref 0.0–100.0)

## 2014-05-27 DIAGNOSIS — R42 Dizziness and giddiness: Secondary | ICD-10-CM

## 2014-05-27 DIAGNOSIS — A499 Bacterial infection, unspecified: Secondary | ICD-10-CM

## 2014-05-27 HISTORY — DX: Bacterial infection, unspecified: A49.9

## 2014-05-27 HISTORY — DX: Dizziness and giddiness: R42

## 2014-09-08 ENCOUNTER — Ambulatory Visit
Admission: RE | Admit: 2014-09-08 | Discharge: 2014-09-08 | Disposition: A | Discharge status: Home / Self Care | Payer: Medicare Other | Source: Ambulatory Visit | Attending: Internal Medicine | Admitting: Internal Medicine

## 2014-09-08 ENCOUNTER — Other Ambulatory Visit: Payer: Self-pay | Admitting: Internal Medicine

## 2014-09-08 DIAGNOSIS — L03211 Cellulitis of face: Secondary | ICD-10-CM

## 2014-09-08 DIAGNOSIS — R52 Pain, unspecified: Secondary | ICD-10-CM

## 2014-12-09 ENCOUNTER — Encounter: Payer: Self-pay | Admitting: Gastroenterology

## 2014-12-20 ENCOUNTER — Telehealth: Payer: Self-pay | Admitting: Interventional Cardiology

## 2014-12-20 NOTE — Telephone Encounter (Signed)
Received records from Ralene Ok MD, MRCP forwarded to Dr. Isabel Caprice via fax 5 pages 12/20/14

## 2015-05-02 ENCOUNTER — Other Ambulatory Visit: Payer: Self-pay | Admitting: Internal Medicine

## 2015-05-02 DIAGNOSIS — R51 Headache: Principal | ICD-10-CM

## 2015-05-02 DIAGNOSIS — R519 Headache, unspecified: Secondary | ICD-10-CM

## 2015-05-16 ENCOUNTER — Ambulatory Visit
Admission: RE | Admit: 2015-05-16 | Discharge: 2015-05-16 | Disposition: A | Discharge status: Home / Self Care | Payer: Medicare Other | Source: Ambulatory Visit | Attending: Internal Medicine | Admitting: Internal Medicine

## 2015-05-16 DIAGNOSIS — R519 Headache, unspecified: Secondary | ICD-10-CM

## 2015-05-16 DIAGNOSIS — R51 Headache: Principal | ICD-10-CM

## 2015-07-27 ENCOUNTER — Ambulatory Visit (INDEPENDENT_AMBULATORY_CARE_PROVIDER_SITE_OTHER): Payer: Medicare Other | Admitting: Neurology

## 2015-07-27 ENCOUNTER — Encounter: Payer: Self-pay | Admitting: Neurology

## 2015-07-27 VITALS — BP 143/76 | HR 53 | Ht 61.0 in | Wt 142.2 lb

## 2015-07-27 DIAGNOSIS — G3101 Pick's disease: Secondary | ICD-10-CM

## 2015-07-27 DIAGNOSIS — F0280 Dementia in other diseases classified elsewhere without behavioral disturbance: Secondary | ICD-10-CM | POA: Diagnosis not present

## 2015-07-27 NOTE — Progress Notes (Signed)
[facility]    Provider:  Dr Jaynee Eagles Referring Provider: Jilda Panda, MD Primary Care Physician:  Jilda Panda, MD  CC:  Aphasia  HPI:  Michelle [name] is a 82 y.o. female here as a referral from Dr. Mellody Drown for diplopia. Past medical history hypertension, hyperlipidemia, gastrectomy with vagotomy in 1983. Right breast cancer treated with lumpectomy and radiotherapy 2004, hypothyroidism, coronary artery disease status post MI and stent placement. 2014, diabetes mellitus type 2, osteoporosis and lumbar compression fraction and partial T12 compression, status post hysterectomy, hernia repair in 2001, peripheral neuropathy, primary progressive aphasia. They are here for difficulties in speech. She is here with her daughter provides most information. She has word-finding difficulty.  Daughter says there is no memory issue just has trouble verbalizing what she has to say. It has been several years. It is getting worse over time. She lives by herslef. She pays her bills and taxes. She hasn't forgotten to pay a bill. She lives in a home. She is also maybe having a difficult time understanding speech. Sometimes she can't name something, but she has to explain  What it is or what it does. It is getting worse, it is progressive. She knows what she is trying to say but can't say it. No dementia or neurocognitive disorders in the family however. younger sister having similar difficulty with speech. Vision changes started around thanksgiving after a bout with vertigo. That's when the double vision started again. She only sees double of people because they move. She is not seeing double of me right now. She says her left side of her face felt swollen. The vertigo resolved. She has been referred to a double vision expert.  reviewed images of MRI with patient and her daughter, see report below.   Reviewed notes, labs and imaging from outside physicians, which showed: Reviewed primary care notes. Patient  presented on December 2 to the office complaining of right-sided headache and double vision. She is also complaining of dizzy spells mainly on standing. She would have to hold onto objects. Doesn't use her walker at home and time. Routine blood tests showed reasonable diabetic control with diet with a hemoglobin A1c of 6.9.  Personally reviewed images of MRI findings 04/2015 and agree with below  FINDINGS: Diffusion imaging does not show any acute or subacute infarction.There is mild generalized brain atrophy but more advanced temporal lobe atrophy. There are mild chronic small-vessel ischemic changes affecting the pons. No cerebellar insult. The cerebral hemispheres show mild chronic small-vessel disease of the deep and subcortical white matter, less than often seen in healthy individuals of this age. No cortical or large vessel territory infarction. No mass lesion, hemorrhage, hydrocephalus or extra-axial collection. No pituitary mass. No inflammatory sinus disease. No skull or skullbase lesion.  IMPRESSION: Temporal lobe atrophy. Otherwise, mild generalized age related atrophy. Minimal small vessel change, less than often seen in healthy individuals of this age. No acute or reversible finding.  CMP unremarkable. Notes per primary care stated that he will check thyroid function, sedimentation rate and B12 levels. We'll defer this lab workup to primary care.  Review of Systems: Patient complains of symptoms per HPI as well as the following symptoms: Double vision, memory loss. No chest pain or shortness of breath.. Pertinent negatives per HPI. All others negative.   Social History   Social History  . Marital Status: Widowed    Spouse Name: N/A  . Number of Children: 7  . Years of Education: 70   Occupational  History  . Not on file.   Social History Main Topics  . Smoking status: Never Smoker   . Smokeless tobacco: Never Used  . Alcohol Use: No  . Drug Use: No  . Sexual Activity: No     Other Topics Concern  . Not on file   Social History Narrative   Lives at home alone   Caffeine use: daily    History reviewed. No pertinent family history.  Past Medical History  Diagnosis Date  . Hypertension   . Coronary artery disease   . Hypothyroidism   . Diabetes mellitus   . GERD (gastroesophageal reflux disease)   . Arthritis   . Myocardial infarction [facility]) [id number]    with stents  . Hyperlipidemia   . ASCVD (arteriosclerotic cardiovascular disease)     S/P NSTEMI with LCX stent 2008, cath on 06/29/2008 revealed widely patent circumflex  . Angina pectoris (HCC)     intrascapular, abnormal cardiolote preop TKR    Past Surgical History  Procedure Laterality Date  . Coronary angioplasty  2008, 2010  . Abdominal hysterectomy  1969  . Knee arthroscopy  1980 ,1994    bilateral  . Total gastrectomy  1983    with vagotomy  . Hernia repair  [id number]  . Breast surgery  2004    right -cancerous-with radiation  . Breast biopsy  2005    right -benign  . Foot ampuation  12/2009    right  . Hammer toe surgery  06/2009    right  . Total knee arthroplasty  02/26/2012    Procedure: TOTAL KNEE ARTHROPLASTY;  Surgeon: Tobi Bastos, MD;  Location: [facility]S;  Service: Orthopedics;  Laterality: Right;    Current Outpatient Prescriptions  Medication Sig Dispense Refill  . aspirin 81 MG tablet Take 81 mg by mouth daily.    . nitroGLYCERIN (NITROSTAT) 0.4 MG SL tablet Place 1 tablet (0.4 mg total) under the tongue every 5 (five) minutes as needed for chest pain. (Patient not taking: Reported on 07/27/2015) 25 tablet 5   No current facility-administered medications for this visit.    Allergies as of 07/27/2015  . (No Known Allergies)    Vitals: BP 143/76 mmHg  Pulse 53  Ht 5\' 1"  (1.549 m)  Wt 142 lb 3.2 oz (64.501 kg)  BMI 26.88 kg/m2 Last Weight:  Wt Readings from Last 1 Encounters:  07/27/15 142 lb 3.2 oz (64.501 kg)   Last Height:   Ht Readings from Last 1  Encounters:  07/27/15 5\' 1"  (1.549 m)    Physical exam: Exam: Gen: NAD, conversant, pleasant                CV: RRR, no MRG. No Carotid Bruits.  Eyes: Conjunctivae clear without exudates or hemorrhage  Neuro: Detailed Neurologic Exam  Speech:     word finding difficulties and difficulty expressing herself. Also some difficulty with comprehension as demonstrated by having to give directions multiple times during exam such as when asking her to lift leg for motor exam. Able to name simple objects like pen but had difficulty with more complicated objects such as stethoscope where she had to describe it as a tool that you need to check your heart.  Could repeat with mild difficulty.  Cognition:    The patient is oriented to person, place, and time;     recent and remote memory appear intact but difficult to examine thoroughly due to language comprehension and expression deficits;  language non fluent;     normal attention, concentration,     fund of knowledge appear intact Cranial Nerves:    The pupils are equal, round, and reactive to light. Attempted funduscopic exam could not visualize. Visual fields are full to finger confrontation. Extraocular movements are intact. Trigeminal sensation is intact and the muscles of mastication are normal. The face is symmetric. The palate elevates in the midline. Hearing intact. Voice is normal. Shoulder shrug is normal. The tongue has normal motion without fasciculations.   Coordination:    Normal finger to nose and heel to shin.   Gait:    No ataxia noted, mildly wide based  Motor Observation:    No asymmetry, no atrophy, and no involuntary movements noted. Tone:    Normal muscle tone.    Posture:    Posture is normal. normal erect    Strength:    Strength is V/V in the upper and lower limbs.      Sensation: intact to LT     Reflex Exam:  DTR's:    Deep tendon reflexes in the upper and lower extremities are brisk bilaterally.     Toes:    The toes are downgoing bilaterally.   Clonus:    Clonus is absent.      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 MRI with temporal lobe atrophy consistent with diagnosis. Had a lengthy discussion with daughter and patient. Did show daughter online documentation and discussed the diagnosis and reviewed MRI images and pointed the temporal lobe atrophy. This is a progressive neurocognitive disorder and I do recommend looking into assisted living facilities for patient at this time.  Attempted a [place] cognitive assessment test but patient became very frustrated and could not complete tasks, halfway through she had only scored about 3 points. The test was aborted due to patient's frustration.  Feel as though this is not an accurate representation of her memory due to inability to understand the questions due to primary progressive aphasia.  It's very difficult to assess patient's memory on testing due to her language deficits however at next appointment can discuss starting Aricept.   Speech therapy Notes per primary care stated that he will check thyroid function, sedimentation rate and B12 levels. We'll defer this lab workup to primary care  CC: Dr. Fritzi Mandes, MD  [facility] [address] [place], [place] [address]  Phone [phone] Fax [phone]

## 2015-07-27 NOTE — Patient Instructions (Signed)
Remember to drink plenty of fluid, eat healthy meals and do not skip any meals. Try to eat protein with a every meal and eat a healthy snack such as fruit or nuts in between meals. Try to keep a regular sleep-wake schedule and try to exercise daily, particularly in the form of walking, 20-30 minutes a day, if you can.   As far as diagnostic testing: Speech Therapy  I would like to see you back in 4 months, sooner if we need to. Please call us with any interim questions, concerns, problems, updates or refill requests.   Our phone number is [phone]. We also have an after hours call service for urgent matters and there is a physician on-call for urgent questions. For any emergencies you know to call 911 or go to the nearest emergency room

## 2015-07-28 ENCOUNTER — Ambulatory Visit: Payer: Medicare Other | Admitting: Neurology

## 2015-07-30 ENCOUNTER — Encounter: Payer: Self-pay | Admitting: Neurology

## 2015-07-30 DIAGNOSIS — G3101 Pick's disease: Principal | ICD-10-CM

## 2015-07-30 DIAGNOSIS — F0280 Dementia in other diseases classified elsewhere without behavioral disturbance: Secondary | ICD-10-CM | POA: Insufficient documentation

## 2015-09-14 ENCOUNTER — Other Ambulatory Visit: Payer: Self-pay | Admitting: Internal Medicine

## 2015-09-14 DIAGNOSIS — M79605 Pain in left leg: Principal | ICD-10-CM

## 2015-09-14 DIAGNOSIS — M79604 Pain in right leg: Secondary | ICD-10-CM

## 2015-09-15 ENCOUNTER — Ambulatory Visit: Payer: Medicare Other | Attending: Neurology

## 2015-09-15 DIAGNOSIS — R4701 Aphasia: Secondary | ICD-10-CM | POA: Diagnosis present

## 2015-09-15 DIAGNOSIS — R482 Apraxia: Secondary | ICD-10-CM | POA: Insufficient documentation

## 2015-09-15 NOTE — Patient Instructions (Signed)
Tips for Talking with People who have Aphasia  . Say one thing at a time . Don't  rush - slow down, be patient . Reduce background noise . Relax - be natural . Use pen and paper . Write down key words . Draw diagrams or pictures . Don't pretend you understand . Ask what helps . Recap - check you both understand   Describing words  What group does it belong to?  What do I use it for?  Where can I find it?  What does it LOOK like?  What other words go with it?  What is the 1st sound of the word?  Many Ways to Communicate  Describe it Write it Draw it Gesture it Use related words  Apps that may help expressive aphasia: Family Feud, Heads up, Stop-fun categories, What if, Junie Bame  Board games/other things that may help: Crossword puzzles, Taboo, Scrabble, Scattergories

## 2015-09-15 NOTE — Therapy (Signed)
[facility] [address] [place], [place], [address] Phone: [phone]   Fax:  [phone]  Speech Language Pathology Evaluation  Patient Details  Name: Michelle [name] MRN: [id number] Date of Birth: [birth year] Referring Provider: Vashti Hey M.D.  Encounter Date: 09/15/2015      End of Session - 09/15/15 1536    Visit Number 1   Number of Visits 17   Date for SLP Re-Evaluation 11/17/15   SLP Start Time 1017   SLP Stop Time  1100   SLP Time Calculation (min) 43 min   Activity Tolerance Patient tolerated treatment well      Past Medical History  Diagnosis Date  . Hypertension   . Coronary artery disease   . Hypothyroidism   . Diabetes mellitus   . GERD (gastroesophageal reflux disease)   . Arthritis   . Myocardial infarction [facility]) [id number]    with stents  . Hyperlipidemia   . ASCVD (arteriosclerotic cardiovascular disease)     S/P NSTEMI with LCX stent 2008, cath on 06/29/2008 revealed widely patent circumflex  . Angina pectoris (HCC)     intrascapular, abnormal cardiolote preop TKR  . Vertigo 2016  . Bacterial infection 2016  . Rheumatoid arthritis (HCC) 2013  . Trigger finger 02/2013  . Heart attack (HCC) 2010    Past Surgical History  Procedure Laterality Date  . Coronary angioplasty  2008, 2010  . Abdominal hysterectomy  1969  . Knee arthroscopy  1980 ,1994    bilateral  . Total gastrectomy  1983    with vagotomy  . Hernia repair  [id number]  . Breast surgery  2004    right -cancerous-with radiation  . Breast biopsy  2005    right -benign  . Foot ampuation  12/2009    right  . Hammer toe surgery  06/2009    right  . Total knee arthroplasty  02/26/2012    Procedure: TOTAL KNEE ARTHROPLASTY;  Surgeon: Jacki Cones, MD;  Location: [facility];  Service: Orthopedics;  Laterality: Right;  . Broken finger  2006    There were no vitals filed for this visit.      Subjective Assessment -  09/15/15 1026    Subjective Pt with vague explanation about her difficulty with speech omissions, mostly, which made explanation very challenging to comprehend. Faster than average rate of speech.   Patient is accompained by: Family member  Daughter in law [place]            SLP Evaluation [facility] - 09/15/15 1026    SLP Visit Information   SLP Received On 09/15/15   Referring Provider Vashti Hey M.D.   Onset Date 4-5 years ago   Medical Diagnosis Primary Progressive Aphasia   Pain Assessment   Currently in Pain? Yes   Pain Score 4    Pain Location Leg   Pain Orientation Left   Pain Type Chronic pain   Pain Onset More than a month ago   Pain Frequency Constant   Pain Relieving Factors walking   Effect of Pain on Daily Activities limits walking early in the morning   General Information   HPI Pt with difficlty with verbal and written expression for approx 4-5 years, now at a debilitating level.    Prior Functional Status    Lives With Alone   Available Support Family   Vocation Retired   Cognition   Overall Cognitive Status Difficult to assess   Difficult to assess due to  Impaired communication   Auditory Comprehension   Overall Auditory Comprehension Impaired   Conversation Moderately complex   Other Conversation Comments Pt req'd usual  (50-80% frequency) repeats of evaluation information   EffectiveTechniques Extra processing time;Slowed speech;Pausing;Repetition   Overall Auditory Comprehension Comments Pt with difficulty in mod complex language or language without context.    Reading Comprehension   Reading Status --   Paragraph Level --   Effective Techniques --   Expression   Primary Mode of Expression Verbal  gestures rarely, assist from family   Verbal Expression   Overall Verbal Expression Impaired   Initiation No impairment   Automatic Speech Month of year  appeared to have apraxic-like pauses and hesitations   Repetition Impaired   Level of Impairment  Phrase level   Other Verbal Expression Comments Paraphasias (she/he, say/know, section/office) in conversation along with motor speech deficits (possibly as compensation for apraxia-like symptoms?) made simple conversation challenging for this trained listener. In reading tasks, when pt slowed rate her speech was more fluid. Pt was only minimally successful today with rate reduction in spoken language.    Motor Speech   Overall Motor Speech Impaired   Motor Planning Impaired   Level of Impairment Phrase   Motor Speech Errors Groping for words;Consistent   Interfering Components --  incr'd rate of speech   Effective Techniques Slow rate   Standardized Assessments   Standardized Assessments  Boston Naming Test-2nd edition  attempted, but SLP abaondoned due to apraxia                         SLP Education - 09/15/15 1535    Education provided Yes   Education Details primary progressive aphasia description, copmensatory measures for family for auditory comprehension, rate reduction necessary to incr speech fluidity, aphasia tips for pt/family   Person(s) Educated Patient;Caregiver(s)   Methods Explanation;Demonstration;Verbal cues;Handout   Comprehension Verbalized understanding;Verbal cues required;Need further instruction          SLP Short Term Goals - 09/15/15 1701    SLP SHORT TERM GOAL #1   Title pt will communicate simple wants and needs using compensations for verbal communication    Time 4   Period Weeks   Status New   SLP SHORT TERM GOAL #2   Title pt will use compensations in simple naming tasks (divergent, convergent, responsive, etc) for 85% success   Time 4   Period Weeks   Status New          SLP Long Term Goals - 09/15/15 1703    SLP LONG TERM GOAL #1   Title pt will use compensations in naming tasks (divergent, convergent, responsive, etc) for 90% success   Time 8   Period Weeks   Status New   SLP LONG TERM GOAL #2   Title pt will  communicate in simple conversation with modified independence (compensations )   Time 8   Period Weeks   Status New   SLP LONG TERM GOAL #3   Title pt will demo understanding of simple conversation with family using compensations (ask for repeat, family slowing speech rate, etc)   Time 8   Period Weeks   Status New   SLP LONG TERM GOAL #4   Title pt will have a feasible plan for communication for the future   Time 8   Period Weeks   Status New          Plan - 09/15/15 1537  Clinical Impression Statement Pt presents today with mod to severe expressive language and motor speech deficits, including written language per pt. There is a receptive (likely moderate) language deficit as well. Pt's speech is preserved mostly for word level and short phrase level utterances, but a simple conversation with the patient at this time is challenging from receptive and expressive standpoints due to s/s verbal apraxia (articulatory groping during verbaliztion of words/phrases/sentences), and paraphasias. Reduced rate of speakers assists pt's receptive language accuracy. If not, SLP had to repeat multiple times and then pt may still not have understood comment/question, and this is worse when linguistic context is unknown to pt. Skilled ST is warranted at this time to address compensations for expressive language - the most profitable likely being slowed speech rate as pt has/had fast rate of speech. In reading, this proved very successful at incr'ing pt fluidity of speech and language output.   Speech Therapy Frequency 2x / week   Duration --  8 weeks   Treatment/Interventions Compensatory strategies;Patient/family education;Functional tasks;Multimodal communcation approach;SLP instruction and feedback;Language facilitation   Potential to Achieve Goals Fair   Potential Considerations Severity of impairments;Medical prognosis   Consulted and Agree with Plan of Care Patient      Patient will benefit  from skilled therapeutic intervention in order to improve the following deficits and impairments:   Apraxia  Aphasia      G-Codes - 2015/10/12 1712    Functional Assessment Tool Used noms- 3 (65% impaired )   Functional Limitations Motor speech   Motor Speech Current Status [phone]) At least 60 percent but less than 80 percent impaired, limited or restricted   Motor Speech Goal Status ([id number]) At least 40 percent but less than 60 percent impaired, limited or restricted      Problem List Patient Active Problem List   Diagnosis Date Noted  . Primary progressive aphasia 07/30/2015  . Mixed hyperlipidemia 05/04/2013  . Hypertension   . Coronary artery disease   . Hypothyroidism   . GERD (gastroesophageal reflux disease)   . Arthritis   . Myocardial infarction (HCC)   . Osteoarthritis of right knee 02/26/2012    [facility] ,MS, CCC-SLP   10/12/2015, 5:16 PM  [facility] [address] [place], [place], [address] Phone: [phone]   Fax:  [phone]  Name: Michelle [name] MRN: [id number] Date of Birth: [birth year]

## 2015-09-20 ENCOUNTER — Ambulatory Visit: Payer: Medicare Other

## 2015-09-20 DIAGNOSIS — R4701 Aphasia: Secondary | ICD-10-CM

## 2015-09-20 DIAGNOSIS — R482 Apraxia: Secondary | ICD-10-CM | POA: Diagnosis not present

## 2015-09-20 NOTE — Therapy (Signed)
[facility] [address] [place], [place], [address] Phone: [phone]   Fax:  [phone]  Speech Language Pathology Treatment  Patient Details  Name: Michelle [name] MRN: [id number] Date of Birth: November 02, 1927 Referring Provider: Vashti Hey M.D.  Encounter Date: 09/20/2015      End of Session - 09/20/15 1707    Visit Number 2   Number of Visits 17   Date for SLP Re-Evaluation 11/17/15   SLP Start Time 0850   SLP Stop Time  0934   SLP Time Calculation (min) 44 min   Activity Tolerance Patient tolerated treatment well      Past Medical History  Diagnosis Date  . Hypertension   . Coronary artery disease   . Hypothyroidism   . Diabetes mellitus   . GERD (gastroesophageal reflux disease)   . Arthritis   . Myocardial infarction [facility]) [phone]    with stents  . Hyperlipidemia   . ASCVD (arteriosclerotic cardiovascular disease)     S/P NSTEMI with LCX stent 2008, cath on 06/29/2008 revealed widely patent circumflex  . Angina pectoris (HCC)     intrascapular, abnormal cardiolote preop TKR  . Vertigo 2016  . Bacterial infection 2016  . Rheumatoid arthritis (HCC) 2013  . Trigger finger 02/2013  . Heart attack (HCC) 2010    Past Surgical History  Procedure Laterality Date  . Coronary angioplasty  2008, 2010  . Abdominal hysterectomy  1969  . Knee arthroscopy  1980 ,1994    bilateral  . Total gastrectomy  1983    with vagotomy  . Hernia repair  [id number]  . Breast surgery  2004    right -cancerous-with radiation  . Breast biopsy  2005    right -benign  . Foot ampuation  12/2009    right  . Hammer toe surgery  06/2009    right  . Total knee arthroplasty  02/26/2012    Procedure: TOTAL KNEE ARTHROPLASTY;  Surgeon: Jacki Cones, MD;  Location: [facility];  Service: Orthopedics;  Laterality: Right;  . Broken finger  2006    There were no vitals filed for this visit.      Subjective Assessment -  09/20/15 0858    Subjective Pt communicates today she does not desire ST and family is pressing her to complete ST. "It's not going to get any better."    Currently in Pain? Yes   Pain Score 4    Pain Location Leg   Pain Orientation Left               ADULT SLP TREATMENT - 09/20/15 0902    General Information   Behavior/Cognition Alert;Cooperative;Pleasant mood   Treatment Provided   Treatment provided Cognitive-Linquistic   Cognitive-Linquistic Treatment   Treatment focused on Apraxia;Aphasia   Skilled Treatment SLP and pt and pt's daughter in law spoke about pt's understanding of what ST would entail. Pt communicated she does not want to complete structured therapy tasks due to her speech skills further declining. SLP told pt a number of times expalined in different ways that SLP could assist pt/children and wives how best to communicate, given her ability to speak will decr over time. Pt attempted to reduce rate approx 30-40% of the time which did improve listener's copmrehension. Michelle [name] cont to speak with fast rate of speech, requiring SLP cues to slow rate. With reduced speaker rate, pt demo'd better comprehension today however answered some questions  or commented in ways that did not  demo understanding of speakers' comments or questions of her.   Assessment / Recommendations / Plan   Plan Goals updated   Progression Toward Goals   Progression toward goals Progressing toward goals            SLP Short Term Goals - 09/20/15 1711    SLP SHORT TERM GOAL #1   Title pt will communicate simple wants and needs using compensations for verbal communication with requests from listener to repeat   Time 4   Period Weeks   Status Revised          SLP Long Term Goals - 09/20/15 1712    SLP LONG TERM GOAL #1   Title pt will use compensations in naming tasks (divergent, convergent, responsive, etc) for 90% success   Time 8   Period Weeks   Status Deferred   SLP LONG TERM GOAL  #2   Title pt will communicate in simple conversation with slowed rate 70% of the time   Time 8   Period Weeks   Status Revised   SLP LONG TERM GOAL #3   Title pt will demo understanding of simple conversation with family using compensations (ask for repeat, family slowing speech rate, etc)   Time 8   Period Weeks   Status On-going   SLP LONG TERM GOAL #4   Title pt will have a feasible plan for communication for the future   Time 8   Period Weeks   Status On-going          Plan - 09/20/15 1708    Clinical Impression Statement Mod to severe expressive language and motor speech deficits continue, including receptive (likely moderate) language. Reduced rate of speaker cont to assist pt's receptive language accuracy. Pt would like to focus on means of improving communication between family and pt. Goals were modified accordingly. She would also like to reduce frequency from x2/week to x1/week due to other social obligations.   Speech Therapy Frequency 1x /week   Duration --  8 weeks   Treatment/Interventions Compensatory strategies;Patient/family education;Functional tasks;Multimodal communcation approach;SLP instruction and feedback;Language facilitation   Potential to Achieve Goals Fair   Potential Considerations Severity of impairments;Medical prognosis   Consulted and Agree with Plan of Care Patient      Patient will benefit from skilled therapeutic intervention in order to improve the following deficits and impairments:   Aphasia  Apraxia    Problem List Patient Active Problem List   Diagnosis Date Noted  . Primary progressive aphasia 07/30/2015  . Mixed hyperlipidemia 05/04/2013  . Hypertension   . Coronary artery disease   . Hypothyroidism   . GERD (gastroesophageal reflux disease)   . Arthritis   . Myocardial infarction (HCC)   . Osteoarthritis of right knee 02/26/2012    [facility] ,MS, CCC-SLP   09/20/2015, 5:13 PM  [facility] [address] [place], [place], [address] Phone: [phone]   Fax:  [phone]   Name: Michelle [name] MRN: [id number] Date of Birth: Nov 10, 1927

## 2015-09-20 NOTE — Patient Instructions (Signed)
Talk with Karena Addison and your sons and get back to me what you want for frequency of therapy. I am fine with whatever you choose.

## 2015-09-21 ENCOUNTER — Ambulatory Visit
Admission: RE | Admit: 2015-09-21 | Discharge: 2015-09-21 | Disposition: A | Discharge status: Home / Self Care | Payer: Medicare Other | Source: Ambulatory Visit | Attending: Internal Medicine | Admitting: Internal Medicine

## 2015-09-21 DIAGNOSIS — M79605 Pain in left leg: Principal | ICD-10-CM

## 2015-09-21 DIAGNOSIS — M79604 Pain in right leg: Secondary | ICD-10-CM

## 2015-10-09 NOTE — Therapy (Signed)
[facility] [address] [place], [place], [address] Phone: [phone]   Fax:  [phone]  Patient Details  Name: Michelle [name] MRN: [id number] Date of Birth: 24-Aug-1927 Referring Provider:  No ref. provider found  Encounter Date: 10/09/2015  SPEECH THERAPY DISCHARGE SUMMARY  Visits from Start of Care: 2  Current functional level related to goals / functional outcomes:  During her first therapy session she repeatedly stated she knew her condition was progressive. SLP and pt's caregiver repeatedly told her therapy would also be targeting how to communicate BEST with her family and friends, given her progressive condition. SLP encouraged pt to talk over with family and inform SLP of her decision if she decides to cancel remaining appointments. Pt called to cancel all remaining ST on 10/02/15. Pt knows if she talks with a reduced speaking rate that her speech is more fluid and intelligible, otherwise simple conversation is almost non-functional without questioning cues from her listener.   Remaining deficits: Because pt called to cancel therapy appointments after one treatment, all deficits assumed to remain.   Education / Equipment: Compensations for pt's verbal expression, info re: primary progressive aphasia.  Plan: Patient agrees to discharge.  Patient goals were not met. Patient is being discharged due to the patient's request.  ?????        [facility] ,[place], [place]  10/09/2015, 8:23 AM  [facility] [address] [place], [place], [address] Phone: [phone]   Fax:  [phone]

## 2015-10-12 ENCOUNTER — Encounter (INDEPENDENT_AMBULATORY_CARE_PROVIDER_SITE_OTHER): Payer: Medicare Other | Admitting: Ophthalmology

## 2015-10-27 ENCOUNTER — Ambulatory Visit: Payer: Medicare Other

## 2015-12-27 ENCOUNTER — Telehealth: Payer: Self-pay

## 2015-12-27 NOTE — Telephone Encounter (Signed)
**Note De-Identified  Obfuscation** This message has been faxed to Tomasita Crumble ATTN: Laney Pastor @ [phone]. I did receive confirmation that the fax was successful.

## 2015-12-27 NOTE — Telephone Encounter (Addendum)
**Note De-Identified  Obfuscation** The pt needs to have surgery on her Left middle finger: left long finger A1 pulley release on 01/17/16 with Dr Bradly Bienenstock with [facility].  The pt has not seen Dr Eldridge Dace since 01/12/14 but has appt scheduled with Lonzo Cloud, PA-c on 8/17.  They are requesting permission for the pt to stop taking Asa 81 mg daily prior to surgery and if permission is given when should she stop taking Asa?  Please advise.

## 2015-12-27 NOTE — Telephone Encounter (Signed)
OK to hold aspirin 1 week before the surgery.  No further cardiac testing needed before finger surgery.

## 2016-01-11 ENCOUNTER — Encounter (INDEPENDENT_AMBULATORY_CARE_PROVIDER_SITE_OTHER): Payer: Self-pay

## 2016-01-11 ENCOUNTER — Ambulatory Visit (INDEPENDENT_AMBULATORY_CARE_PROVIDER_SITE_OTHER): Payer: Medicare Other | Admitting: Cardiology

## 2016-01-11 ENCOUNTER — Encounter: Payer: Self-pay | Admitting: Cardiology

## 2016-01-11 VITALS — BP 138/52 | HR 61 | Ht 61.0 in | Wt 140.8 lb

## 2016-01-11 DIAGNOSIS — Z01818 Encounter for other preprocedural examination: Secondary | ICD-10-CM | POA: Diagnosis not present

## 2016-01-11 MED ORDER — NITROGLYCERIN 0.4 MG SL SUBL: 0.4000 mg | SUBLINGUAL_TABLET | SUBLINGUAL | 5 refills | Status: DC | PRN

## 2016-01-11 NOTE — Patient Instructions (Addendum)
Medication Instructions:   Your physician recommends that you continue on your current medications as directed. Please refer to the Current Medication list given to you today.   If you need a refill on your cardiac medications before your next appointment, please call your pharmacy.  Labwork: NONE ORDER TODAY    Testing/Procedures: NONE ORDER TODAY    Follow-Up:  WITH DR Eldridge Dace AS NEEDED FOR ANY CARDIAC RELATED SYMPTOMS   Any Other Special Instructions Will Be Listed Below (If Applicable).

## 2016-01-11 NOTE — Progress Notes (Signed)
01/11/2016 Michelle [name]   Jan 16, 1928  [id number]  Primary Physician Ralene Ok, MD Primary Cardiologist: Dr. Eldridge Dace   Reason for Visit/CC: Pre-operative Clearance for Left Hand Surgery  HPI:  82 y/o female who has a h/o CAD/ previous h/o MI, per office notes. She had a stent for her MI and required another stent 2 years later (per previous office notes, no cath report in electronic record). She has done well from a cardiac standpoint. Negative stress test in 9/13.  No issues with HTN nor DLD. The only medication she takes is OTC ASA. She has had issues with orthopedic problems. She has had hip replacement. She ended up having foot surgery and had complications from that in 2013. She had repeat procedures including a toe amputation in 2013.   She presents to clinic now for pre-operative clearance for left hand surgery, which will be done with a local anesthetic. She will not require surgery.   From a cardiac standpoint, she denies any symptoms of CP. No dyspnea. She ambulates w/o difficulty. EKG show NSR and is unchanged from prior EKG. Physical exam is unremarkable.    Current Outpatient Prescriptions  Medication Sig Dispense Refill  . aspirin 81 MG tablet Take 81 mg by mouth as directed.     . nitroGLYCERIN (NITROSTAT) 0.4 MG SL tablet Place 1 tablet (0.4 mg total) under the tongue every 5 (five) minutes as needed for chest pain. 25 tablet 5   No current facility-administered medications for this visit.     No Known Allergies  Social History   Social History  . Marital status: Widowed    Spouse name: N/A  . Number of children: 7  . Years of education: 55   Occupational History  . Not on file.   Social History Main Topics  . Smoking status: Never Smoker  . Smokeless tobacco: Never Used  . Alcohol use No  . Drug use: No  . Sexual activity: No   Other Topics Concern  . Not on file   Social History Narrative   Lives at home alone   Caffeine use: daily     Review  of Systems: General: negative for chills, fever, night sweats or weight changes.  Cardiovascular: negative for chest pain, dyspnea on exertion, edema, orthopnea, palpitations, paroxysmal nocturnal dyspnea or shortness of breath Dermatological: negative for rash Respiratory: negative for cough or wheezing Urologic: negative for hematuria Abdominal: negative for nausea, vomiting, diarrhea, bright red blood per rectum, melena, or hematemesis Neurologic: negative for visual changes, syncope, or dizziness All other systems reviewed and are otherwise negative except as noted above.    Blood pressure (!) 138/52, pulse 61, height 5\' 1"  (1.549 m), weight 140 lb 12.8 oz (63.9 kg).  General appearance: alert, cooperative, no distress Neck: no carotid bruit and no JVD Lungs: clear to auscultation bilaterally Heart: regular rate and rhythm, S1, S2 normal, no murmur, click, rub or gallop Extremities: no LEE, left middle finger in fixed flexed position + edema Pulses: 2+ and symmetric Skin: Skin color, texture, turgor normal. No rashes or lesions Neurologic: Grossly normal  EKG NSR. Unchanged from previous.   ASSESSMENT AND PLAN:   1. CAD: Patient with remote history of MI and previous coronary stenting. However she has remained stable without anginal symptomatology. No chest pain or dyspnea. EKG shows normal sinus rhythm and is unchanged from previous EKG. Aspirin will remain on hold during the perioperative period. She is to resume this once cleared by her orthopedic surgeon.  2. Surgical clearance: Patient is scheduled to undergo noncardiac surgery under local anesthesia. She is low risk for cardiac complications. Okay to proceed for surgery with no further testing. Okay to hold aspirin temporarily during the perioperative period.  PLAN  F/u with Dr. Eldridge Dace in 1 year.  Robbie Lis PA-C 01/11/2016 3:06 PM

## 2017-06-26 DIAGNOSIS — M1990 Unspecified osteoarthritis, unspecified site: Secondary | ICD-10-CM | POA: Insufficient documentation

## 2017-09-19 ENCOUNTER — Encounter: Payer: Self-pay | Admitting: Podiatry

## 2017-09-19 ENCOUNTER — Ambulatory Visit (INDEPENDENT_AMBULATORY_CARE_PROVIDER_SITE_OTHER): Payer: Medicare Other | Admitting: Podiatry

## 2017-09-19 VITALS — BP 140/82 | HR 62

## 2017-09-19 DIAGNOSIS — L84 Corns and callosities: Secondary | ICD-10-CM

## 2017-09-19 DIAGNOSIS — B351 Tinea unguium: Secondary | ICD-10-CM | POA: Diagnosis not present

## 2017-09-19 DIAGNOSIS — M79609 Pain in unspecified limb: Secondary | ICD-10-CM

## 2017-09-19 DIAGNOSIS — M21619 Bunion of unspecified foot: Secondary | ICD-10-CM

## 2017-09-21 NOTE — Progress Notes (Signed)
Subjective:   Patient ID: Michelle [name], female   DOB: 84 y.o.   MRN: [id number]   HPI Patient presents with caregiver stating that she has a lesion underneath the right foot that is become painful and nail disease 1-5 both feet patient states that she is having trouble walking on this and patient currently has never smoked and likes to be active   Review of Systems  All other systems reviewed and are negative.       Objective:  Physical Exam  Constitutional: She appears well-developed and well-nourished.  Cardiovascular: Intact distal pulses.  Pulmonary/Chest: Effort normal.  Musculoskeletal: Normal range of motion.  Neurological: She is alert.  Skin: Skin is warm.  Nursing note and vitals reviewed.   Neurovascular status was found to be intact and muscle strength was mildly diminished.  Patient does have thin skin formation and is found to have structural bunion deformity bilateral that is been present for a long time.  Patient has very thick callus lesion underneath the first metatarsal head right and thick yellow brittle nailbeds 1-5 both feet that are painful and impossible for her to cut.  Patient does have some dementia and was found to have good digital flow     Assessment:  Painful mycotic nail infection 1-5 both feet with structural bunion and lesion plantar aspect first metatarsal that is painful with palpation     Plan:  H&P education rendered and x-rays discussed.  Today debridement of lesion was accomplished with no iatrogenic bleeding with sterile instrumentation and debridement of nailbeds with no iatrogenic bleeding was noted.  Patient will be seen back to recheck  X-ray indicates that there is mild osteoporosis structural bunion but no other pathology

## 2017-10-17 ENCOUNTER — Encounter (HOSPITAL_COMMUNITY): Payer: Self-pay | Admitting: Emergency Medicine

## 2017-10-17 ENCOUNTER — Emergency Department (HOSPITAL_COMMUNITY): Payer: Medicare Other

## 2017-10-17 ENCOUNTER — Other Ambulatory Visit: Payer: Self-pay

## 2017-10-17 ENCOUNTER — Emergency Department (HOSPITAL_COMMUNITY)
Admission: EM | Admit: 2017-10-17 | Discharge: 2017-10-17 | Disposition: A | Discharge status: Home / Self Care | Payer: Medicare Other | Attending: Emergency Medicine | Admitting: Emergency Medicine

## 2017-10-17 DIAGNOSIS — W1830XA Fall on same level, unspecified, initial encounter: Secondary | ICD-10-CM | POA: Insufficient documentation

## 2017-10-17 DIAGNOSIS — R51 Headache: Secondary | ICD-10-CM | POA: Diagnosis not present

## 2017-10-17 DIAGNOSIS — Y939 Activity, unspecified: Secondary | ICD-10-CM | POA: Insufficient documentation

## 2017-10-17 DIAGNOSIS — M545 Low back pain: Secondary | ICD-10-CM | POA: Diagnosis not present

## 2017-10-17 DIAGNOSIS — Y999 Unspecified external cause status: Secondary | ICD-10-CM | POA: Diagnosis not present

## 2017-10-17 DIAGNOSIS — E119 Type 2 diabetes mellitus without complications: Secondary | ICD-10-CM | POA: Diagnosis not present

## 2017-10-17 DIAGNOSIS — W19XXXA Unspecified fall, initial encounter: Secondary | ICD-10-CM

## 2017-10-17 DIAGNOSIS — E039 Hypothyroidism, unspecified: Secondary | ICD-10-CM | POA: Diagnosis not present

## 2017-10-17 DIAGNOSIS — M25551 Pain in right hip: Secondary | ICD-10-CM | POA: Diagnosis not present

## 2017-10-17 DIAGNOSIS — Y929 Unspecified place or not applicable: Secondary | ICD-10-CM | POA: Insufficient documentation

## 2017-10-17 DIAGNOSIS — I10 Essential (primary) hypertension: Secondary | ICD-10-CM | POA: Insufficient documentation

## 2017-10-17 DIAGNOSIS — I252 Old myocardial infarction: Secondary | ICD-10-CM | POA: Insufficient documentation

## 2017-10-17 DIAGNOSIS — Z043 Encounter for examination and observation following other accident: Secondary | ICD-10-CM | POA: Diagnosis present

## 2017-10-17 NOTE — ED Notes (Signed)
Bed: ZO10 Expected date:  Expected time:  Means of arrival:  Comments: 84 yo fall

## 2017-10-17 NOTE — Discharge Instructions (Addendum)
Follow-up with your family doctor next week for recheck.  Return if any problems °

## 2017-10-17 NOTE — ED Triage Notes (Signed)
Per [facility], Pt from home mechanical fall this morning, no LOC, coccyx pain, small hematoma on back of head.

## 2017-10-17 NOTE — ED Provider Notes (Signed)
[facility]-EMERGENCY DEPT Provider Note   CSN: [id number] Arrival date & time: 10/17/17  1546     History   Chief Complaint Chief Complaint  Patient presents with  . Fall    HPI Michelle [name] is a 84 y.o. female.  Patient states that she slipped and fell and hit her head and lower part of her back.  She complains of mild headache.  No loss of consciousness and some right hip discomfort  The history is provided by the patient. No language interpreter was used.  Fall  This is a new problem. The current episode started 12 to 24 hours ago. The problem occurs constantly. The problem has been resolved. Associated symptoms include headaches. Pertinent negatives include no chest pain and no abdominal pain. Exacerbated by: movement. Nothing relieves the symptoms. She has tried nothing for the symptoms. The treatment provided no relief.    Past Medical History:  Diagnosis Date  . Angina pectoris (HCC)    intrascapular, abnormal cardiolote preop TKR  . Arthritis   . ASCVD (arteriosclerotic cardiovascular disease)    S/P NSTEMI with LCX stent 2008, cath on 06/29/2008 revealed widely patent circumflex  . Bacterial infection 2016  . Coronary artery disease   . Diabetes mellitus   . GERD (gastroesophageal reflux disease)   . Heart attack (HCC) 2010  . Hyperlipidemia   . Hypertension   . Hypothyroidism   . Myocardial infarction [facility]) [id number]   with stents  . Rheumatoid arthritis (HCC) 2013  . Trigger finger 02/2013  . Vertigo 2016    Patient Active Problem List   Diagnosis Date Noted  . Osteoarthritis 06/26/2017  . Primary progressive aphasia 07/30/2015  . Mixed hyperlipidemia 05/04/2013  . Hypertension   . Coronary artery disease   . Hypothyroidism   . GERD (gastroesophageal reflux disease)   . Arthritis   . Myocardial infarction (HCC)   . Osteoarthritis of right knee 02/26/2012    Past Surgical History:  Procedure Laterality Date  . ABDOMINAL  HYSTERECTOMY  1969  . BREAST BIOPSY  2005   right -benign  . BREAST SURGERY  2004   right -cancerous-with radiation  . broken finger  2006  . CORONARY ANGIOPLASTY  2008, 2010  . foot ampuation  12/2009   right  . HAMMER TOE SURGERY  06/2009   right  . HERNIA REPAIR  [id number]  . KNEE ARTHROSCOPY  1980 ,1994   bilateral  . TOTAL GASTRECTOMY  1983   with vagotomy  . TOTAL KNEE ARTHROPLASTY  02/26/2012   Procedure: TOTAL KNEE ARTHROPLASTY;  Surgeon: Jacki Cones, MD;  Location: [facility]S;  Service: Orthopedics;  Laterality: Right;     OB History   None      Home Medications    Prior to Admission medications   Medication Sig Start Date End Date Taking? Authorizing Provider  l-methylfolate-B6-B12 (METANX) 3-35-2 MG TABS tablet Take 1 tablet by mouth daily.   Yes [provider]  nitroGLYCERIN (NITROSTAT) 0.4 MG SL tablet Place 1 tablet (0.4 mg total) under the tongue every 5 (five) minutes as needed for chest pain. Patient not taking: Reported on 10/17/2017 01/11/16   Allayne Butcher, PA-C    Family History Family History  Problem Relation Age of Onset  . Dementia Neg Hx     Social History Social History   Tobacco Use  . Smoking status: Never Smoker  . Smokeless tobacco: Never Used  Substance Use Topics  . Alcohol use: No  .  Drug use: No     Allergies   Other   Review of Systems Review of Systems  Constitutional: Negative for appetite change and fatigue.  HENT: Negative for congestion, ear discharge and sinus pressure.   Eyes: Negative for discharge.  Respiratory: Negative for cough.   Cardiovascular: Negative for chest pain.  Gastrointestinal: Negative for abdominal pain and diarrhea.  Genitourinary: Negative for frequency and hematuria.  Musculoskeletal: Negative for back pain.  Skin: Negative for rash.  Neurological: Positive for headaches. Negative for seizures.  Psychiatric/Behavioral: Negative for hallucinations.     Physical  Exam Updated Vital Signs BP 132/66   Pulse (!) 55   Temp 98.3 F (36.8 C) (Oral)   Resp 16   SpO2 99%   Physical Exam  Constitutional: She is oriented to person, place, and time. She appears well-developed.  HENT:  Head: Normocephalic.  Tenderness posterior head.  Mild tenderness to posterior neck  Eyes: Conjunctivae and EOM are normal. No scleral icterus.  Neck: Neck supple. No thyromegaly present.  Cardiovascular: Normal rate and regular rhythm. Exam reveals no gallop and no friction rub.  No murmur heard. Pulmonary/Chest: No stridor. She has no wheezes. She has no rales. She exhibits no tenderness.  Abdominal: She exhibits no distension. There is no tenderness. There is no rebound.  Musculoskeletal: Normal range of motion. She exhibits no edema.  Tender lower lumbar spine.  Minimal tenderness to right hip with full range of motion  Lymphadenopathy:    She has no cervical adenopathy.  Neurological: She is oriented to person, place, and time. She exhibits normal muscle tone. Coordination normal.  Skin: No rash noted. No erythema.  Psychiatric: She has a normal mood and affect. Her behavior is normal.     ED Treatments / Results  Labs (all labs ordered are listed, but only abnormal results are displayed) Labs Reviewed - No data to display  EKG None  Radiology Dg Lumbar Spine 2-3 Views  Result Date: 10/17/2017 CLINICAL DATA:  Pain after fall this morning. EXAM: LUMBAR SPINE - 2-3 VIEW COMPARISON:  None. FINDINGS: There is levoconvex scoliotic curvature of the lumbar spine. Degenerative grade 1 anterolisthesis of L4 on L5 by 8 mm is identified likely secondary to degenerative disc and facet arthropathy. Multilevel facet hypertrophy, sclerosis and joint space narrowing is noted from L2 through S1. Mild disc space narrowing L1 through L3 and at L4-5 and L5-S1. Osteoarthritis of the SI joints and pubic symphysis. No acute fracture or suspicious osseous lesions. Suture material and  staples are seen in the upper abdomen. IMPRESSION: 1. Levoscoliosis with lumbar spondylosis. 2. Grade 1 anterolisthesis of L4 on L5 likely degenerative disc and facet mediated. 3. No acute osseous abnormality. 4. Osteoarthritis of the SI joints and pubic symphysis. Electronically Signed   By: Tollie Eth M.D.   On: 10/17/2017 17:43   Ct Head Wo Contrast  Result Date: 10/17/2017 CLINICAL DATA:  Pain following fall EXAM: CT HEAD WITHOUT CONTRAST CT CERVICAL SPINE WITHOUT CONTRAST TECHNIQUE: Multidetector CT imaging of the head and cervical spine was performed following the standard protocol without intravenous contrast. Multiplanar CT image reconstructions of the cervical spine were also generated. COMPARISON:  Head CT December 28, 2008 and brain MRI May 16, 2015 FINDINGS: CT HEAD FINDINGS Brain: Mild diffuse atrophy is stable. There is no intracranial mass, hemorrhage, extra-axial fluid collection, or midline shift. There is slight small vessel disease in the centra semiovale bilaterally. Elsewhere gray-white compartments appear normal. No evident acute infarct. Vascular: No  hyperdense vessel. There is calcification in both carotid siphon regions. Skull: Bony calvarium appears intact. Sinuses/Orbits: There is mucosal thickening in several ethmoid air cells. Other visualized paranasal sinuses are clear. Visualized orbits appear symmetric bilaterally. Other: Mastoid air cells are clear. CT CERVICAL SPINE FINDINGS Alignment: There is no appreciable spondylolisthesis. Skull base and vertebrae: The skull base and craniocervical junction regions appear normal. There is mild pannus posterior to the odontoid, not causing appreciable impression on the craniocervical junction. There is no evident fracture. There are no blastic or lytic bone lesions. Soft tissues and spinal canal: Prevertebral soft tissues and predental space regions are normal. There are no paraspinous lesions. There is no evident cord or canal hematoma.  Disc levels: There is moderate disc space narrowing at C4-5, C5-6, and C7-T1. There is facet hypertrophy at several levels bilaterally. There is no frank disc extrusion or stenosis evident. Upper chest: Visualized upper lung zones are clear. Other: None IMPRESSION: CT head: Mild atrophy with mild periventricular small vessel disease. No acute infarct. No mass or hemorrhage. There are foci of arterial vascular calcification. There is mucosal thickening in several ethmoid air cells. CT cervical spine: No evident fracture or spondylolisthesis. There is osteoarthritic change at several levels. Electronically Signed   By: Bretta Bang III M.D.   On: 10/17/2017 18:01   Ct Cervical Spine Wo Contrast  Result Date: 10/17/2017 CLINICAL DATA:  Pain following fall EXAM: CT HEAD WITHOUT CONTRAST CT CERVICAL SPINE WITHOUT CONTRAST TECHNIQUE: Multidetector CT imaging of the head and cervical spine was performed following the standard protocol without intravenous contrast. Multiplanar CT image reconstructions of the cervical spine were also generated. COMPARISON:  Head CT December 28, 2008 and brain MRI May 16, 2015 FINDINGS: CT HEAD FINDINGS Brain: Mild diffuse atrophy is stable. There is no intracranial mass, hemorrhage, extra-axial fluid collection, or midline shift. There is slight small vessel disease in the centra semiovale bilaterally. Elsewhere gray-white compartments appear normal. No evident acute infarct. Vascular: No hyperdense vessel. There is calcification in both carotid siphon regions. Skull: Bony calvarium appears intact. Sinuses/Orbits: There is mucosal thickening in several ethmoid air cells. Other visualized paranasal sinuses are clear. Visualized orbits appear symmetric bilaterally. Other: Mastoid air cells are clear. CT CERVICAL SPINE FINDINGS Alignment: There is no appreciable spondylolisthesis. Skull base and vertebrae: The skull base and craniocervical junction regions appear normal. There is  mild pannus posterior to the odontoid, not causing appreciable impression on the craniocervical junction. There is no evident fracture. There are no blastic or lytic bone lesions. Soft tissues and spinal canal: Prevertebral soft tissues and predental space regions are normal. There are no paraspinous lesions. There is no evident cord or canal hematoma. Disc levels: There is moderate disc space narrowing at C4-5, C5-6, and C7-T1. There is facet hypertrophy at several levels bilaterally. There is no frank disc extrusion or stenosis evident. Upper chest: Visualized upper lung zones are clear. Other: None IMPRESSION: CT head: Mild atrophy with mild periventricular small vessel disease. No acute infarct. No mass or hemorrhage. There are foci of arterial vascular calcification. There is mucosal thickening in several ethmoid air cells. CT cervical spine: No evident fracture or spondylolisthesis. There is osteoarthritic change at several levels. Electronically Signed   By: Bretta Bang III M.D.   On: 10/17/2017 18:01   Dg Hip Unilat W Or Wo Pelvis 2-3 Views Right  Result Date: 10/17/2017 CLINICAL DATA:  Right hip pain after fall this morning EXAM: DG HIP (WITH OR WITHOUT PELVIS) 2-3V  RIGHT COMPARISON:  None. FINDINGS: There is no evidence of hip fracture or dislocation. Osteoarthritic spurring at the femoral head-neck junction with chondral calcifications noted. Osteoarthritis of the SI joints and pubic symphysis with sclerosis. Vascular phleboliths project over the pelvis. There is no evidence of arthropathy or other focal bone abnormality. IMPRESSION: Negative for acute fracture nor joint dislocation. Osteoarthritis of the SI joints, right hip and pubic symphysis. Electronically Signed   By: Tollie Eth M.D.   On: 10/17/2017 17:45    Procedures Procedures (including critical care time)  Medications Ordered in ED Medications - No data to display   Initial Impression / Assessment and Plan / ED Course  I  have reviewed the triage vital signs and the nursing notes.  Pertinent labs & imaging results that were available during my care of the patient were reviewed by me and considered in my medical decision making (see chart for details).   CT head and cervical spine negative lumbar spine and right hip show no dislocation no fractures  Patient with fall contusion to head.  Contusion of lumbar spine.  Patient will take Tylenol or her own medicines for pain and follow-up with PCP Final Clinical Impressions(s) / ED Diagnoses   Final diagnoses:  Fall, initial encounter    ED Discharge Orders    None       Bethann Berkshire, MD 10/17/17 1826

## 2017-11-26 ENCOUNTER — Other Ambulatory Visit: Payer: Self-pay

## 2017-11-26 ENCOUNTER — Emergency Department (HOSPITAL_COMMUNITY)
Admission: EM | Admit: 2017-11-26 | Discharge: 2017-11-26 | Disposition: A | Discharge status: Home / Self Care | Payer: Medicare Other | Attending: Emergency Medicine | Admitting: Emergency Medicine

## 2017-11-26 ENCOUNTER — Emergency Department (HOSPITAL_COMMUNITY): Payer: Medicare Other

## 2017-11-26 ENCOUNTER — Encounter (HOSPITAL_COMMUNITY): Payer: Self-pay

## 2017-11-26 DIAGNOSIS — E785 Hyperlipidemia, unspecified: Secondary | ICD-10-CM | POA: Insufficient documentation

## 2017-11-26 DIAGNOSIS — L03113 Cellulitis of right upper limb: Secondary | ICD-10-CM | POA: Insufficient documentation

## 2017-11-26 DIAGNOSIS — I2510 Atherosclerotic heart disease of native coronary artery without angina pectoris: Secondary | ICD-10-CM | POA: Diagnosis not present

## 2017-11-26 DIAGNOSIS — R2231 Localized swelling, mass and lump, right upper limb: Secondary | ICD-10-CM | POA: Diagnosis present

## 2017-11-26 DIAGNOSIS — R300 Dysuria: Secondary | ICD-10-CM | POA: Insufficient documentation

## 2017-11-26 DIAGNOSIS — I252 Old myocardial infarction: Secondary | ICD-10-CM | POA: Insufficient documentation

## 2017-11-26 DIAGNOSIS — E039 Hypothyroidism, unspecified: Secondary | ICD-10-CM | POA: Diagnosis not present

## 2017-11-26 DIAGNOSIS — I10 Essential (primary) hypertension: Secondary | ICD-10-CM | POA: Diagnosis not present

## 2017-11-26 DIAGNOSIS — E119 Type 2 diabetes mellitus without complications: Secondary | ICD-10-CM | POA: Insufficient documentation

## 2017-11-26 LAB — URINALYSIS, ROUTINE W REFLEX MICROSCOPIC
BILIRUBIN URINE: NEGATIVE
Glucose, UA: NEGATIVE mg/dL
Hgb urine dipstick: NEGATIVE
KETONES UR: NEGATIVE mg/dL
Leukocytes, UA: NEGATIVE
NITRITE: NEGATIVE
Protein, ur: NEGATIVE mg/dL
SPECIFIC GRAVITY, URINE: 1.016 (ref 1.005–1.030)
pH: 7 (ref 5.0–8.0)

## 2017-11-26 LAB — CBC WITH DIFFERENTIAL/PLATELET
BASOS ABS: 0 10*3/uL (ref 0.0–0.1)
BASOS PCT: 0 %
EOS ABS: 0.1 10*3/uL (ref 0.0–0.7)
Eosinophils Relative: 1 %
HCT: 34.5 % — ABNORMAL LOW (ref 36.0–46.0)
HEMOGLOBIN: 11.4 g/dL — AB (ref 12.0–15.0)
Lymphocytes Relative: 19 %
Lymphs Abs: 1.5 10*3/uL (ref 0.7–4.0)
MCH: 30.5 pg (ref 26.0–34.0)
MCHC: 33 g/dL (ref 30.0–36.0)
MCV: 92.2 fL (ref 78.0–100.0)
Monocytes Absolute: 0.6 10*3/uL (ref 0.1–1.0)
Monocytes Relative: 8 %
NEUTROS PCT: 72 %
Neutro Abs: 5.7 10*3/uL (ref 1.7–7.7)
Platelets: 285 10*3/uL (ref 150–400)
RBC: 3.74 MIL/uL — AB (ref 3.87–5.11)
RDW: 14.1 % (ref 11.5–15.5)
WBC: 7.8 10*3/uL (ref 4.0–10.5)

## 2017-11-26 LAB — I-STAT CG4 LACTIC ACID, ED: LACTIC ACID, VENOUS: 0.62 mmol/L (ref 0.5–1.9)

## 2017-11-26 LAB — BASIC METABOLIC PANEL
Anion gap: 11 (ref 5–15)
BUN: 16 mg/dL (ref 8–23)
CO2: 23 mmol/L (ref 22–32)
Calcium: 9 mg/dL (ref 8.9–10.3)
Chloride: 106 mmol/L (ref 98–111)
Creatinine, Ser: 0.57 mg/dL (ref 0.44–1.00)
GFR calc non Af Amer: >60 mL/min (ref >60)
Glucose, Bld: 122 mg/dL — ABNORMAL HIGH (ref 70–99)
POTASSIUM: 4.2 mmol/L (ref 3.5–5.1)
SODIUM: 140 mmol/L (ref 135–145)

## 2017-11-26 MED ORDER — SODIUM CHLORIDE 0.9 % IV SOLN
100.0000 mg | Freq: Once | INTRAVENOUS | Status: AC
  Administered 2017-11-26: 100 mg via INTRAVENOUS
  Filled 2017-11-26: qty 100

## 2017-11-26 MED ORDER — DOXYCYCLINE HYCLATE 100 MG PO CAPS: 100.0000 mg | ORAL_CAPSULE | Freq: Two times a day (BID) | ORAL | 0 refills | Status: DC

## 2017-11-26 NOTE — ED Provider Notes (Addendum)
[facility]-EMERGENCY DEPT Provider Note   CSN: [id number] Arrival date & time: 11/26/17  1707     History   Chief Complaint Chief Complaint  Patient presents with  . hand swelling    HPI Michelle [name] is a 84 y.o. female hx of GERD, HL, HTN, MI, here with right thumb pain and swelling.  She lives at home by herself but her son comes to visit her daily.  He noticed some swelling on the right thumb yesterday.  There is some associated redness as well.  Patient did not complain of any pain at that time and denies any trauma or injury or insect bites.  Came back today to visit patient and patient has worsening pain in the right hand and some redness of the right hand that is tracking up the arm.  Patient also was noted to have some dysuria as well and some confusion.   The history is provided by the patient.    Past Medical History:  Diagnosis Date  . Angina pectoris (HCC)    intrascapular, abnormal cardiolote preop TKR  . Arthritis   . ASCVD (arteriosclerotic cardiovascular disease)    S/P NSTEMI with LCX stent 2008, cath on 06/29/2008 revealed widely patent circumflex  . Bacterial infection 2016  . Coronary artery disease   . Diabetes mellitus   . GERD (gastroesophageal reflux disease)   . Heart attack (HCC) 2010  . Hyperlipidemia   . Hypertension   . Hypothyroidism   . Myocardial infarction [facility]) [id number]   with stents  . Rheumatoid arthritis (HCC) 2013  . Trigger finger 02/2013  . Vertigo 2016    Patient Active Problem List   Diagnosis Date Noted  . Osteoarthritis 06/26/2017  . Primary progressive aphasia 07/30/2015  . Mixed hyperlipidemia 05/04/2013  . Hypertension   . Coronary artery disease   . Hypothyroidism   . GERD (gastroesophageal reflux disease)   . Arthritis   . Myocardial infarction (HCC)   . Osteoarthritis of right knee 02/26/2012    Past Surgical History:  Procedure Laterality Date  . ABDOMINAL HYSTERECTOMY  1969  . BREAST  BIOPSY  2005   right -benign  . BREAST SURGERY  2004   right -cancerous-with radiation  . broken finger  2006  . CORONARY ANGIOPLASTY  2008, 2010  . foot ampuation  12/2009   right  . HAMMER TOE SURGERY  06/2009   right  . HERNIA REPAIR  [id number]  . KNEE ARTHROSCOPY  1980 ,1994   bilateral  . TOTAL GASTRECTOMY  1983   with vagotomy  . TOTAL KNEE ARTHROPLASTY  02/26/2012   Procedure: TOTAL KNEE ARTHROPLASTY;  Surgeon: Jacki Cones, MD;  Location: [facility];  Service: Orthopedics;  Laterality: Right;     OB History   None      Home Medications    Prior to Admission medications   Medication Sig Start Date End Date Taking? Authorizing Provider  traZODone (DESYREL) 50 MG tablet Take 50 mg by mouth daily as needed. 11/24/17  Yes [provider]  doxycycline (VIBRAMYCIN) 100 MG capsule Take 1 capsule (100 mg total) by mouth 2 (two) times daily. One po bid x 7 days 11/26/17   Charlynne Pander, MD  nitroGLYCERIN (NITROSTAT) 0.4 MG SL tablet Place 1 tablet (0.4 mg total) under the tongue every 5 (five) minutes as needed for chest pain. Patient not taking: Reported on 10/17/2017 01/11/16   Allayne Butcher, PA-C    Family History Family  History  Problem Relation Age of Onset  . Dementia Neg Hx     Social History Social History   Tobacco Use  . Smoking status: Never Smoker  . Smokeless tobacco: Never Used  Substance Use Topics  . Alcohol use: No  . Drug use: No     Allergies   Other   Review of Systems Review of Systems  Skin: Positive for color change.  All other systems reviewed and are negative.    Physical Exam Updated Vital Signs BP (!) 150/72 (BP Location: Left Arm)   Pulse 91   Temp 98.4 F (36.9 C) (Oral)   Resp 18   Ht 4\' 11"  (1.499 m)   Wt 63.5 kg (140 lb)   SpO2 98%   BMI 28.28 kg/m   Physical Exam  Constitutional: She is oriented to person, place, and time.  Demented   HENT:  Head: Normocephalic.  Mouth/Throat: Oropharynx is  clear and moist.  Eyes: Pupils are equal, round, and reactive to light. Conjunctivae and EOM are normal.  Neck: Normal range of motion. Neck supple.  Cardiovascular: Normal rate, regular rhythm and normal heart sounds.  Pulmonary/Chest: Effort normal and breath sounds normal. No stridor. No respiratory distress. She has no wheezes.  Abdominal: Soft. Bowel sounds are normal. She exhibits no distension. There is no tenderness. There is no guarding.  Musculoskeletal:  Swelling of the R thumb but nl capillary refill. Some surrounding erythema of the entire hand. Hand appears warm, some tracking up to R mid forearm. 2+ radial pulses   Neurological: She is alert and oriented to person, place, and time.  Skin: Skin is warm. There is erythema.  Psychiatric: She has a normal mood and affect.  Nursing note and vitals reviewed.      ED Treatments / Results  Labs (all labs ordered are listed, but only abnormal results are displayed) Labs Reviewed  CBC WITH DIFFERENTIAL/PLATELET - Abnormal; Notable for the following components:      Result Value   RBC 3.74 (*)    Hemoglobin 11.4 (*)    HCT 34.5 (*)    All other components within normal limits  BASIC METABOLIC PANEL - Abnormal; Notable for the following components:   Glucose, Bld 122 (*)    All other components within normal limits  URINE CULTURE  URINALYSIS, ROUTINE W REFLEX MICROSCOPIC  I-STAT CG4 LACTIC ACID, ED    EKG None  Radiology Dg Hand Complete Right  Result Date: 11/26/2017 CLINICAL DATA:  Right thumb swelling EXAM: RIGHT HAND - COMPLETE 3+ VIEW COMPARISON:  None. FINDINGS: Bones appear diffusely demineralized. No acute displaced fracture or malalignment. Marked arthritis at the first [facility] joint. Mild degenerative changes at the DIP joints. Prominent cartilaginous calcification at the wrist. Moderate arthritis at the distal radial carpal joint. Cyst in the scaphoid bone. IMPRESSION: 1. No acute osseous abnormality 2. Advanced  arthritis at the first [facility] joint. 3. Chondrocalcinosis Electronically Signed   By: Jasmine Pang M.D.   On: 11/26/2017 18:52    Procedures Procedures (including critical care time)  Medications Ordered in ED Medications  doxycycline (VIBRAMYCIN) 100 mg in sodium chloride 0.9 % 250 mL IVPB (100 mg Intravenous New Bag/Given 11/26/17 1811)     Initial Impression / Assessment and Plan / ED Course  I have reviewed the triage vital signs and the nursing notes.  Pertinent labs & imaging results that were available during my care of the patient were reviewed by me and considered in my medical  decision making (see chart for details).    HANIA CERONE is a 84 y.o. female here with R thumb redness and swelling of the hand. Also some dysuria. Afebrile, well appearing for age. Not tachycardic or hypotensive. Will get labs, lactate, xrays, UA. Will give doxycycline empirically for cellulitis and UTI.    8:08 PM Labs showed nl WBC. Xray showed no osteo. Has arthritis at first [facility] joint. Lactate nl. UA nl. Given doxycycline. Will dc home with same. Doesn't appear septic. Will dc home.   Final Clinical Impressions(s) / ED Diagnoses   Final diagnoses:  Cellulitis of right upper extremity    ED Discharge Orders        Ordered    doxycycline (VIBRAMYCIN) 100 MG capsule  2 times daily     11/26/17 2007       Charlynne Pander, MD 11/26/17 2006    Charlynne Pander, MD 11/26/17 2008

## 2017-11-26 NOTE — Discharge Instructions (Signed)
Take doxycycline twice daily for a week for hand infection   See your doctor  Return to ER if you have worse hand swelling, fever, severe pain, worse arm redness, confusion.

## 2017-11-26 NOTE — ED Triage Notes (Signed)
Patient's son reports that the patient had right thumb swelling and today when he went to visit his mother, he noticed that her whole hand was swollen.  Patient's son reports that the patient has a foul odor to urine. Patient unable to verbalize any other symptoms.

## 2017-11-28 LAB — URINE CULTURE: Culture: NO GROWTH

## 2017-11-30 ENCOUNTER — Emergency Department (HOSPITAL_COMMUNITY): Payer: Medicare Other

## 2017-11-30 ENCOUNTER — Emergency Department (HOSPITAL_COMMUNITY)
Admission: EM | Admit: 2017-11-30 | Discharge: 2017-11-30 | Disposition: A | Discharge status: Home / Self Care | Payer: Medicare Other | Attending: Emergency Medicine | Admitting: Emergency Medicine

## 2017-11-30 ENCOUNTER — Encounter (HOSPITAL_COMMUNITY): Payer: Self-pay | Admitting: Emergency Medicine

## 2017-11-30 DIAGNOSIS — Y92008 Other place in unspecified non-institutional (private) residence as the place of occurrence of the external cause: Secondary | ICD-10-CM | POA: Diagnosis not present

## 2017-11-30 DIAGNOSIS — S5001XA Contusion of right elbow, initial encounter: Secondary | ICD-10-CM | POA: Diagnosis not present

## 2017-11-30 DIAGNOSIS — E039 Hypothyroidism, unspecified: Secondary | ICD-10-CM | POA: Diagnosis not present

## 2017-11-30 DIAGNOSIS — I25119 Atherosclerotic heart disease of native coronary artery with unspecified angina pectoris: Secondary | ICD-10-CM | POA: Insufficient documentation

## 2017-11-30 DIAGNOSIS — E119 Type 2 diabetes mellitus without complications: Secondary | ICD-10-CM | POA: Diagnosis not present

## 2017-11-30 DIAGNOSIS — S0083XA Contusion of other part of head, initial encounter: Secondary | ICD-10-CM | POA: Diagnosis not present

## 2017-11-30 DIAGNOSIS — S40022A Contusion of left upper arm, initial encounter: Secondary | ICD-10-CM | POA: Diagnosis not present

## 2017-11-30 DIAGNOSIS — W010XXA Fall on same level from slipping, tripping and stumbling without subsequent striking against object, initial encounter: Secondary | ICD-10-CM | POA: Insufficient documentation

## 2017-11-30 DIAGNOSIS — Y9389 Activity, other specified: Secondary | ICD-10-CM | POA: Insufficient documentation

## 2017-11-30 DIAGNOSIS — Z96653 Presence of artificial knee joint, bilateral: Secondary | ICD-10-CM | POA: Diagnosis not present

## 2017-11-30 DIAGNOSIS — S0990XA Unspecified injury of head, initial encounter: Secondary | ICD-10-CM | POA: Diagnosis not present

## 2017-11-30 DIAGNOSIS — I119 Hypertensive heart disease without heart failure: Secondary | ICD-10-CM | POA: Diagnosis not present

## 2017-11-30 DIAGNOSIS — F0390 Unspecified dementia without behavioral disturbance: Secondary | ICD-10-CM | POA: Diagnosis not present

## 2017-11-30 DIAGNOSIS — R442 Other hallucinations: Secondary | ICD-10-CM | POA: Diagnosis present

## 2017-11-30 DIAGNOSIS — Y999 Unspecified external cause status: Secondary | ICD-10-CM | POA: Insufficient documentation

## 2017-11-30 LAB — CBC WITH DIFFERENTIAL/PLATELET
BASOS PCT: 0 %
Basophils Absolute: 0 10*3/uL (ref 0.0–0.1)
EOS ABS: 0 10*3/uL (ref 0.0–0.7)
EOS PCT: 0 %
HCT: 35 % — ABNORMAL LOW (ref 36.0–46.0)
HEMOGLOBIN: 11.3 g/dL — AB (ref 12.0–15.0)
Lymphocytes Relative: 19 %
Lymphs Abs: 1.5 10*3/uL (ref 0.7–4.0)
MCH: 29.7 pg (ref 26.0–34.0)
MCHC: 32.3 g/dL (ref 30.0–36.0)
MCV: 92.1 fL (ref 78.0–100.0)
Monocytes Absolute: 0.6 10*3/uL (ref 0.1–1.0)
Monocytes Relative: 7 %
NEUTROS PCT: 74 %
Neutro Abs: 5.8 10*3/uL (ref 1.7–7.7)
PLATELETS: 299 10*3/uL (ref 150–400)
RBC: 3.8 MIL/uL — ABNORMAL LOW (ref 3.87–5.11)
RDW: 13.8 % (ref 11.5–15.5)
WBC: 7.8 10*3/uL (ref 4.0–10.5)

## 2017-11-30 LAB — BASIC METABOLIC PANEL
Anion gap: 7 (ref 5–15)
BUN: 20 mg/dL (ref 8–23)
CHLORIDE: 110 mmol/L (ref 98–111)
CO2: 27 mmol/L (ref 22–32)
Calcium: 8.9 mg/dL (ref 8.9–10.3)
Creatinine, Ser: 0.57 mg/dL (ref 0.44–1.00)
GLUCOSE: 75 mg/dL (ref 70–99)
Potassium: 3.9 mmol/L (ref 3.5–5.1)
SODIUM: 144 mmol/L (ref 135–145)

## 2017-11-30 LAB — URINALYSIS, ROUTINE W REFLEX MICROSCOPIC
BILIRUBIN URINE: NEGATIVE
Glucose, UA: NEGATIVE mg/dL
HGB URINE DIPSTICK: NEGATIVE
KETONES UR: 5 mg/dL — AB
Leukocytes, UA: NEGATIVE
NITRITE: NEGATIVE
PH: 5 (ref 5.0–8.0)
Protein, ur: NEGATIVE mg/dL
Specific Gravity, Urine: 1.028 (ref 1.005–1.030)

## 2017-11-30 NOTE — Discharge Instructions (Addendum)
Tylenol for pain as needed. Ice right elbow several times a day. Follow up with family doctor as needed. Return if worsening symptoms.

## 2017-11-30 NOTE — ED Triage Notes (Signed)
Family reports that this morning when got to patient's house to check on her she was on the floor.  EMS came out and pt was able to stand and walk. Since then patient has bruise come up on her chin, closing her eyes and waving hands around.  Pt has front lobe dementia and aphasia, so hard to get information out of her.  Family denies her being on blood thinners.

## 2017-11-30 NOTE — ED Provider Notes (Signed)
[facility] DEPT Provider Note   CSN: [id number] Arrival date & time: 11/30/17  1148     History   Chief Complaint Chief Complaint  Patient presents with  . Fall  . Altered Mental Status    HPI Michelle [name] is a 84 y.o. female.  HPI Michelle [name] is a 84 y.o. female with history of dementia, hypertension, coronary disease, diabetes, presents to emergency department from home with HER-2 sons with complaint of a fall.  Patient apparently had an unwitnessed fall earlier this morning.  Found by son on the floor.  Patient seems to be carrying something and tripped over a step in her living room.  EMS was called and they were able to get her up and move her around a walker.  Patient did not seem to have any injuries.  Shortly after, patient started hallucinating and catching things in the area they were not there.  This was unusual for her.  Son states that she has never done that or hallucinating in the past.  Patient brought here for evaluation.  Patient does have a bruise on her chin and right elbow and shoulder from the fall.  She is not anticoagulated.  Patient is demented and unable to provide any history.  Past Medical History:  Diagnosis Date  . Angina pectoris (HCC)    intrascapular, abnormal cardiolote preop TKR  . Arthritis   . ASCVD (arteriosclerotic cardiovascular disease)    S/P NSTEMI with LCX stent 2008, cath on 06/29/2008 revealed widely patent circumflex  . Bacterial infection 2016  . Coronary artery disease   . Diabetes mellitus   . GERD (gastroesophageal reflux disease)   . Heart attack ([place]) 2010  . Hyperlipidemia   . Hypertension   . Hypothyroidism   . Myocardial infarction [facility] [id number]   with stents  . Rheumatoid arthritis ([place]) 2013  . Trigger finger 02/2013  . Vertigo 2016    Patient Active Problem List   Diagnosis Date Noted  . Osteoarthritis 06/26/2017  . Primary progressive aphasia 07/30/2015  . Mixed hyperlipidemia  05/04/2013  . Hypertension   . Coronary artery disease   . Hypothyroidism   . GERD (gastroesophageal reflux disease)   . Arthritis   . Myocardial infarction ([place])   . Osteoarthritis of right knee 02/26/2012    Past Surgical History:  Procedure Laterality Date  . ABDOMINAL HYSTERECTOMY  1969  . BREAST BIOPSY  2005   right -benign  . BREAST SURGERY  2004   right -cancerous-with radiation  . broken finger  2006  . CORONARY ANGIOPLASTY  2008, 2010  . foot ampuation  12/2009   right  . HAMMER TOE SURGERY  06/2009   right  . HERNIA REPAIR  [id number]  . KNEE ARTHROSCOPY  1980 ,1994   bilateral  . TOTAL GASTRECTOMY  1983   with vagotomy  . TOTAL KNEE ARTHROPLASTY  02/26/2012   Procedure: TOTAL KNEE ARTHROPLASTY;  Surgeon: Tobi Bastos, MD;  Location: [facility] ORS;  Service: Orthopedics;  Laterality: Right;     OB History   None      Home Medications    Prior to Admission medications   Medication Sig Start Date End Date Taking? Authorizing Provider  doxycycline (VIBRAMYCIN) 100 MG capsule Take 1 capsule (100 mg total) by mouth 2 (two) times daily. One po bid x 7 days 11/26/17   Drenda Freeze, MD  nitroGLYCERIN (NITROSTAT) 0.4 MG SL tablet Place 1 tablet (0.4 mg total) under  the tongue every 5 (five) minutes as needed for chest pain. Patient not taking: Reported on 10/17/2017 01/11/16   Lyda Jester M, PA-C  traZODone (DESYREL) 50 MG tablet Take 50 mg by mouth daily as needed. 11/24/17   [provider]    Family History Family History  Problem Relation Age of Onset  . Dementia Neg Hx     Social History Social History   Tobacco Use  . Smoking status: Never Smoker  . Smokeless tobacco: Never Used  Substance Use Topics  . Alcohol use: No  . Drug use: No     Allergies   Other   Review of Systems Review of Systems  Unable to perform ROS: Dementia     Physical Exam Updated Vital Signs BP (!) 100/91 (BP Location: Left Arm)   Pulse 67   Temp  98 F (36.7 C) (Oral)   Resp 18   SpO2 96%   Physical Exam  Constitutional: She appears well-developed and well-nourished. No distress.  HENT:  Head: Normocephalic.  Contusion to the chin  Eyes: Pupils are equal, round, and reactive to light. Conjunctivae and EOM are normal.  Neck: Neck supple.  Cardiovascular: Normal rate, regular rhythm and normal heart sounds.  Pulmonary/Chest: Effort normal and breath sounds normal. No respiratory distress. She has no wheezes. She has no rales.  Abdominal: Soft. Bowel sounds are normal. She exhibits no distension. There is no tenderness. There is no rebound.  Musculoskeletal: She exhibits no edema.  Bruising and superficial abrasion to the right elbow, tender to palpation.  Full range of motion of the elbow.  Bruising to the upper right arm.  Full range of motion of the shoulder.  Neurological: She is alert.  5/5 and equal upper and lower extremity strength bilaterally. Equal grip strength bilaterally. No pronator drift.    Skin: Skin is warm and dry.  Psychiatric: She has a normal mood and affect. Her behavior is normal.  Nursing note and vitals reviewed.    ED Treatments / Results  Labs (all labs ordered are listed, but only abnormal results are displayed) Labs Reviewed  URINALYSIS, ROUTINE W REFLEX MICROSCOPIC - Abnormal; Notable for the following components:      Result Value   Ketones, ur 5 (*)    All other components within normal limits  CBC WITH DIFFERENTIAL/PLATELET - Abnormal; Notable for the following components:   RBC 3.80 (*)    Hemoglobin 11.3 (*)    HCT 35.0 (*)    All other components within normal limits  BASIC METABOLIC PANEL    EKG EKG Interpretation  Date/Time:  Sunday November 30 2017 14:23:50 EDT Ventricular Rate:  55 PR Interval:    QRS Duration: 82 QT Interval:  516 QTC Calculation: 494 R Axis:   75 Text Interpretation:  Sinus rhythm Probable LVH with secondary repol abnrm Minimal ST elevation, lateral leads  Borderline prolonged QT interval No significant change since last tracing Confirmed by Isla Pence [phone]) on 11/30/2017 2:27:24 PM   Radiology Dg Shoulder Right  Result Date: 11/30/2017 CLINICAL DATA:  Found down, now with right shoulder pain. EXAM: RIGHT SHOULDER - 2+ VIEW COMPARISON:  None. FINDINGS: No fracture or dislocation. Glenohumeral joint spaces appear grossly preserved given obliquity. Mild-to-moderate degenerative change the right AC joint with joint space loss and both superior and inferiorly directed osteophytosis. Linear calcifications are seen at the expected location of the rotator cuff insertion site. Regional soft tissues appear normal. Limited visualization of the adjacent thorax is normal.  IMPRESSION: 1. No definite acute findings. 2. Moderate degenerative change the right AC joint. 3. Linear calcifications overlie the expected location of the rotator cuff insertion site as could be seen in the setting of calcific tendinitis. Electronically Signed   By: Sandi Mariscal M.D.   On: 11/30/2017 14:16   Dg Elbow Complete Right  Result Date: 11/30/2017 CLINICAL DATA:  Found down, now with right elbow pain. EXAM: RIGHT ELBOW - COMPLETE 3+ VIEW COMPARISON:  Right shoulder radiographs-earlier same day FINDINGS: Osteopenia. No definite displaced fracture or elbow joint effusion. Tiny ossicle is noted adjacent to the medial epicondyle, likely the sequela of remote avulsive injury. Joint spaces appear preserved. Regional soft tissues appear normal. No radiopaque foreign body. IMPRESSION: Osteopenia without definitive acute fracture or elbow joint effusion. Electronically Signed   By: Sandi Mariscal M.D.   On: 11/30/2017 14:15   Ct Head Wo Contrast  Result Date: 11/30/2017 CLINICAL DATA:  Found down, altered mental status. Possible head trauma. History of frontal lobe dementia and aphasia. EXAM: CT HEAD WITHOUT CONTRAST CT CERVICAL SPINE WITHOUT CONTRAST TECHNIQUE: Multidetector CT imaging of the head  and cervical spine was performed following the standard protocol without intravenous contrast. Multiplanar CT image reconstructions of the cervical spine were also generated. COMPARISON:  Head CT and cervical spine CT dated 10/17/2017. FINDINGS: CT HEAD FINDINGS Brain: There is mild generalized age related volume loss with commensurate dilatation of the ventricles and sulci. Ventricles are stable in size and configuration. Mild chronic small vessel ischemic changes again noted within the deep periventricular white matter regions bilaterally. There is no mass, hemorrhage, edema or other evidence of acute parenchymal abnormality. No extra-axial hemorrhage. Vascular: There are chronic calcified atherosclerotic changes of the large vessels at the skull base. No unexpected hyperdense vessel. Skull: Normal. Negative for fracture or focal lesion. Sinuses/Orbits: No acute finding. Other: None. CT CERVICAL SPINE FINDINGS Alignment: Mild levoscoliosis of the lower cervical spine. Straightening of the normal cervical spine lordosis is likely related to patient positioning or muscle spasm. No evidence of acute vertebral body subluxation. Skull base and vertebrae: No fracture line or displaced fracture fragment seen. Facet joints appear intact and normally aligned throughout. Soft tissues and spinal canal: No prevertebral fluid or swelling. No visible canal hematoma. Disc levels: Mild degenerative spondylosis throughout the cervical spine, but no more than mild central canal stenosis at any level. Upper chest: Negative. Other: None. IMPRESSION: 1. No acute intracranial abnormality. No intracranial mass, hemorrhage or edema. No skull fracture. Mild chronic small vessel ischemic change within the white matter. 2. No fracture or acute subluxation within the cervical spine. Mild degenerative spondylosis. Electronically Signed   By: Franki Cabot M.D.   On: 11/30/2017 13:53   Ct Cervical Spine Wo Contrast  Result Date:  11/30/2017 CLINICAL DATA:  Found down, altered mental status. Possible head trauma. History of frontal lobe dementia and aphasia. EXAM: CT HEAD WITHOUT CONTRAST CT CERVICAL SPINE WITHOUT CONTRAST TECHNIQUE: Multidetector CT imaging of the head and cervical spine was performed following the standard protocol without intravenous contrast. Multiplanar CT image reconstructions of the cervical spine were also generated. COMPARISON:  Head CT and cervical spine CT dated 10/17/2017. FINDINGS: CT HEAD FINDINGS Brain: There is mild generalized age related volume loss with commensurate dilatation of the ventricles and sulci. Ventricles are stable in size and configuration. Mild chronic small vessel ischemic changes again noted within the deep periventricular white matter regions bilaterally. There is no mass, hemorrhage, edema or other evidence of  acute parenchymal abnormality. No extra-axial hemorrhage. Vascular: There are chronic calcified atherosclerotic changes of the large vessels at the skull base. No unexpected hyperdense vessel. Skull: Normal. Negative for fracture or focal lesion. Sinuses/Orbits: No acute finding. Other: None. CT CERVICAL SPINE FINDINGS Alignment: Mild levoscoliosis of the lower cervical spine. Straightening of the normal cervical spine lordosis is likely related to patient positioning or muscle spasm. No evidence of acute vertebral body subluxation. Skull base and vertebrae: No fracture line or displaced fracture fragment seen. Facet joints appear intact and normally aligned throughout. Soft tissues and spinal canal: No prevertebral fluid or swelling. No visible canal hematoma. Disc levels: Mild degenerative spondylosis throughout the cervical spine, but no more than mild central canal stenosis at any level. Upper chest: Negative. Other: None. IMPRESSION: 1. No acute intracranial abnormality. No intracranial mass, hemorrhage or edema. No skull fracture. Mild chronic small vessel ischemic change within  the white matter. 2. No fracture or acute subluxation within the cervical spine. Mild degenerative spondylosis. Electronically Signed   By: Franki Cabot M.D.   On: 11/30/2017 13:53    Procedures Procedures (including critical care time)  Medications Ordered in ED Medications - No data to display   Initial Impression / Assessment and Plan / ED Course  I have reviewed the triage vital signs and the nursing notes.  Pertinent labs & imaging results that were available during my care of the patient were reviewed by me and considered in my medical decision making (see chart for details).     Patient after unwitnessed fall, but fell near a step, patient has tripped over the step in the past, most likely mechanical fall, however unable to rule out any other causes for her fall.  Will get EKG basic labs, urinalysis.  Patient also had an episode of hallucination after the fall.  She is currently close to her baseline.  Will get CT head and cervical spine.  3:34 PM CTs negative. Blood work with no significant abnormalities. Urine negative. Pt back to baseline according to sons. Question mild disorientation from head injury. Discussed with Dr. Gilford Raid, ok to dc home. Will have pt follow up with pcp. Return precautions discussed.   Vitals:   11/30/17 1157 11/30/17 1423 11/30/17 1424 11/30/17 1430  BP: (!) 100/91  106/61 99/70  Pulse: 67 (!) 53 (!) 53 (!) 57  Resp: _0 Temp: 98 F (36.7 C)     TempSrc: Oral     SpO2: 96% 96% 96% 99%     Final Clinical Impressions(s) / ED Diagnoses   Final diagnoses:  Injury of head, initial encounter  Contusion of right elbow, initial encounter  Contusion of multiple sites of left upper arm, initial encounter    ED Discharge Orders    None       Jeannett Senior, PA-C 11/30/17 1541    Isla Pence, MD 12/01/17 [phone]

## 2017-12-25 ENCOUNTER — Other Ambulatory Visit: Payer: Medicare Other

## 2018-05-31 ENCOUNTER — Emergency Department (HOSPITAL_COMMUNITY): Payer: Medicare Other

## 2018-05-31 ENCOUNTER — Observation Stay (HOSPITAL_COMMUNITY)
Admission: EM | Admit: 2018-05-31 | Discharge: 2018-06-01 | Disposition: A | Discharge status: Home / Self Care | Payer: Medicare Other | Attending: Internal Medicine | Admitting: Internal Medicine

## 2018-05-31 ENCOUNTER — Encounter (HOSPITAL_COMMUNITY): Payer: Self-pay | Admitting: Emergency Medicine

## 2018-05-31 ENCOUNTER — Other Ambulatory Visit: Payer: Self-pay

## 2018-05-31 DIAGNOSIS — R778 Other specified abnormalities of plasma proteins: Secondary | ICD-10-CM

## 2018-05-31 DIAGNOSIS — X31XXXA Exposure to excessive natural cold, initial encounter: Secondary | ICD-10-CM | POA: Insufficient documentation

## 2018-05-31 DIAGNOSIS — E039 Hypothyroidism, unspecified: Secondary | ICD-10-CM | POA: Diagnosis not present

## 2018-05-31 DIAGNOSIS — E119 Type 2 diabetes mellitus without complications: Secondary | ICD-10-CM | POA: Diagnosis not present

## 2018-05-31 DIAGNOSIS — W19XXXA Unspecified fall, initial encounter: Secondary | ICD-10-CM

## 2018-05-31 DIAGNOSIS — T68XXXA Hypothermia, initial encounter: Secondary | ICD-10-CM | POA: Diagnosis present

## 2018-05-31 DIAGNOSIS — Z79899 Other long term (current) drug therapy: Secondary | ICD-10-CM | POA: Insufficient documentation

## 2018-05-31 DIAGNOSIS — F0390 Unspecified dementia without behavioral disturbance: Secondary | ICD-10-CM | POA: Diagnosis present

## 2018-05-31 DIAGNOSIS — I2510 Atherosclerotic heart disease of native coronary artery without angina pectoris: Secondary | ICD-10-CM | POA: Diagnosis present

## 2018-05-31 DIAGNOSIS — I10 Essential (primary) hypertension: Secondary | ICD-10-CM | POA: Insufficient documentation

## 2018-05-31 DIAGNOSIS — R05 Cough: Secondary | ICD-10-CM | POA: Diagnosis not present

## 2018-05-31 DIAGNOSIS — Z96651 Presence of right artificial knee joint: Secondary | ICD-10-CM | POA: Insufficient documentation

## 2018-05-31 DIAGNOSIS — R7989 Other specified abnormal findings of blood chemistry: Secondary | ICD-10-CM | POA: Diagnosis present

## 2018-05-31 DIAGNOSIS — Z955 Presence of coronary angioplasty implant and graft: Secondary | ICD-10-CM | POA: Insufficient documentation

## 2018-05-31 DIAGNOSIS — G3101 Pick's disease: Secondary | ICD-10-CM

## 2018-05-31 DIAGNOSIS — R0902 Hypoxemia: Secondary | ICD-10-CM

## 2018-05-31 DIAGNOSIS — T68XXXS Hypothermia, sequela: Secondary | ICD-10-CM | POA: Diagnosis not present

## 2018-05-31 DIAGNOSIS — F0280 Dementia in other diseases classified elsewhere without behavioral disturbance: Secondary | ICD-10-CM | POA: Diagnosis present

## 2018-05-31 LAB — CBC WITH DIFFERENTIAL/PLATELET
ABS IMMATURE GRANULOCYTES: 0.04 10*3/uL (ref 0.00–0.07)
Basophils Absolute: 0 10*3/uL (ref 0.0–0.1)
Basophils Relative: 0 %
Eosinophils Absolute: 0 10*3/uL (ref 0.0–0.5)
Eosinophils Relative: 0 %
HCT: 36.9 % (ref 36.0–46.0)
Hemoglobin: 11.6 g/dL — ABNORMAL LOW (ref 12.0–15.0)
IMMATURE GRANULOCYTES: 0 %
Lymphocytes Relative: 6 %
Lymphs Abs: 0.8 10*3/uL (ref 0.7–4.0)
MCH: 29.4 pg (ref 26.0–34.0)
MCHC: 31.4 g/dL (ref 30.0–36.0)
MCV: 93.4 fL (ref 80.0–100.0)
Monocytes Absolute: 0.4 10*3/uL (ref 0.1–1.0)
Monocytes Relative: 3 %
NEUTROS ABS: 10.9 10*3/uL — AB (ref 1.7–7.7)
NEUTROS PCT: 91 %
NRBC: 0 % (ref 0.0–0.2)
Platelets: 248 10*3/uL (ref 150–400)
RBC: 3.95 MIL/uL (ref 3.87–5.11)
RDW: 14.5 % (ref 11.5–15.5)
WBC: 12.1 10*3/uL — ABNORMAL HIGH (ref 4.0–10.5)

## 2018-05-31 LAB — COMPREHENSIVE METABOLIC PANEL
ALT: 15 U/L (ref 0–44)
ANION GAP: 8 (ref 5–15)
AST: 23 U/L (ref 15–41)
Albumin: 3.2 g/dL — ABNORMAL LOW (ref 3.5–5.0)
Alkaline Phosphatase: 84 U/L (ref 38–126)
BUN: 14 mg/dL (ref 8–23)
CO2: 23 mmol/L (ref 22–32)
Calcium: 8.4 mg/dL — ABNORMAL LOW (ref 8.9–10.3)
Chloride: 107 mmol/L (ref 98–111)
Creatinine, Ser: 0.66 mg/dL (ref 0.44–1.00)
GFR calc Af Amer: >60 mL/min (ref >60)
GFR calc non Af Amer: >60 mL/min (ref >60)
Glucose, Bld: 123 mg/dL — ABNORMAL HIGH (ref 70–99)
POTASSIUM: 3.8 mmol/L (ref 3.5–5.1)
Sodium: 138 mmol/L (ref 135–145)
Total Bilirubin: 0.6 mg/dL (ref 0.3–1.2)
Total Protein: 5.9 g/dL — ABNORMAL LOW (ref 6.5–8.1)

## 2018-05-31 LAB — I-STAT TROPONIN, ED: Troponin i, poc: 0.44 ng/mL (ref 0.00–0.08)

## 2018-05-31 LAB — AMMONIA: AMMONIA: 13 umol/L (ref 9–35)

## 2018-05-31 LAB — MAGNESIUM: Magnesium: 1.7 mg/dL (ref 1.7–2.4)

## 2018-05-31 LAB — TROPONIN I
Troponin I: 0.98 ng/mL (ref <0.03)
Troponin I: 0.79 ng/mL (ref <0.03)

## 2018-05-31 LAB — TSH: TSH: 2.959 u[IU]/mL (ref 0.350–4.500)

## 2018-05-31 LAB — I-STAT CG4 LACTIC ACID, ED: Lactic Acid, Venous: 1.42 mmol/L (ref 0.5–1.9)

## 2018-05-31 LAB — BRAIN NATRIURETIC PEPTIDE: B Natriuretic Peptide: 155.7 pg/mL — ABNORMAL HIGH (ref 0.0–100.0)

## 2018-05-31 LAB — LIPASE, BLOOD: Lipase: 14 U/L (ref 11–51)

## 2018-05-31 LAB — CK: CK TOTAL: 172 U/L (ref 38–234)

## 2018-05-31 MED ORDER — SODIUM CHLORIDE 0.9 % IV SOLN: 250.0000 mL | INTRAVENOUS | Status: DC | PRN

## 2018-05-31 MED ORDER — SODIUM CHLORIDE 0.9% FLUSH: 3.0000 mL | INTRAVENOUS | Status: DC | PRN

## 2018-05-31 MED ORDER — BUSPIRONE HCL 5 MG PO TABS
10.0000 mg | ORAL_TABLET | Freq: Three times a day (TID) | ORAL | Status: DC
  Administered 2018-05-31 – 2018-06-01 (×3): 10 mg via ORAL
  Filled 2018-05-31: qty 2
  Filled 2018-05-31 (×2): qty 1

## 2018-05-31 MED ORDER — SODIUM CHLORIDE 0.9% FLUSH
3.0000 mL | Freq: Two times a day (BID) | INTRAVENOUS | Status: DC
  Administered 2018-05-31 – 2018-06-01 (×2): 3 mL via INTRAVENOUS

## 2018-05-31 MED ORDER — LORAZEPAM 2 MG/ML IJ SOLN
2.0000 mg | INTRAMUSCULAR | Status: DC | PRN
  Administered 2018-05-31: 2 mg via INTRAVENOUS
  Filled 2018-05-31: qty 1

## 2018-05-31 MED ORDER — SERTRALINE HCL 50 MG PO TABS
50.0000 mg | ORAL_TABLET | Freq: Every day | ORAL | Status: DC
  Administered 2018-05-31 – 2018-06-01 (×2): 50 mg via ORAL
  Filled 2018-05-31 (×3): qty 1

## 2018-05-31 NOTE — ED Notes (Signed)
Placed pt on bedpan, tolerated well. Pt had large BM and large void. Was unable to use for urine sample due to the BM.

## 2018-05-31 NOTE — ED Notes (Signed)
Admitting doctor messaged about troponin of 0.98

## 2018-05-31 NOTE — ED Triage Notes (Signed)
To ED via [facility] from [facility] - was found outside, unsure of time frame- staff states that she was last seen in room at approx 0545- found by 1st shift staff.  Bruising noted to right knee, cool to touch, rectal temp = 95.8 on arrival == bair hugger placed.   Pt is at baseline mentation per nursing home staff.

## 2018-05-31 NOTE — ED Notes (Signed)
Pt agreeable to get into recliner and stay there.  Pt still repeating phrases but not wandering around trying to leave. Pt willing to take medications now.

## 2018-05-31 NOTE — ED Notes (Addendum)
Date and time results received: 05/31/18 1:32 PM   Test: trop Critical Value: 0.98 Trop  Name of Provider Notified: [place]

## 2018-05-31 NOTE — ED Notes (Signed)
Bair hugger placed on pt due to rectal temp. 95.8

## 2018-05-31 NOTE — ED Notes (Signed)
Attempted to feed pt grilled chicken, pt took one bite of chicken and about 4 bites of vanilla pudding

## 2018-05-31 NOTE — ED Notes (Signed)
Pt yelling they are trying to kill me.  When this RN asked who they are pt stated "you know who".  RN attempted redirect patient to recliner.  Pt unwilling to take any medications or get back into bed.  Pt unable to communicate needs, keeps repeating phrases and wanting to get out.

## 2018-05-31 NOTE — H&P (Signed)
History and Physical    Michelle FaesJoan T [name] VWU:[id number] DOB: 1928/04/30 DOA: 05/31/2018  PCP: Ralene OkMoreira, Roy, MD  Patient coming from: Memory care assisted living unit  Chief Complaint: Found down  HPI: Michelle [name] is a 85 y.o. female with medical history significant of dementia, coronary artery disease, progressive aphasia has been in memory care unit since August 2019 was found this morning locked outside in their garden area for an unknown period of time with dirt all over her.  It is a locked unit however is unknown if the garden area which is outside is locked or not.  She was last seen per our records by their staff at 545 this morning.  She was brought in at 8:09 AM this morning.  So it seems she was at least locked outside for an hour or so.  Patient was found to be hardly responsive and very cold brought to the emergency department.  She of course does not recall the events this morning.  She has 2 sons that are present with her right now they last saw her yesterday.  They report she has been in her normal state of health until this event this morning.  She has not had any fevers no cough no shortness of breath no nausea vomiting or diarrhea.  Patient found to have a temperature of 95 was placed on a bear hugger and as she was warmed up her mental status improved back to her baseline.  She has been been bradycardic at first which is seem to normalized.  Patient is being referred for admission for hypothermia.  Review of Systems: As per HPI otherwise 10 point review of systems negative per 2 sons were present.  Past Medical History:  Diagnosis Date  . Angina pectoris (HCC)    intrascapular, abnormal cardiolote preop TKR  . Arthritis   . ASCVD (arteriosclerotic cardiovascular disease)    S/P NSTEMI with LCX stent 2008, cath on 06/29/2008 revealed widely patent circumflex  . Bacterial infection 2016  . Coronary artery disease   . Diabetes mellitus   . GERD (gastroesophageal reflux disease)   .  Heart attack (HCC) 2010  . Hyperlipidemia   . Hypertension   . Hypothyroidism   . Myocardial infarction [facility](HCC) [id number]   with stents  . Rheumatoid arthritis (HCC) 2013  . Trigger finger 02/2013  . Vertigo 2016    Past Surgical History:  Procedure Laterality Date  . ABDOMINAL HYSTERECTOMY  1969  . BREAST BIOPSY  2005   right -benign  . BREAST SURGERY  2004   right -cancerous-with radiation  . broken finger  2006  . CORONARY ANGIOPLASTY  2008, 2010  . foot ampuation  12/2009   right  . HAMMER TOE SURGERY  06/2009   right  . HERNIA REPAIR  [id number]  . KNEE ARTHROSCOPY  1980 ,1994   bilateral  . TOTAL GASTRECTOMY  1983   with vagotomy  . TOTAL KNEE ARTHROPLASTY  02/26/2012   Procedure: TOTAL KNEE ARTHROPLASTY;  Surgeon: Jacki Conesonald A Gioffre, MD;  Location: [facility];  Service: Orthopedics;  Laterality: Right;     reports that she has never smoked. She has never used smokeless tobacco. She reports that she does not drink alcohol or use drugs.  No Known Allergies  Family History  Problem Relation Age of Onset  . Dementia Neg Hx     Prior to Admission medications   Medication Sig Start Date End Date Taking? Authorizing Provider  acetaminophen (TYLENOL) 325 MG tablet  Take 650 mg by mouth every 6 (six) hours as needed for moderate pain.   Yes [provider]  busPIRone (BUSPAR) 10 MG tablet Take 10 mg by mouth 3 (three) times daily.   Yes [provider]  sertraline (ZOLOFT) 50 MG tablet Take 50 mg by mouth daily.   Yes [provider]  doxycycline (VIBRAMYCIN) 100 MG capsule Take 1 capsule (100 mg total) by mouth 2 (two) times daily. One po bid x 7 days Patient not taking: Reported on 05/31/2018 11/26/17   Charlynne Pander, MD  nitroGLYCERIN (NITROSTAT) 0.4 MG SL tablet Place 1 tablet (0.4 mg total) under the tongue every 5 (five) minutes as needed for chest pain. Patient not taking: Reported on 10/17/2017 01/11/16   Allayne Butcher, PA-C    Physical  Exam: Vitals:   05/31/18 0806 05/31/18 0812 05/31/18 1130 05/31/18 1148  BP: (!) 115/59  102/66 (!) 98/53  Pulse: 64  (!) 55 (!) 59  Resp: 12  20 15   Temp: (!) 95.8 F (35.4 C)   98 F (36.7 C)  TempSrc: Rectal   Oral  SpO2: 95%  98% 100%  Weight:  63.5 kg    Height:  5\' 4"  (1.626 m)        Constitutional: NAD, calm, comfortable pleasantly demented Vitals:   05/31/18 0806 05/31/18 0812 05/31/18 1130 05/31/18 1148  BP: (!) 115/59  102/66 (!) 98/53  Pulse: 64  (!) 55 (!) 59  Resp: 12  20 15   Temp: (!) 95.8 F (35.4 C)   98 F (36.7 C)  TempSrc: Rectal   Oral  SpO2: 95%  98% 100%  Weight:  63.5 kg    Height:  5\' 4"  (1.626 m)     Eyes: PERRL, lids and conjunctivae normal ENMT: Mucous membranes are moist. Posterior pharynx clear of any exudate or lesions.Normal dentition.  Neck: normal, supple, no masses, no thyromegaly Respiratory: clear to auscultation bilaterally, no wheezing, no crackles. Normal respiratory effort. No accessory muscle use.  Cardiovascular: Regular rate and rhythm, no murmurs / rubs / gallops. No extremity edema. 2+ pedal pulses. No carotid bruits.  Abdomen: no tenderness, no masses palpated. No hepatosplenomegaly. Bowel sounds positive.  Musculoskeletal: no clubbing / cyanosis. No joint deformity upper and lower extremities. Good ROM, no contractures. Normal muscle tone.  Skin: no rashes, lesions, ulcers. No induration Neurologic: CN 2-12 grossly intact. Sensation intact, DTR normal. Strength 5/5 in all 4.  Psychiatric: Not normal judgment and insight. Alert and oriented x 0. Normal mood.    Labs on Admission: I have personally reviewed following labs and imaging studies  CBC: Recent Labs  Lab 05/31/18 0837  WBC 12.1*  NEUTROABS 10.9*  HGB 11.6*  HCT 36.9  MCV 93.4  PLT 248   Basic Metabolic Panel: Recent Labs  Lab 05/31/18 0837  NA 138  K 3.8  CL 107  CO2 23  GLUCOSE 123*  BUN 14  CREATININE 0.66  CALCIUM 8.4*  MG 1.7    GFR: Estimated Creatinine Clearance: 42 mL/min (by C-G formula based on SCr of 0.66 mg/dL). Liver Function Tests: Recent Labs  Lab 05/31/18 0837  AST 23  ALT 15  ALKPHOS 84  BILITOT 0.6  PROT 5.9*  ALBUMIN 3.2*   Recent Labs  Lab 05/31/18 0837  LIPASE 14   Recent Labs  Lab 05/31/18 0837  AMMONIA 13   Coagulation Profile: No results for input(s): INR, PROTIME in the last 168 hours. Cardiac Enzymes: Recent Labs  Lab 05/31/18 0837  CKTOTAL 172   BNP (last 3 results) No results for input(s): PROBNP in the last 8760 hours. HbA1C: No results for input(s): HGBA1C in the last 72 hours. CBG: No results for input(s): GLUCAP in the last 168 hours. Lipid Profile: No results for input(s): CHOL, HDL, LDLCALC, TRIG, CHOLHDL, LDLDIRECT in the last 72 hours. Thyroid Function Tests: Recent Labs    05/31/18 0837  TSH 2.959   Anemia Panel: No results for input(s): VITAMINB12, FOLATE, FERRITIN, TIBC, IRON, RETICCTPCT in the last 72 hours. Urine analysis:    Component Value Date/Time   COLORURINE YELLOW 11/30/2017 1420   APPEARANCEUR CLEAR 11/30/2017 1420   LABSPEC 1.028 11/30/2017 1420   PHURINE 5.0 11/30/2017 1420   GLUCOSEU NEGATIVE 11/30/2017 1420   HGBUR NEGATIVE 11/30/2017 1420   BILIRUBINUR NEGATIVE 11/30/2017 1420   KETONESUR 5 (A) 11/30/2017 1420   PROTEINUR NEGATIVE 11/30/2017 1420   UROBILINOGEN 1.0 03/12/2012 1739   NITRITE NEGATIVE 11/30/2017 1420   LEUKOCYTESUR NEGATIVE 11/30/2017 1420   Sepsis Labs: !!!!!!!!!!!!!!!!!!!!!!!!!!!!!!!!!!!!!!!!!!!! (procalcitonin:4,lacticidven:4) )No results found for this or any previous visit (from the past 240 hour(s)).   Radiological Exams on Admission: Ct Head Wo Contrast  Result Date: 05/31/2018 CLINICAL DATA:  Altered level of consciousness. EXAM: CT HEAD WITHOUT CONTRAST TECHNIQUE: Contiguous axial images were obtained from the base of the skull through the vertex without intravenous contrast. COMPARISON:   11/30/2017 FINDINGS: Brain: No evidence of acute infarction, hemorrhage, hydrocephalus, extra-axial collection or mass lesion/mass effect. There is mild diffuse low-attenuation within the subcortical and periventricular white matter compatible with chronic microvascular disease. Prominence of the sulci and ventricles compatible with age related brain atrophy. Vascular: No hyperdense vessel or unexpected calcification. Skull: Normal. Negative for fracture or focal lesion. Sinuses/Orbits: No acute finding. Other: None IMPRESSION: 1. No acute intracranial abnormalities. 2. Chronic small vessel ischemic change and brain atrophy. Electronically Signed   By: Signa Kell M.D.   On: 05/31/2018 09:14   Dg Chest Portable 1 View  Result Date: 05/31/2018 CLINICAL DATA:  Cough.  Hypothermia. EXAM: PORTABLE CHEST 1 VIEW COMPARISON:  09/21/2015 FINDINGS: Cardiac enlargement and aortic atherosclerosis. Suspect small left pleural effusion. Moderate diffuse pulmonary edema identified. No focal airspace opacities identified. IMPRESSION: Cardiac enlargement, aortic atherosclerosis and pulmonary edema. Electronically Signed   By: Signa Kell M.D.   On: 05/31/2018 08:38    EKG: Independently reviewed.  Initial sinus bradycardia with no acute changes currently normal sinus rhythm  Old chart reviewed  Case discussed with Dr. Rush Landmark in the ED  Chest x-ray reviewed with cardiac enlargement and some mild edema  Assessment/Plan 85 year old female with dementia found outside this morning in the cold for an unknown period of time with hypothermia and metabolic encephalopathy Principal Problem:   Hypothermia with encephalopathy-this is all resolved with bear hugger.  Patient was also bradycardic and little hypotensive which is all resolving with resolution of her hypothermia.  She is currently off bear hugger and temperature is normalized.  Observe overnight on telemetry floor and monitor for stabilization before  discharging back to her memory care unit.  Active Problems:   Elevated troponin-cardiology has been called who does not think that this is cardiac in nature.  Could be due to demand ischemia.  We will continue to serial troponin.  No further cardiac work-up per cardiology team Dr. Anne Fu.    Coronary artery disease-noted    Primary progressive aphasia (HCC)-stable back to baseline    Dementia without behavioral disturbance (HCC)-stable back to  baseline   Metabolic encephalopathy-seems to be resolved with resolution of her hypothermia    DVT prophylaxis: SCDs Code Status: DNR confirmed with both sons Family Communication: 2 sons Disposition Plan: Observation Consults called: None Admission status: Observation   Madalen Gavin A MD [facility]  If 7PM-7AM, please contact night-coverage [email or web address] Password [email or web address]  05/31/2018, 11:55 AM

## 2018-05-31 NOTE — ED Notes (Signed)
Lunch tray ordered 

## 2018-05-31 NOTE — ED Notes (Signed)
Admitting MD paged regarding patient still wanting to get out of the hospital.  Ativan orders given.  Pt given ativan and now in a chair relaxing.

## 2018-05-31 NOTE — ED Provider Notes (Signed)
[facility] EMERGENCY DEPARTMENT Provider Note   CSN: [id number] Arrival date & time: 05/31/18  7829     History   Chief Complaint Chief Complaint  Patient presents with  . Cold Exposure  . Fall    HPI Michelle [name] is a 85 y.o. female.  The history is provided by the patient and medical records. No language interpreter was used.  Altered Mental Status   This is a new problem. The current episode started 3 to 5 hours ago. The problem has not changed since onset.Associated symptoms include confusion. Pertinent negatives include no weakness and no agitation. Her past medical history is significant for hypertension and heart disease.    Past Medical History:  Diagnosis Date  . Angina pectoris (HCC)    intrascapular, abnormal cardiolote preop TKR  . Arthritis   . ASCVD (arteriosclerotic cardiovascular disease)    S/P NSTEMI with LCX stent 2008, cath on 06/29/2008 revealed widely patent circumflex  . Bacterial infection 2016  . Coronary artery disease   . Diabetes mellitus   . GERD (gastroesophageal reflux disease)   . Heart attack (HCC) 2010  . Hyperlipidemia   . Hypertension   . Hypothyroidism   . Myocardial infarction [facility]) [id number]   with stents  . Rheumatoid arthritis (HCC) 2013  . Trigger finger 02/2013  . Vertigo 2016    Patient Active Problem List   Diagnosis Date Noted  . Osteoarthritis 06/26/2017  . Primary progressive aphasia (HCC) 07/30/2015  . Mixed hyperlipidemia 05/04/2013  . Hypertension   . Coronary artery disease   . Hypothyroidism   . GERD (gastroesophageal reflux disease)   . Arthritis   . Myocardial infarction (HCC)   . Osteoarthritis of right knee 02/26/2012    Past Surgical History:  Procedure Laterality Date  . ABDOMINAL HYSTERECTOMY  1969  . BREAST BIOPSY  2005   right -benign  . BREAST SURGERY  2004   right -cancerous-with radiation  . broken finger  2006  . CORONARY ANGIOPLASTY  2008, 2010  . foot ampuation   12/2009   right  . HAMMER TOE SURGERY  06/2009   right  . HERNIA REPAIR  [id number]  . KNEE ARTHROSCOPY  1980 ,1994   bilateral  . TOTAL GASTRECTOMY  1983   with vagotomy  . TOTAL KNEE ARTHROPLASTY  02/26/2012   Procedure: TOTAL KNEE ARTHROPLASTY;  Surgeon: Jacki Cones, MD;  Location: [facility];  Service: Orthopedics;  Laterality: Right;     OB History   No obstetric history on file.      Home Medications    Prior to Admission medications   Medication Sig Start Date End Date Taking? Authorizing Provider  doxycycline (VIBRAMYCIN) 100 MG capsule Take 1 capsule (100 mg total) by mouth 2 (two) times daily. One po bid x 7 days 11/26/17   Charlynne Pander, MD  nitroGLYCERIN (NITROSTAT) 0.4 MG SL tablet Place 1 tablet (0.4 mg total) under the tongue every 5 (five) minutes as needed for chest pain. Patient not taking: Reported on 10/17/2017 01/11/16   Robbie Lis M, PA-C  traZODone (DESYREL) 50 MG tablet Take 50 mg by mouth daily as needed. 11/24/17   [provider]    Family History Family History  Problem Relation Age of Onset  . Dementia Neg Hx     Social History Social History   Tobacco Use  . Smoking status: Never Smoker  . Smokeless tobacco: Never Used  Substance Use Topics  . Alcohol use:  No  . Drug use: No     Allergies   Patient has no known allergies.   Review of Systems Review of Systems  Constitutional: Positive for chills and fatigue. Negative for diaphoresis.  HENT: Negative for congestion.   Eyes: Negative for visual disturbance.  Respiratory: Positive for cough. Negative for chest tightness, shortness of breath and wheezing.   Cardiovascular: Negative for chest pain, palpitations and leg swelling.  Gastrointestinal: Negative for constipation, diarrhea, nausea and vomiting.  Genitourinary: Negative for flank pain.  Musculoskeletal: Negative for back pain, neck pain and neck stiffness.  Skin: Negative for rash and wound.  Neurological:  Negative for weakness, light-headedness, numbness and headaches.  Psychiatric/Behavioral: Positive for confusion. Negative for agitation.  All other systems reviewed and are negative.    Physical Exam Updated Vital Signs There were no vitals taken for this visit.  Physical Exam Vitals signs and nursing note reviewed.  Constitutional:      General: She is not in acute distress.    Appearance: She is not ill-appearing, toxic-appearing or diaphoretic.  HENT:     Head: Normocephalic and atraumatic.     Nose: No congestion or rhinorrhea.     Mouth/Throat:     Pharynx: No oropharyngeal exudate or posterior oropharyngeal erythema.  Eyes:     Extraocular Movements: Extraocular movements intact.     Conjunctiva/sclera: Conjunctivae normal.     Pupils: Pupils are equal, round, and reactive to light.  Neck:     Musculoskeletal: No neck rigidity.  Cardiovascular:     Rate and Rhythm: Normal rate.     Pulses: Normal pulses.     Heart sounds: No murmur.  Pulmonary:     Breath sounds: Rhonchi present.  Abdominal:     General: Abdomen is flat. There is no distension.     Tenderness: There is no abdominal tenderness.  Musculoskeletal:        General: Tenderness present.     Comments: Abrasion and tenderness on R knee   Skin:    Capillary Refill: Capillary refill takes less than 2 seconds.     Findings: No rash.     Comments: Cold to the touch   Neurological:     General: No focal deficit present.     Mental Status: She is alert. She is disoriented.     Sensory: No sensory deficit.     Motor: No weakness.  Psychiatric:        Mood and Affect: Mood normal.      ED Treatments / Results  Labs (all labs ordered are listed, but only abnormal results are displayed) Labs Reviewed  CBC WITH DIFFERENTIAL/PLATELET - Abnormal; Notable for the following components:      Result Value   WBC 12.1 (*)    Hemoglobin 11.6 (*)    Neutro Abs 10.9 (*)    All other components within normal  limits  COMPREHENSIVE METABOLIC PANEL - Abnormal; Notable for the following components:   Glucose, Bld 123 (*)    Calcium 8.4 (*)    Total Protein 5.9 (*)    Albumin 3.2 (*)    All other components within normal limits  BRAIN NATRIURETIC PEPTIDE - Abnormal; Notable for the following components:   B Natriuretic Peptide 155.7 (*)    All other components within normal limits  TROPONIN I - Abnormal; Notable for the following components:   Troponin I 0.98 (*)    All other components within normal limits  I-STAT TROPONIN, ED - Abnormal; Notable  for the following components:   Troponin i, poc 0.44 (*)    All other components within normal limits  URINE CULTURE  MAGNESIUM  LIPASE, BLOOD  TSH  CK  AMMONIA  TROPONIN I  TROPONIN I  I-STAT CG4 LACTIC ACID, ED    EKG EKG Interpretation  Date/Time:  Sunday May 31 2018 08:04:46 EST Ventricular Rate:  50 PR Interval:    QRS Duration: 109 QT Interval:  456 QTC Calculation: 416 R Axis:   77 Text Interpretation:  Sinus rhythm Abnormal R-wave progression, early transition Nonspecific repol abnormality, diffuse leads Baseline wander in lead(s) V4 V5 V6 When compared to prior, similar bradycardia.  No STEMI Confirmed by Theda Belfastegeler, Chris ([id number]) on 05/31/2018 8:36:30 AM   Radiology Ct Head Wo Contrast  Result Date: 05/31/2018 CLINICAL DATA:  Altered level of consciousness. EXAM: CT HEAD WITHOUT CONTRAST TECHNIQUE: Contiguous axial images were obtained from the base of the skull through the vertex without intravenous contrast. COMPARISON:  11/30/2017 FINDINGS: Brain: No evidence of acute infarction, hemorrhage, hydrocephalus, extra-axial collection or mass lesion/mass effect. There is mild diffuse low-attenuation within the subcortical and periventricular white matter compatible with chronic microvascular disease. Prominence of the sulci and ventricles compatible with age related brain atrophy. Vascular: No hyperdense vessel or unexpected  calcification. Skull: Normal. Negative for fracture or focal lesion. Sinuses/Orbits: No acute finding. Other: None IMPRESSION: 1. No acute intracranial abnormalities. 2. Chronic small vessel ischemic change and brain atrophy. Electronically Signed   By: Signa Kellaylor  Stroud M.D.   On: 05/31/2018 09:14   Dg Chest Portable 1 View  Result Date: 05/31/2018 CLINICAL DATA:  Cough.  Hypothermia. EXAM: PORTABLE CHEST 1 VIEW COMPARISON:  09/21/2015 FINDINGS: Cardiac enlargement and aortic atherosclerosis. Suspect small left pleural effusion. Moderate diffuse pulmonary edema identified. No focal airspace opacities identified. IMPRESSION: Cardiac enlargement, aortic atherosclerosis and pulmonary edema. Electronically Signed   By: Signa Kellaylor  Stroud M.D.   On: 05/31/2018 08:38    Procedures Procedures (including critical care time)  CRITICAL CARE Performed by: Canary Brimhristopher J Jaylan Hinojosa Total critical care time: 45 minutes Critical care time was exclusive of separately billable procedures and treating other patients. Critical care was necessary to treat or prevent imminent or life-threatening deterioration. Critical care was time spent personally by me on the following activities: development of treatment plan with patient and/or surrogate as well as nursing, discussions with consultants, evaluation of patient's response to treatment, examination of patient, obtaining history from patient or surrogate, ordering and performing treatments and interventions, ordering and review of laboratory studies, ordering and review of radiographic studies, pulse oximetry and re-evaluation of patient's condition.   Medications Ordered in ED Medications  sertraline (ZOLOFT) tablet 50 mg (has no administration in time range)  busPIRone (BUSPAR) tablet 10 mg (10 mg Oral Given 05/31/18 1234)  sodium chloride flush (NS) 0.9 % injection 3 mL (3 mLs Intravenous Given 05/31/18 1214)  sodium chloride flush (NS) 0.9 % injection 3 mL (has no  administration in time range)  0.9 %  sodium chloride infusion (has no administration in time range)     Initial Impression / Assessment and Plan / ED Course  I have reviewed the triage vital signs and the nursing notes.  Pertinent labs & imaging results that were available during my care of the patient were reviewed by me and considered in my medical decision making (see chart for details).     Michelle [name] is a 85 y.o. female with a past medical history significant for hypertension, CAD  status post MI and PCI, diabetes, hyperlipidemia, primary progressive aphasia who presents from a nursing facility for unwitnessed fall and downtime outside and altered mental status.  Patient was brought in by EMS report that she was last seen around 530 or 545 this morning before she was then found several hours later outside in the dirt after having an unwitnessed fall outside.  It is unclear how she got out of the facility or the context of her fall.  Patient does not remember.  Is unclear what her mental status baseline is.  Patient was found to be called by EMS and was also having some bradycardia.  On arrival, patient's rectal temperature was 95.8 degrees Fahrenheit.  She was bradycardic with rates going into the 40s.  Blood pressure was between 101 120 systolic on arrival.  Patient reports that she is been having some cough for several days and has been feeling tired.  She does report having some chills.  She denies nausea, vomiting, urinary symptoms or GI symptoms.  She is alert to her name only.  Unclear what her baseline is.  Patient has an abrasion and some pain at her right knee but otherwise is denying extremity pains.  She denies significant headache.  She does not know how long she was on the ground for in the cold.  On exam, patient has coarse breath sounds bilaterally.  Patient is hypoxic with oxygen saturations in the mid 80s.  She was placed on 2 L nasal cannula with improvement.  Chest was  nontender, abdomen was nontender.  Head showed no direct evidence of trauma and pupils were symmetric and reactive bilaterally.  Normal extraocular movements.  Normal sensation throughout.  Patient had abrasion and tenderness on her right knee but otherwise had normal pulses sensation and strength in the feet.  Patient is following all commands.  Clinically I am concerned patient may have pneumonia given the hypoxia, reported cough, and hypothermia.  She will have x-ray and labs as well as a head CT from the fall.  Due to her thyroid disease with her low temperature and bradycardia, will check TSH.  Anticipate admission for the new oxygen requirement and cough with concern for possible infection.  8:59 AM Diagnostic testing began to return.  Troponin is elevated 0.44.  Mild leukocytosis present of 12.1.  Mild anemia.  Lactic acid is not elevated.  Nursing reports that patient had significant increase in heart rate when she went into the CT scanner with rate going up to around 150.  This lasted for approximately 1 minute before dropping back to bradycardia.  As patient has positive troponin and is now on oxygen still mentation, patient will require admission.  Will await rest of work-up.  10:06 AM Spoke with cardiology in regards to the positive troponin and the patient's heart rate changes.  They said that at this time they did not feel a consultation was necessary.  They will be happy to see patient if admitting team feels they are needed.  Patient will be called for admission for new oxygen requirement, positive troponin, and a fall.  Will defer to admitting team for possible antibiotics given the clinical concern for infection and pneumonia.   Final Clinical Impressions(s) / ED Diagnoses   Final diagnoses:  Fall, initial encounter  Elevated troponin level  Hypoxia  Hypothermia, initial encounter     Clinical Impression: 1. Fall, initial encounter   2. Elevated troponin level   3.  Hypoxia   4. Hypothermia, initial encounter  Disposition: Admit  This note was prepared with assistance of Conservation officer, historic buildingsDragon voice recognition software. Occasional wrong-word or sound-a-like substitutions may have occurred due to the inherent limitations of voice recognition software.     Brandelyn Henne, Canary Brimhristopher J, MD 05/31/18 1600

## 2018-06-01 ENCOUNTER — Ambulatory Visit (HOSPITAL_BASED_OUTPATIENT_CLINIC_OR_DEPARTMENT_OTHER): Payer: Medicare Other

## 2018-06-01 DIAGNOSIS — T68XXXS Hypothermia, sequela: Secondary | ICD-10-CM | POA: Diagnosis not present

## 2018-06-01 DIAGNOSIS — G3101 Pick's disease: Secondary | ICD-10-CM | POA: Diagnosis not present

## 2018-06-01 DIAGNOSIS — R7989 Other specified abnormal findings of blood chemistry: Secondary | ICD-10-CM | POA: Diagnosis not present

## 2018-06-01 DIAGNOSIS — F0390 Unspecified dementia without behavioral disturbance: Secondary | ICD-10-CM

## 2018-06-01 DIAGNOSIS — F0280 Dementia in other diseases classified elsewhere without behavioral disturbance: Secondary | ICD-10-CM

## 2018-06-01 DIAGNOSIS — T68XXXA Hypothermia, initial encounter: Secondary | ICD-10-CM | POA: Diagnosis not present

## 2018-06-01 DIAGNOSIS — I340 Nonrheumatic mitral (valve) insufficiency: Secondary | ICD-10-CM | POA: Diagnosis not present

## 2018-06-01 LAB — CBC
HCT: 33 % — ABNORMAL LOW (ref 36.0–46.0)
Hemoglobin: 10.7 g/dL — ABNORMAL LOW (ref 12.0–15.0)
MCH: 29.8 pg (ref 26.0–34.0)
MCHC: 32.4 g/dL (ref 30.0–36.0)
MCV: 91.9 fL (ref 80.0–100.0)
Platelets: 244 10*3/uL (ref 150–400)
RBC: 3.59 MIL/uL — AB (ref 3.87–5.11)
RDW: 14.6 % (ref 11.5–15.5)
WBC: 7.2 10*3/uL (ref 4.0–10.5)
nRBC: 0 % (ref 0.0–0.2)

## 2018-06-01 LAB — ECHOCARDIOGRAM COMPLETE
Height: 64 in
Weight: 2321.6 oz

## 2018-06-01 LAB — BASIC METABOLIC PANEL
ANION GAP: 6 (ref 5–15)
BUN: 11 mg/dL (ref 8–23)
CO2: 23 mmol/L (ref 22–32)
Calcium: 8.7 mg/dL — ABNORMAL LOW (ref 8.9–10.3)
Chloride: 109 mmol/L (ref 98–111)
Creatinine, Ser: 0.64 mg/dL (ref 0.44–1.00)
GFR calc non Af Amer: >60 mL/min (ref >60)
Glucose, Bld: 102 mg/dL — ABNORMAL HIGH (ref 70–99)
Potassium: 3.6 mmol/L (ref 3.5–5.1)
Sodium: 138 mmol/L (ref 135–145)

## 2018-06-01 LAB — TROPONIN I: Troponin I: 0.64 ng/mL (ref <0.03)

## 2018-06-01 MED ORDER — CEFUROXIME AXETIL 500 MG PO TABS: 500.0000 mg | ORAL_TABLET | Freq: Two times a day (BID) | ORAL | 0 refills | Status: AC

## 2018-06-01 NOTE — Progress Notes (Signed)
Gave report to [employer] in [place] at [facility]. Patient awaiting PTAR

## 2018-06-01 NOTE — Social Work (Signed)
Patient will discharge back to the [facility] at [facility] Anticipated discharge date: 06/01/18 Family notified: Dahlia Bailiff, son Transportation by: PTAR  Nurse to call report to [phone].  CSW signing off.  Abigail Butts, LCSWA  Clinical Social Worker

## 2018-06-01 NOTE — NC FL2 (Signed)
  [place] MEDICAID FL2 LEVEL OF CARE SCREENING TOOL     IDENTIFICATION  Patient Name: Michelle [name] Birthdate: 03/27/1928 Sex: female Admission Date (Current Location): 05/31/2018  [facility] and [place] Number:  Producer, television/film/video and Address:  [facility], [address] [place], [place] [address]      Provider Number: [id number]  Attending Physician Name and Address:  Joseph Art, DO  Relative Name and Phone Number:  Doriann Constantino, son, [phone]    Current Level of Care: Hospital Recommended Level of Care: Assisted Living Facility, Memory Care Prior Approval Number:    Date Approved/Denied:   PASRR Number:    Discharge Plan: Domiciliary (Rest home)(ALF)    Current Diagnoses: Patient Active Problem List   Diagnosis Date Noted  . Hypothermia 05/31/2018  . Dementia without behavioral disturbance (HCC) 05/31/2018  . Elevated troponin 05/31/2018  . Osteoarthritis 06/26/2017  . Primary progressive aphasia (HCC) 07/30/2015  . Mixed hyperlipidemia 05/04/2013  . Hypertension   . Coronary artery disease   . Hypothyroidism   . GERD (gastroesophageal reflux disease)   . Arthritis   . Myocardial infarction (HCC)   . Osteoarthritis of right knee 02/26/2012    Orientation RESPIRATION BLADDER Height & Weight        Normal Incontinent Weight: 65.8 kg Height:  5\' 4"  (162.6 cm)  BEHAVIORAL SYMPTOMS/MOOD NEUROLOGICAL BOWEL NUTRITION STATUS      Continent Diet(regular/please see DC summary)  AMBULATORY STATUS COMMUNICATION OF NEEDS Skin   (baseline) Verbally Normal                       Personal Care Assistance Level of Assistance  Bathing, Feeding, Dressing Bathing Assistance: (baseline) Feeding assistance: (baseline) Dressing Assistance: (baseline)     Functional Limitations Info  Sight, Hearing, Speech Sight Info: Adequate Hearing Info: Adequate Speech Info: Impaired    SPECIAL CARE FACTORS FREQUENCY                       Contractures Contractures Info: Not present    Additional Factors Info  Code Status, Allergies, Psychotropic Code Status Info: DNR Allergies Info: No Known Allergies Psychotropic Info: buspar, zoloft         Current Medications (06/01/2018):  This is the current hospital active medication list Current Facility-Administered Medications  Medication Dose Route Frequency Provider Last Rate Last Dose  . 0.9 %  sodium chloride infusion  250 mL Intravenous PRN Haydee Monica, MD      . busPIRone (BUSPAR) tablet 10 mg  10 mg Oral TID Haydee Monica, MD   10 mg at 06/01/18 1151  . LORazepam (ATIVAN) injection 2 mg  2 mg Intravenous Q4H PRN Haydee Monica, MD   2 mg at 05/31/18 1852  . sertraline (ZOLOFT) tablet 50 mg  50 mg Oral Daily Tarry Kos A, MD   50 mg at 06/01/18 1151  . sodium chloride flush (NS) 0.9 % injection 3 mL  3 mL Intravenous Q12H Tarry Kos A, MD   3 mL at 06/01/18 1156  . sodium chloride flush (NS) 0.9 % injection 3 mL  3 mL Intravenous PRN Haydee Monica, MD         Discharge Medications: Please see discharge summary for a list of discharge medications.  Relevant Imaging Results:  Relevant Lab Results:   Additional Information    Abigail Butts, LCSW

## 2018-06-01 NOTE — Progress Notes (Signed)
Pt has been incontinent of urine x2-unable to collect urine culture. Urine does have  strong odor. Will pass off in report to oncoming RN. Dierdre Highman, RN

## 2018-06-01 NOTE — Evaluation (Signed)
Physical Therapy Evaluation Patient Details Name: Michelle [name] MRN: [id number] DOB: 1928-04-16 Today's Date: 06/01/2018   History of Present Illness  Patient is a 85 y/o female who was found down locked outside of her memory care facility covered in dirt for a prolonger period of time. Found to be hypothermic and encephalopathy. PMH includes dementia, progressive aphasia, MI, HTN, DM, CAD, RA, vertigo.  Clinical Impression  Patient presents with pain, generalized weakness, impaired balance and impaired mobility s/p above. Pt with baseline dementia so not able to provide any information about PLOF/history. Pt tolerated transfers and gait training with Min A for balance/safety. HR up to 170s bpm during mobility; pt asymptomatic. Pt from memory care unit; if facility able to provide min A initially, pt safe to return there with help. Will follow acutely to maximize independence and mobility prior to return home.    Follow Up Recommendations No PT follow up;Supervision for mobility/OOB;Supervision/Assistance - 24 hour    Equipment Recommendations  None recommended by PT    Recommendations for Other Services       Precautions / Restrictions Precautions Precautions: Fall Precaution Comments: watch HR Restrictions Weight Bearing Restrictions: No      Mobility  Bed Mobility Overal bed mobility: Needs Assistance Bed Mobility: Supine to Sit     Supine to sit: Min assist;HOB elevated     General bed mobility comments: Assist to elevate trunk to get to EOB. Increased time and cues.  Transfers Overall transfer level: Needs assistance Equipment used: 1 person hand held assist Transfers: Sit to/from Stand Sit to Stand: Min assist         General transfer comment: Assist to power to standing with cues for anterior weight shift, posterior lean initially.  Ambulation/Gait Ambulation/Gait assistance: Min assist Gait Distance (Feet): 120 Feet Assistive device: 1 person hand held  assist Gait Pattern/deviations: Step-through pattern;Decreased stride length;Antalgic;Drifts right/left Gait velocity: decreased   General Gait Details: Slow, unsteady gait with HHA for support; HR up to 170s bpm, pt seemed asymptomatic??  Stairs            Wheelchair Mobility    Modified Rankin (Stroke Patients Only)       Balance Overall balance assessment: Needs assistance Sitting-balance support: Feet supported;No upper extremity supported Sitting balance-Leahy Scale: Fair     Standing balance support: During functional activity;Single extremity supported Standing balance-Leahy Scale: Poor Standing balance comment: Requires at least UE support in standing.                              Pertinent Vitals/Pain Pain Assessment: Faces Faces Pain Scale: Hurts a little bit Pain Location: points to right knee Pain Descriptors / Indicators: Grimacing Pain Intervention(s): Monitored during session    Home Living Family/patient expects to be discharged to:: Assisted living(Return to memory care unit at [facility])                      Prior Function           Comments: Pt not able to provide any PLOF/history secondary to hx of dementia but seems to be independent with ambulation at facility.      Hand Dominance        Extremity/Trunk Assessment   Upper Extremity Assessment Upper Extremity Assessment: Defer to OT evaluation    Lower Extremity Assessment Lower Extremity Assessment: Generalized weakness;RLE deficits/detail RLE Deficits / Details: bruise on right knee from  fall RLE: Unable to fully assess due to pain       Communication   Communication: Other (comment)(low toned voice difficult to comprehend at times)  Cognition Arousal/Alertness: Awake/alert Behavior During Therapy: WFL for tasks assessed/performed Overall Cognitive Status: History of cognitive impairments - at baseline                                  General Comments: Knows her name and bday, A&Ox1. Talking about how all her family is dead and she just wants to be loved.       General Comments      Exercises     Assessment/Plan    PT Assessment Patient needs continued PT services  PT Problem List Decreased strength;Decreased mobility;Decreased safety awareness;Decreased cognition;Decreased balance;Cardiopulmonary status limiting activity;Decreased skin integrity       PT Treatment Interventions Balance training;Gait training;Therapeutic exercise;Therapeutic activities;Functional mobility training    PT Goals (Current goals can be found in the Care Plan section)  Acute Rehab PT Goals Patient Stated Goal: none stated PT Goal Formulation: Patient unable to participate in goal setting Time For Goal Achievement: 06/15/18 Potential to Achieve Goals: Fair    Frequency Min 3X/week   Barriers to discharge Decreased caregiver support not sure of level of support at memory care unit    Co-evaluation               AM-PAC PT "6 Clicks" Mobility  Outcome Measure Help needed turning from your back to your side while in a flat bed without using bedrails?: A Little Help needed moving from lying on your back to sitting on the side of a flat bed without using bedrails?: A Little Help needed moving to and from a bed to a chair (including a wheelchair)?: A Little Help needed standing up from a chair using your arms (e.g., wheelchair or bedside chair)?: A Little Help needed to walk in hospital room?: A Little Help needed climbing 3-5 steps with a railing? : A Lot 6 Click Score: 17    End of Session Equipment Utilized During Treatment: Gait belt Activity Tolerance: Patient tolerated treatment well Patient left: in bed;with call bell/phone within reach;with bed alarm set Nurse Communication: Mobility status PT Visit Diagnosis: Pain;Muscle weakness (generalized) (M62.81);Difficulty in walking, not elsewhere classified (R26.2) Pain  - Right/Left: Right Pain - part of body: Knee    Time: 2010-0712 PT Time Calculation (min) (ACUTE ONLY): 20 min   Charges:   PT Evaluation $PT Eval Moderate Complexity: 1 Mod          Mylo Red, PT, DPT Acute Rehabilitation Services Pager [phone] Office [phone]      Blake Divine A Lanier Ensign 06/01/2018, 12:29 PM

## 2018-06-01 NOTE — Discharge Summary (Addendum)
Physician Discharge Summary  Michelle [name] JSH:[id number] DOB: [birth year] DOA: 05/31/2018  PCP: Ralene Ok, MD  Admit date: 05/31/2018 Discharge date: 06/01/2018  Admitted From: ALF Discharge disposition: ALF   Recommendations for Outpatient Follow-Up:   1. 3 days of ceftin for presumed UTI-- unable to get urine and patient not able to articulate if she is having symptoms 2. On tele with exertion HR elevated but returned to normal at rest - patient appeared to be asymptomatic 3. Consider Palliative care referral    Discharge Diagnosis:   Principal Problem:   Hypothermia Active Problems:   Coronary artery disease   Primary progressive aphasia (HCC)   Dementia without behavioral disturbance (HCC)   Elevated troponin    Discharge Condition: Improved.  Diet recommendation:   Regular.  Wound care: None.  Code status: DNR   History of Present Illness:  Michelle [name] is a 85 y.o. female with medical history significant of dementia, coronary artery disease, progressive aphasia has been in memory care unit since August 2019 was found this morning locked outside in their garden area for an unknown period of time with dirt all over her.  It is a locked unit however is unknown if the garden area which is outside is locked or not.  She was last seen per our records by their staff at 545 this morning.  She was brought in at 8:09 AM this morning.  So it seems she was at least locked outside for an hour or so.  Patient was found to be hardly responsive and very cold brought to the emergency department.  She of course does not recall the events this morning.  She has 2 sons that are present with her right now they last saw her yesterday.  They report she has been in her normal state of health until this event this morning.  She has not had any fevers no cough no shortness of breath no nausea vomiting or diarrhea.  Patient found to have a temperature of 95 was placed on a bear hugger and as  she was warmed up her mental status improved back to her baseline.  She has been been bradycardic at first which is seem to normalized.  Patient is being referred for admission for hypothermia.   Hospital Course by Problem:    Hypothermia with encephalopathy- -resolved with bear hugger  UTI -3 days of ceftin    Elevated troponin-cardiology has been called who does not think that this is cardiac in nature.  -suspect demand ischemia from hypothermia.  No further cardiac work-up per cardiology team Dr. Anne Fu. -echo done (BNP slightly elevated as well)- with good EF and no wall motion abnormality    Coronary artery disease- outpatient follow up    Primary progressive aphasia (HCC)-stable back to baseline    Dementia with behavioral disturbance (HCC)- per family, back to baseline   Metabolic encephalopathy-resolved with resolution of her hypothermia but will treat for uncomplicated UTI due to reports of foul smelling urine       Medical Consultants:      Discharge Exam:   Vitals:   06/01/18 0547 06/01/18 1255  BP: 130/61   Pulse: (!) 55   Resp: 20   Temp: 99 F (37.2 C) 98.6 F (37 C)  SpO2: 95%    Vitals:   05/31/18 1900 05/31/18 2015 06/01/18 0547 06/01/18 1255  BP:  125/64 130/61   Pulse: 82 (!) 58 (!) 55   Resp:  19  20   Temp:  98.4 F (36.9 C) 99 F (37.2 C) 98.6 F (37 C)  TempSrc:  Oral Axillary Oral  SpO2: 96% 98% 95%   Weight:   65.8 kg   Height:        General exam: anxious appearing and scared  The results of significant diagnostics from this hospitalization (including imaging, microbiology, ancillary and laboratory) are listed below for reference.     Procedures and Diagnostic Studies:   Ct Head Wo Contrast  Result Date: 05/31/2018 CLINICAL DATA:  Altered level of consciousness. EXAM: CT HEAD WITHOUT CONTRAST TECHNIQUE: Contiguous axial images were obtained from the base of the skull through the vertex without intravenous contrast.  COMPARISON:  11/30/2017 FINDINGS: Brain: No evidence of acute infarction, hemorrhage, hydrocephalus, extra-axial collection or mass lesion/mass effect. There is mild diffuse low-attenuation within the subcortical and periventricular white matter compatible with chronic microvascular disease. Prominence of the sulci and ventricles compatible with age related brain atrophy. Vascular: No hyperdense vessel or unexpected calcification. Skull: Normal. Negative for fracture or focal lesion. Sinuses/Orbits: No acute finding. Other: None IMPRESSION: 1. No acute intracranial abnormalities. 2. Chronic small vessel ischemic change and brain atrophy. Electronically Signed   By: Signa Kell M.D.   On: 05/31/2018 09:14   Dg Chest Portable 1 View  Result Date: 05/31/2018 CLINICAL DATA:  Cough.  Hypothermia. EXAM: PORTABLE CHEST 1 VIEW COMPARISON:  09/21/2015 FINDINGS: Cardiac enlargement and aortic atherosclerosis. Suspect small left pleural effusion. Moderate diffuse pulmonary edema identified. No focal airspace opacities identified. IMPRESSION: Cardiac enlargement, aortic atherosclerosis and pulmonary edema. Electronically Signed   By: Signa Kell M.D.   On: 05/31/2018 08:38     Labs:   Basic Metabolic Panel: Recent Labs  Lab 05/31/18 0837 06/01/18 0125  NA 138 138  K 3.8 3.6  CL 107 109  CO2 23 23  GLUCOSE 123* 102*  BUN 14 11  CREATININE 0.66 0.64  CALCIUM 8.4* 8.7*  MG 1.7  --    GFR Estimated Creatinine Clearance: 45.4 mL/min (by C-G formula based on SCr of 0.64 mg/dL). Liver Function Tests: Recent Labs  Lab 05/31/18 0837  AST 23  ALT 15  ALKPHOS 84  BILITOT 0.6  PROT 5.9*  ALBUMIN 3.2*   Recent Labs  Lab 05/31/18 0837  LIPASE 14   Recent Labs  Lab 05/31/18 0837  AMMONIA 13   Coagulation profile No results for input(s): INR, PROTIME in the last 168 hours.  CBC: Recent Labs  Lab 05/31/18 0837 06/01/18 0125  WBC 12.1* 7.2  NEUTROABS 10.9*  --   HGB 11.6* 10.7*  HCT  36.9 33.0*  MCV 93.4 91.9  PLT 248 244   Cardiac Enzymes: Recent Labs  Lab 05/31/18 0837 05/31/18 1214 05/31/18 1951 06/01/18 0125  CKTOTAL 172  --   --   --   TROPONINI  --  0.98* 0.79* 0.64*   BNP: Invalid input(s): POCBNP CBG: No results for input(s): GLUCAP in the last 168 hours. D-Dimer No results for input(s): DDIMER in the last 72 hours. Hgb A1c No results for input(s): HGBA1C in the last 72 hours. Lipid Profile No results for input(s): CHOL, HDL, LDLCALC, TRIG, CHOLHDL, LDLDIRECT in the last 72 hours. Thyroid function studies Recent Labs    05/31/18 0837  TSH 2.959   Anemia work up No results for input(s): VITAMINB12, FOLATE, FERRITIN, TIBC, IRON, RETICCTPCT in the last 72 hours. Microbiology No results found for this or any previous visit (from the past 240 hour(s)).  Discharge Instructions:   Discharge Instructions    Diet general   Complete by:  As directed    Increase activity slowly   Complete by:  As directed      Allergies as of 06/01/2018   No Known Allergies     Medication List    STOP taking these medications   doxycycline 100 MG capsule Commonly known as:  VIBRAMYCIN   nitroGLYCERIN 0.4 MG SL tablet Commonly known as:  NITROSTAT     TAKE these medications   acetaminophen 325 MG tablet Commonly known as:  TYLENOL Take 650 mg by mouth every 6 (six) hours as needed for moderate pain.   busPIRone 10 MG tablet Commonly known as:  BUSPAR Take 10 mg by mouth 3 (three) times daily.   cefUROXime 500 MG tablet Commonly known as:  CEFTIN Take 1 tablet (500 mg total) by mouth 2 (two) times daily for 3 days.   sertraline 50 MG tablet Commonly known as:  ZOLOFT Take 50 mg by mouth daily.         Time coordinating discharge: 25 min  Signed:  Joseph ArtJessica U Ibrahim Mcpheeters DO  [facility] 06/01/2018, 1:00 PM

## 2018-06-02 ENCOUNTER — Encounter: Payer: Medicare Other | Admitting: Internal Medicine

## 2018-06-26 ENCOUNTER — Encounter: Payer: Medicare Other | Admitting: Internal Medicine

## 2018-06-26 NOTE — Progress Notes (Unsigned)
[facility] Telephone: [phone] Fax: [phone]  PATIENT NAME: Michelle [name] Jimerson DOB: 1927/08/18 MRN: [id number]  PRIMARY CARE PROVIDER:   Ralene OkMoreira, Roy, MD  REFERRING PROVIDER: Seward CarolKristi [phone], NP RESPONSIBLE PARTY:  Barnetta HammersmithFrancis X. Lemus [phone]        RECOMMENDATIONS and PLAN:  Palliative Care Encounter  1.  Memory Loss:  FAST stage 7a.    I spent 30 minutes providing this consultation,  from 10:30 to 11:00am at [facility]. More than 50% of the time in this consultation was spent coordinating communication with patient, clinical staff and son Thelma BargeFrancis via phone.   HISTORY OF PRESENT ILLNESS:  Michelle [name] Norwood is a 85 y.o. year old female with multiple medical problems including ***. Palliative Care was asked to help address goals of care.   CODE STATUS:   PPS: 0% HOSPICE ELIGIBILITY/DIAGNOSIS: TBD  PAST MEDICAL HISTORY:  Past Medical History:  Diagnosis Date  . Angina pectoris (HCC)    intrascapular, abnormal cardiolote preop TKR  . Arthritis   . ASCVD (arteriosclerotic cardiovascular disease)    S/P NSTEMI with LCX stent 2008, cath on 06/29/2008 revealed widely patent circumflex  . Bacterial infection 2016  . Coronary artery disease   . Diabetes mellitus   . GERD (gastroesophageal reflux disease)   . Heart attack (HCC) 2010  . Hyperlipidemia   . Hypertension   . Hypothyroidism   . Myocardial infarction [facility](HCC) [id number]   with stents  . Rheumatoid arthritis (HCC) 2013  . Trigger finger 02/2013  . Vertigo 2016    SOCIAL HX:  Social History   Tobacco Use  . Smoking status: Never Smoker  . Smokeless tobacco: Never Used  Substance Use Topics  . Alcohol use: No    ALLERGIES: No Known Allergies   PERTINENT MEDICATIONS:  Outpatient Encounter Medications as of 06/26/2018  Medication Sig  . acetaminophen (TYLENOL) 325 MG tablet Take 650 mg by mouth every 6 (six) hours as needed for moderate pain.  . busPIRone (BUSPAR) 10 MG tablet Take 10 mg by mouth  3 (three) times daily.  . sertraline (ZOLOFT) 50 MG tablet Take 50 mg by mouth daily.   No facility-administered encounter medications on file as of 06/26/2018.     PHYSICAL EXAM:   General: NAD, frail appearing, thin Cardiovascular: regular rate and rhythm Pulmonary: clear ant fields Abdomen: soft, nontender, + bowel sounds GU: no suprapubic tenderness Extremities: no edema, no joint deformities Skin: no rashes Neurological: Weakness but otherwise nonfocal  Margaretha SheffieldShirley Aiva Miskell, NP

## 2018-07-30 ENCOUNTER — Emergency Department (HOSPITAL_COMMUNITY): Payer: Medicare Other

## 2018-07-30 ENCOUNTER — Encounter (HOSPITAL_COMMUNITY): Payer: Self-pay | Admitting: Emergency Medicine

## 2018-07-30 ENCOUNTER — Inpatient Hospital Stay (HOSPITAL_COMMUNITY)
Admission: EM | Admit: 2018-07-30 | Discharge: 2018-08-04 | DRG: 481 | Disposition: A | Discharge status: Skilled Nursing Facility | Payer: Medicare Other | Source: Skilled Nursing Facility | Attending: Internal Medicine | Admitting: Internal Medicine

## 2018-07-30 DIAGNOSIS — Y92129 Unspecified place in nursing home as the place of occurrence of the external cause: Secondary | ICD-10-CM | POA: Diagnosis not present

## 2018-07-30 DIAGNOSIS — Z955 Presence of coronary angioplasty implant and graft: Secondary | ICD-10-CM | POA: Diagnosis not present

## 2018-07-30 DIAGNOSIS — F0280 Dementia in other diseases classified elsewhere without behavioral disturbance: Secondary | ICD-10-CM | POA: Diagnosis present

## 2018-07-30 DIAGNOSIS — G3101 Pick's disease: Secondary | ICD-10-CM | POA: Diagnosis present

## 2018-07-30 DIAGNOSIS — S72142A Displaced intertrochanteric fracture of left femur, initial encounter for closed fracture: Principal | ICD-10-CM | POA: Diagnosis present

## 2018-07-30 DIAGNOSIS — R000 Tachycardia, unspecified: Secondary | ICD-10-CM | POA: Diagnosis not present

## 2018-07-30 DIAGNOSIS — E119 Type 2 diabetes mellitus without complications: Secondary | ICD-10-CM | POA: Diagnosis present

## 2018-07-30 DIAGNOSIS — Z751 Person awaiting admission to adequate facility elsewhere: Secondary | ICD-10-CM | POA: Diagnosis not present

## 2018-07-30 DIAGNOSIS — I252 Old myocardial infarction: Secondary | ICD-10-CM

## 2018-07-30 DIAGNOSIS — D62 Acute posthemorrhagic anemia: Secondary | ICD-10-CM | POA: Diagnosis not present

## 2018-07-30 DIAGNOSIS — T148XXA Other injury of unspecified body region, initial encounter: Secondary | ICD-10-CM

## 2018-07-30 DIAGNOSIS — E876 Hypokalemia: Secondary | ICD-10-CM | POA: Diagnosis not present

## 2018-07-30 DIAGNOSIS — E039 Hypothyroidism, unspecified: Secondary | ICD-10-CM | POA: Diagnosis present

## 2018-07-30 DIAGNOSIS — M069 Rheumatoid arthritis, unspecified: Secondary | ICD-10-CM | POA: Diagnosis present

## 2018-07-30 DIAGNOSIS — W1830XA Fall on same level, unspecified, initial encounter: Secondary | ICD-10-CM | POA: Diagnosis not present

## 2018-07-30 DIAGNOSIS — E782 Mixed hyperlipidemia: Secondary | ICD-10-CM | POA: Diagnosis present

## 2018-07-30 DIAGNOSIS — I2510 Atherosclerotic heart disease of native coronary artery without angina pectoris: Secondary | ICD-10-CM | POA: Diagnosis present

## 2018-07-30 DIAGNOSIS — Z96651 Presence of right artificial knee joint: Secondary | ICD-10-CM | POA: Diagnosis not present

## 2018-07-30 DIAGNOSIS — I10 Essential (primary) hypertension: Secondary | ICD-10-CM | POA: Diagnosis not present

## 2018-07-30 DIAGNOSIS — F0390 Unspecified dementia without behavioral disturbance: Secondary | ICD-10-CM | POA: Diagnosis not present

## 2018-07-30 DIAGNOSIS — K219 Gastro-esophageal reflux disease without esophagitis: Secondary | ICD-10-CM | POA: Diagnosis not present

## 2018-07-30 DIAGNOSIS — Z89431 Acquired absence of right foot: Secondary | ICD-10-CM | POA: Diagnosis not present

## 2018-07-30 DIAGNOSIS — M25552 Pain in left hip: Secondary | ICD-10-CM | POA: Diagnosis present

## 2018-07-30 DIAGNOSIS — Z419 Encounter for procedure for purposes other than remedying health state, unspecified: Secondary | ICD-10-CM

## 2018-07-30 DIAGNOSIS — Z66 Do not resuscitate: Secondary | ICD-10-CM | POA: Diagnosis present

## 2018-07-30 DIAGNOSIS — S72002A Fracture of unspecified part of neck of left femur, initial encounter for closed fracture: Secondary | ICD-10-CM

## 2018-07-30 DIAGNOSIS — I25119 Atherosclerotic heart disease of native coronary artery with unspecified angina pectoris: Secondary | ICD-10-CM | POA: Diagnosis not present

## 2018-07-30 DIAGNOSIS — Z79899 Other long term (current) drug therapy: Secondary | ICD-10-CM | POA: Diagnosis not present

## 2018-07-30 LAB — COMPREHENSIVE METABOLIC PANEL
ALT: 16 U/L (ref 0–44)
AST: 19 U/L (ref 15–41)
Albumin: 3.7 g/dL (ref 3.5–5.0)
Alkaline Phosphatase: 94 U/L (ref 38–126)
Anion gap: 8 (ref 5–15)
BUN: 21 mg/dL (ref 8–23)
CHLORIDE: 108 mmol/L (ref 98–111)
CO2: 23 mmol/L (ref 22–32)
CREATININE: 0.69 mg/dL (ref 0.44–1.00)
Calcium: 8.6 mg/dL — ABNORMAL LOW (ref 8.9–10.3)
GFR calc Af Amer: >60 mL/min (ref >60)
GFR calc non Af Amer: >60 mL/min (ref >60)
Glucose, Bld: 124 mg/dL — ABNORMAL HIGH (ref 70–99)
Potassium: 3.8 mmol/L (ref 3.5–5.1)
SODIUM: 139 mmol/L (ref 135–145)
Total Bilirubin: 0.8 mg/dL (ref 0.3–1.2)
Total Protein: 6.7 g/dL (ref 6.5–8.1)

## 2018-07-30 LAB — CBC WITH DIFFERENTIAL/PLATELET
ABS IMMATURE GRANULOCYTES: 0.07 10*3/uL (ref 0.00–0.07)
BASOS PCT: 0 %
Basophils Absolute: 0 10*3/uL (ref 0.0–0.1)
Eosinophils Absolute: 0 10*3/uL (ref 0.0–0.5)
Eosinophils Relative: 0 %
HCT: 35.8 % — ABNORMAL LOW (ref 36.0–46.0)
Hemoglobin: 10.8 g/dL — ABNORMAL LOW (ref 12.0–15.0)
Immature Granulocytes: 1 %
Lymphocytes Relative: 10 %
Lymphs Abs: 1.2 10*3/uL (ref 0.7–4.0)
MCH: 29.1 pg (ref 26.0–34.0)
MCHC: 30.2 g/dL (ref 30.0–36.0)
MCV: 96.5 fL (ref 80.0–100.0)
MONOS PCT: 6 %
Monocytes Absolute: 0.7 10*3/uL (ref 0.1–1.0)
NEUTROS PCT: 83 %
Neutro Abs: 10.3 10*3/uL — ABNORMAL HIGH (ref 1.7–7.7)
Platelets: 231 10*3/uL (ref 150–400)
RBC: 3.71 MIL/uL — ABNORMAL LOW (ref 3.87–5.11)
RDW: 14.3 % (ref 11.5–15.5)
WBC: 12.3 10*3/uL — ABNORMAL HIGH (ref 4.0–10.5)
nRBC: 0 % (ref 0.0–0.2)

## 2018-07-30 MED ORDER — FENTANYL CITRATE (PF) 100 MCG/2ML IJ SOLN
25.0000 ug | Freq: Once | INTRAMUSCULAR | Status: AC
  Administered 2018-07-30: 25 ug via INTRAVENOUS
  Filled 2018-07-30: qty 2

## 2018-07-30 NOTE — ED Notes (Signed)
In ct/xray

## 2018-07-30 NOTE — ED Triage Notes (Signed)
Pt comes to ed via ems, fall tonight at [facility], baseline dementia not verbal per ems.  Ems 20 in left hand, 50 mcg fentyle given. Pt could not clear C- Spine with EMS so collar on neck, pt hip ( rotation ) left side.  Pt at baseline dementia, DNR. V/s on arrival 142/84, hr 64, rr16, spo2 95 percent, cbg 112. Unwitnessed fall.

## 2018-07-30 NOTE — ED Notes (Signed)
Bed: WA02 Expected date:  Expected time:  Means of arrival:  Comments: Fall, lt hip pain, external rotation

## 2018-07-30 NOTE — ED Provider Notes (Signed)
[facility]-EMERGENCY DEPT Provider Note   CSN: [id number] Arrival date & time: 07/30/18  2037    History   Chief Complaint Chief Complaint  Patient presents with  . Fall  . Hip Pain    HPI Michelle [name] is a 85 y.o. female.     HPI   85 year old female with history of coronary artery disease, progressive aphasia, dementia, diabetes, hypertension, hyperlipidemia, presents with concern for mechanical fall with left hip pain.  Son reports that she had had a fall last Saturday, and they had begun giving her a walker to use, reports that she had another fall tonight.  He reports is difficult to discuss history with her given her progressive aphasia as well as dementia, however she normally responds appropriately to areas of pain.    Denies acute medical concerns, no vomiting, no diarrhea, no fevers or cough.  She is mentally at baseline.  She is unable to provide history, however son is at bedside.  Level V Caveat  Past Medical History:  Diagnosis Date  . Angina pectoris (HCC)    intrascapular, abnormal cardiolote preop TKR  . Arthritis   . ASCVD (arteriosclerotic cardiovascular disease)    S/P NSTEMI with LCX stent 2008, cath on 06/29/2008 revealed widely patent circumflex  . Bacterial infection 2016  . Coronary artery disease   . Diabetes mellitus   . GERD (gastroesophageal reflux disease)   . Heart attack (HCC) 2010  . Hyperlipidemia   . Hypertension   . Hypothyroidism   . Myocardial infarction [facility]) [id number]   with stents  . Rheumatoid arthritis (HCC) 2013  . Trigger finger 02/2013  . Vertigo 2016    Patient Active Problem List   Diagnosis Date Noted  . Closed intertrochanteric fracture, left, initial encounter (HCC) 07/30/2018  . Hypothermia 05/31/2018  . Dementia without behavioral disturbance (HCC) 05/31/2018  . Elevated troponin 05/31/2018  . Osteoarthritis 06/26/2017  . Primary progressive aphasia (HCC) 07/30/2015  . Mixed  hyperlipidemia 05/04/2013  . Hypertension   . Coronary artery disease   . Hypothyroidism   . GERD (gastroesophageal reflux disease)   . Arthritis   . Myocardial infarction (HCC)   . Osteoarthritis of right knee 02/26/2012    Past Surgical History:  Procedure Laterality Date  . ABDOMINAL HYSTERECTOMY  1969  . BREAST BIOPSY  2005   right -benign  . BREAST SURGERY  2004   right -cancerous-with radiation  . broken finger  2006  . CORONARY ANGIOPLASTY  2008, 2010  . foot ampuation  12/2009   right  . HAMMER TOE SURGERY  06/2009   right  . HERNIA REPAIR  [id number]  . KNEE ARTHROSCOPY  1980 ,1994   bilateral  . TOTAL GASTRECTOMY  1983   with vagotomy  . TOTAL KNEE ARTHROPLASTY  02/26/2012   Procedure: TOTAL KNEE ARTHROPLASTY;  Surgeon: Jacki Cones, MD;  Location: [facility] ORS;  Service: Orthopedics;  Laterality: Right;     OB History   No obstetric history on file.      Home Medications    Prior to Admission medications   Medication Sig Start Date End Date Taking? Authorizing Provider  acetaminophen (TYLENOL) 325 MG tablet Take 650 mg by mouth every 6 (six) hours as needed for moderate pain.   Yes [provider]  busPIRone (BUSPAR) 15 MG tablet Take 15 mg by mouth 3 (three) times daily. 07/30/18  Yes [provider]  sertraline (ZOLOFT) 100 MG tablet Take 100 mg by mouth  daily. 07/27/18  Yes [provider]    Family History Family History  Problem Relation Age of Onset  . Dementia Neg Hx     Social History Social History   Tobacco Use  . Smoking status: Never Smoker  . Smokeless tobacco: Never Used  Substance Use Topics  . Alcohol use: No  . Drug use: No     Allergies   Patient has no known allergies.   Review of Systems Review of Systems  Unable to perform ROS: Dementia     Physical Exam Updated Vital Signs BP 130/67 (BP Location: Right Arm)   Pulse 67   Temp 97.9 F (36.6 C) (Oral)   Resp 16   SpO2 97%   Physical  Exam Vitals signs and nursing note reviewed.  Constitutional:      General: She is not in acute distress.    Appearance: She is well-developed. She is not diaphoretic.  HENT:     Head: Normocephalic and atraumatic.  Eyes:     Conjunctiva/sclera: Conjunctivae normal.  Neck:     Musculoskeletal: Normal range of motion.  Cardiovascular:     Rate and Rhythm: Normal rate and regular rhythm.     Heart sounds: Normal heart sounds. No murmur. No friction rub. No gallop.   Pulmonary:     Effort: Pulmonary effort is normal. No respiratory distress.     Breath sounds: Normal breath sounds. No wheezing or rales.  Abdominal:     General: There is no distension.     Palpations: Abdomen is soft.     Tenderness: There is no abdominal tenderness. There is no guarding.  Musculoskeletal:     Left hip: She exhibits decreased range of motion and tenderness. Deformity: shortened and externally rotated.     Cervical back: She exhibits no bony tenderness.  Skin:    General: Skin is warm and dry.     Findings: No erythema or rash.  Neurological:     Mental Status: She is alert.     Comments: Alert, expressive aphasia Appears to have normal sensation in lower extremities      ED Treatments / Results  Labs (all labs ordered are listed, but only abnormal results are displayed) Labs Reviewed  CBC WITH DIFFERENTIAL/PLATELET - Abnormal; Notable for the following components:      Result Value   WBC 12.3 (*)    RBC 3.71 (*)    Hemoglobin 10.8 (*)    HCT 35.8 (*)    Neutro Abs 10.3 (*)    All other components within normal limits  COMPREHENSIVE METABOLIC PANEL - Abnormal; Notable for the following components:   Glucose, Bld 124 (*)    Calcium 8.6 (*)    All other components within normal limits  TYPE AND SCREEN    EKG None  Radiology Dg Chest 1 View  Result Date: 07/30/2018 CLINICAL DATA:  Preop after fall.  Baseline dementia. EXAM: CHEST  1 VIEW COMPARISON:  05/31/2018 FINDINGS: The lung  volumes are low similar in appearance to prior. There is stable cardiomegaly with aortic atherosclerosis. Central pulmonary vascular congestion and mild interstitial edema is noted though less prominent than on prior. No apparent pulmonary consolidation to suggest pneumonia. No effusion or pneumothorax. No acute osseous abnormality. IMPRESSION: Low lung volumes with mild interstitial edema. Stable cardiomegaly with aortic atherosclerosis. Electronically Signed   By: Tollie Ethavid  Kwon M.D.   On: 07/30/2018 21:42   Ct Head Wo Contrast  Result Date: 07/30/2018 CLINICAL DATA:  Dementia  patient post unwitnessed fall. Head trauma, ataxia; C-spine trauma, high clinical risk (NEXUS/CCR) EXAM: CT HEAD WITHOUT CONTRAST CT CERVICAL SPINE WITHOUT CONTRAST TECHNIQUE: Multidetector CT imaging of the head and cervical spine was performed following the standard protocol without intravenous contrast. Multiplanar CT image reconstructions of the cervical spine were also generated. COMPARISON:  Head CT 05/31/2018, head and cervical spine CT 11/30/2017 FINDINGS: CT HEAD FINDINGS Brain: Unchanged atrophy and chronic small vessel ischemia. No intracranial hemorrhage, mass effect, or midline shift. No hydrocephalus. The basilar cisterns are patent. No evidence of territorial infarct or acute ischemia. No extra-axial or intracranial fluid collection. Vascular: Atherosclerosis of skullbase vasculature without hyperdense vessel or abnormal calcification. Skull: No fracture or focal lesion. Sinuses/Orbits: Paranasal sinuses and mastoid air cells are clear. The visualized orbits are unremarkable. Other: None. CT CERVICAL SPINE FINDINGS Alignment: No traumatic subluxation. Trace anterolisthesis of C7 on T1 is unchanged from prior exam and likely degenerative. Skull base and vertebrae: No acute fracture. Vertebral body heights are maintained. The dens and skull base are intact. Soft tissues and spinal canal: No prevertebral fluid or swelling. No  visible canal hematoma. Disc levels: Diffuse disc space narrowing and endplate spurring. Scattered facet arthropathy. Degenerative changes are similar to prior exam. Upper chest: Patulous upper esophagus. Other: None. IMPRESSION: 1. No acute intracranial abnormality. No skull fracture. Stable atrophy and chronic small vessel ischemia. 2. Multilevel degenerative change in the cervical spine without acute fracture or subluxation. Electronically Signed   By: Narda RutherfordMelanie  Sanford M.D.   On: 07/30/2018 22:06   Ct Cervical Spine Wo Contrast  Result Date: 07/30/2018 CLINICAL DATA:  Dementia patient post unwitnessed fall. Head trauma, ataxia; C-spine trauma, high clinical risk (NEXUS/CCR) EXAM: CT HEAD WITHOUT CONTRAST CT CERVICAL SPINE WITHOUT CONTRAST TECHNIQUE: Multidetector CT imaging of the head and cervical spine was performed following the standard protocol without intravenous contrast. Multiplanar CT image reconstructions of the cervical spine were also generated. COMPARISON:  Head CT 05/31/2018, head and cervical spine CT 11/30/2017 FINDINGS: CT HEAD FINDINGS Brain: Unchanged atrophy and chronic small vessel ischemia. No intracranial hemorrhage, mass effect, or midline shift. No hydrocephalus. The basilar cisterns are patent. No evidence of territorial infarct or acute ischemia. No extra-axial or intracranial fluid collection. Vascular: Atherosclerosis of skullbase vasculature without hyperdense vessel or abnormal calcification. Skull: No fracture or focal lesion. Sinuses/Orbits: Paranasal sinuses and mastoid air cells are clear. The visualized orbits are unremarkable. Other: None. CT CERVICAL SPINE FINDINGS Alignment: No traumatic subluxation. Trace anterolisthesis of C7 on T1 is unchanged from prior exam and likely degenerative. Skull base and vertebrae: No acute fracture. Vertebral body heights are maintained. The dens and skull base are intact. Soft tissues and spinal canal: No prevertebral fluid or swelling. No  visible canal hematoma. Disc levels: Diffuse disc space narrowing and endplate spurring. Scattered facet arthropathy. Degenerative changes are similar to prior exam. Upper chest: Patulous upper esophagus. Other: None. IMPRESSION: 1. No acute intracranial abnormality. No skull fracture. Stable atrophy and chronic small vessel ischemia. 2. Multilevel degenerative change in the cervical spine without acute fracture or subluxation. Electronically Signed   By: Narda RutherfordMelanie  Sanford M.D.   On: 07/30/2018 22:06   Dg Hip Unilat W Or Wo Pelvis 2-3 Views Left  Result Date: 07/30/2018 CLINICAL DATA:  Preop left hip fracture EXAM: DG HIP (WITH OR WITHOUT PELVIS) 2-3V LEFT COMPARISON:  None. FINDINGS: Acute, closed, intertrochanteric fracture of the left hip is noted. No joint dislocation is identified. The fracture also appears undermining the greater trochanter.  No pelvic fracture. 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 Intact right hip and femur. Lower lumbar degenerative disc and facet arthropathy is seen from L4 through S1. Pelvic phleboliths are present. IMPRESSION: Acute, closed, intertrochanteric fracture of the left femur with fracture also undermining the greater trochanter. No significant angulation and slight displacement is seen. Electronically Signed   By: Tollie Eth M.D.   On: 07/30/2018 21:46    Procedures Procedures (including critical care time)  Medications Ordered in ED Medications  fentaNYL (SUBLIMAZE) injection 25 mcg (25 mcg Intravenous Given 07/30/18 2224)     Initial Impression / Assessment and Plan / ED Course  I have reviewed the triage vital signs and the nursing notes.  Pertinent labs & imaging results that were available during my care of the patient were reviewed by me and considered in my medical decision making (see chart for details).        85 year old female with history of coronary artery disease, progressive aphasia, dementia, diabetes, hypertension,  hyperlipidemia, presents with concern for mechanical fall with left hip pain.  Reportedly mechanical fall and no history of acute medical concerns at this time.   CT head, CS without acute abnormality. XR chest without acute abnormality.    XR left hip with intertrochanteric fracture. Consulted Dr. Susa Simmonds who plans on surgery tomorrow AM at [facility].  Of note, brief episode lasting seconds of suspected atrial fibrillation noted on telemetry, converted to sinus, will continue to monitor. No previous known atrial fibrillation. Pt to be admitted to hospitalist for further care.  Final Clinical Impressions(s) / ED Diagnoses   Final diagnoses:  Closed fracture of left hip, initial encounter [facility])    ED Discharge Orders    None       Alvira Monday, MD 07/31/18 [phone]

## 2018-07-30 NOTE — H&P (Signed)
History and Physical   Michelle [name] ZLD:[id number] DOB: 1927-07-24 DOA: 07/30/2018  Referring MD/NP/PA: Alvira Monday, MD  PCP: Michelle Ok, MD   Patient coming from: [facility]  Chief Complaint: Fall and left femural Fracture  HPI: [name] is a 85 y.o. female with medical history significant of dementia, progressive aphasia, coronary artery disease, hypertension,Hyperlipidemia, hypothyroidism brought in from SNF after sustaining a fall last Saturday and another one tonight. History obtained from Son due to Dementia. Patient having pain in her left hip which is persistent. Not able to walk without pain. Was using a walker since last Saturday but not improved. She was seen and evaluated in the ER and found to have left Intertrochanteric fracture with no significant angulation.  Orthopedic consulted and recommends surgery probably as early as tomorrow.  She is to be admitted to the medical service and Ortho will consult..  ED Course: Vitals stable, WBC 12.3, Hemoglobin 10.8, Calcium 8.6, other chemistry and CBCs are within normal. CT head and Cervical spine shows no injury.  CXR shows interstitial edema. X ray of left hip shows Acute, closed, intertrochanteric fracture of the left femur.   Review of Systems: As per HPI otherwise 10 point review of systems negative.    Past Medical History:  Diagnosis Date  . Angina pectoris (HCC)    intrascapular, abnormal cardiolote preop TKR  . Arthritis   . ASCVD (arteriosclerotic cardiovascular disease)    S/P NSTEMI with LCX stent 2008, cath on 06/29/2008 revealed widely patent circumflex  . Bacterial infection 2016  . Coronary artery disease   . Diabetes mellitus   . GERD (gastroesophageal reflux disease)   . Heart attack (HCC) 2010  . Hyperlipidemia   . Hypertension   . Hypothyroidism   . Myocardial infarction [facility]) [id number]   with stents  . Rheumatoid arthritis (HCC) 2013  . Trigger finger 02/2013  . Vertigo 2016    Past  Surgical History:  Procedure Laterality Date  . ABDOMINAL HYSTERECTOMY  1969  . BREAST BIOPSY  2005   right -benign  . BREAST SURGERY  2004   right -cancerous-with radiation  . broken finger  2006  . CORONARY ANGIOPLASTY  2008, 2010  . foot ampuation  12/2009   right  . HAMMER TOE SURGERY  06/2009   right  . HERNIA REPAIR  [id number]  . KNEE ARTHROSCOPY  1980 ,1994   bilateral  . TOTAL GASTRECTOMY  1983   with vagotomy  . TOTAL KNEE ARTHROPLASTY  02/26/2012   Procedure: TOTAL KNEE ARTHROPLASTY;  Surgeon: Michelle Cones, MD;  Location: [facility];  Service: Orthopedics;  Laterality: Right;     reports that she has never smoked. She has never used smokeless tobacco. She reports that she does not drink alcohol or use drugs.  No Known Allergies  Family History  Problem Relation Age of Onset  . Dementia Neg Hx      Prior to Admission medications   Medication Sig Start Date End Date Taking? Authorizing Provider  acetaminophen (TYLENOL) 325 MG tablet Take 650 mg by mouth every 6 (six) hours as needed for moderate pain.   Yes [provider]  busPIRone (BUSPAR) 15 MG tablet Take 15 mg by mouth 3 (three) times daily. 07/30/18  Yes [provider]  sertraline (ZOLOFT) 100 MG tablet Take 100 mg by mouth daily. 07/27/18  Yes [provider]    Physical Exam: Vitals:   07/30/18 2056 07/30/18 2225  BP: (!) 145/68 136/84  Pulse: 61 62  Resp: 17 20  Temp: 97.6 F (36.4 C) 97.6 F (36.4 C)  TempSrc: Oral Oral  SpO2: 93% 94%      Constitutional: NAD, calm, comfortable, Confused Vitals:   07/30/18 2056 07/30/18 2225  BP: (!) 145/68 136/84  Pulse: 61 62  Resp: 17 20  Temp: 97.6 F (36.4 C) 97.6 F (36.4 C)  TempSrc: Oral Oral  SpO2: 93% 94%   Eyes: PERRL, lids and conjunctivae normal ENMT: Mucous membranes are moist. Posterior pharynx clear of any exudate or lesions.Normal dentition.  Neck: normal, supple, no masses, no thyromegaly Respiratory:  clear to auscultation bilaterally, no wheezing, no crackles. Normal respiratory effort. No accessory muscle use.  Cardiovascular: Regular rate and rhythm, no murmurs / rubs / gallops. No extremity edema. 2+ pedal pulses. No carotid bruits.  Abdomen: no tenderness, no masses palpated. No hepatosplenomegaly. Bowel sounds positive.  Musculoskeletal: no clubbing / cyanosis. LLE joint deformity, normal upper and lower extremities. Good ROM, no contractures. Normal muscle tone.  Skin: no rashes, lesions, ulcers. No induration Neurologic: CN 2-12 grossly intact. Sensation intact, DTR normal. Strength 5/5 in all 4.  Psychiatric: Dementia, Normal mood.     Labs on Admission: I have personally reviewed following labs and imaging studies  CBC: Recent Labs  Lab 07/30/18 2200  WBC 12.3*  NEUTROABS 10.3*  HGB 10.8*  HCT 35.8*  MCV 96.5  PLT 231   Basic Metabolic Panel: Recent Labs  Lab 07/30/18 2200  NA 139  K 3.8  CL 108  CO2 23  GLUCOSE 124*  BUN 21  CREATININE 0.69  CALCIUM 8.6*   GFR: CrCl cannot be calculated (Unknown ideal weight.). Liver Function Tests: Recent Labs  Lab 07/30/18 2200  AST 19  ALT 16  ALKPHOS 94  BILITOT 0.8  PROT 6.7  ALBUMIN 3.7   No results for input(s): LIPASE, AMYLASE in the last 168 hours. No results for input(s): AMMONIA in the last 168 hours. Coagulation Profile: No results for input(s): INR, PROTIME in the last 168 hours. Cardiac Enzymes: No results for input(s): CKTOTAL, CKMB, CKMBINDEX, TROPONINI in the last 168 hours. BNP (last 3 results) No results for input(s): PROBNP in the last 8760 hours. HbA1C: No results for input(s): HGBA1C in the last 72 hours. CBG: No results for input(s): GLUCAP in the last 168 hours. Lipid Profile: No results for input(s): CHOL, HDL, LDLCALC, TRIG, CHOLHDL, LDLDIRECT in the last 72 hours. Thyroid Function Tests: No results for input(s): TSH, T4TOTAL, FREET4, T3FREE, THYROIDAB in the last 72  hours. Anemia Panel: No results for input(s): VITAMINB12, FOLATE, FERRITIN, TIBC, IRON, RETICCTPCT in the last 72 hours. Urine analysis:    Component Value Date/Time   COLORURINE YELLOW 11/30/2017 1420   APPEARANCEUR CLEAR 11/30/2017 1420   LABSPEC 1.028 11/30/2017 1420   PHURINE 5.0 11/30/2017 1420   GLUCOSEU NEGATIVE 11/30/2017 1420   HGBUR NEGATIVE 11/30/2017 1420   BILIRUBINUR NEGATIVE 11/30/2017 1420   KETONESUR 5 (A) 11/30/2017 1420   PROTEINUR NEGATIVE 11/30/2017 1420   UROBILINOGEN 1.0 03/12/2012 1739   NITRITE NEGATIVE 11/30/2017 1420   LEUKOCYTESUR NEGATIVE 11/30/2017 1420   Sepsis Labs: @LABRCNTIP (procalcitonin:4,lacticidven:4) )No results found for this or any previous visit (from the past 240 hour(s)).   Radiological Exams on Admission: Dg Chest 1 View  Result Date: 07/30/2018 CLINICAL DATA:  Preop after fall.  Baseline dementia. EXAM: CHEST  1 VIEW COMPARISON:  05/31/2018 FINDINGS: The lung volumes are low similar in appearance to prior. There is stable cardiomegaly  with aortic atherosclerosis. Central pulmonary vascular congestion and mild interstitial edema is noted though less prominent than on prior. No apparent pulmonary consolidation to suggest pneumonia. No effusion or pneumothorax. No acute osseous abnormality. IMPRESSION: Low lung volumes with mild interstitial edema. Stable cardiomegaly with aortic atherosclerosis. Electronically Signed   By: Tollie Ethavid  Kwon M.D.   On: 07/30/2018 21:42   Ct Head Wo Contrast  Result Date: 07/30/2018 CLINICAL DATA:  Dementia patient post unwitnessed fall. Head trauma, ataxia; C-spine trauma, high clinical risk (NEXUS/CCR) EXAM: CT HEAD WITHOUT CONTRAST CT CERVICAL SPINE WITHOUT CONTRAST TECHNIQUE: Multidetector CT imaging of the head and cervical spine was performed following the standard protocol without intravenous contrast. Multiplanar CT image reconstructions of the cervical spine were also generated. COMPARISON:  Head CT 05/31/2018,  head and cervical spine CT 11/30/2017 FINDINGS: CT HEAD FINDINGS Brain: Unchanged atrophy and chronic small vessel ischemia. No intracranial hemorrhage, mass effect, or midline shift. No hydrocephalus. The basilar cisterns are patent. No evidence of territorial infarct or acute ischemia. No extra-axial or intracranial fluid collection. Vascular: Atherosclerosis of skullbase vasculature without hyperdense vessel or abnormal calcification. Skull: No fracture or focal lesion. Sinuses/Orbits: Paranasal sinuses and mastoid air cells are clear. The visualized orbits are unremarkable. Other: None. CT CERVICAL SPINE FINDINGS Alignment: No traumatic subluxation. Trace anterolisthesis of C7 on T1 is unchanged from prior exam and likely degenerative. Skull base and vertebrae: No acute fracture. Vertebral body heights are maintained. The dens and skull base are intact. Soft tissues and spinal canal: No prevertebral fluid or swelling. No visible canal hematoma. Disc levels: Diffuse disc space narrowing and endplate spurring. Scattered facet arthropathy. Degenerative changes are similar to prior exam. Upper chest: Patulous upper esophagus. Other: None. IMPRESSION: 1. No acute intracranial abnormality. No skull fracture. Stable atrophy and chronic small vessel ischemia. 2. Multilevel degenerative change in the cervical spine without acute fracture or subluxation. Electronically Signed   By: Narda RutherfordMelanie  Sanford M.D.   On: 07/30/2018 22:06   Ct Cervical Spine Wo Contrast  Result Date: 07/30/2018 CLINICAL DATA:  Dementia patient post unwitnessed fall. Head trauma, ataxia; C-spine trauma, high clinical risk (NEXUS/CCR) EXAM: CT HEAD WITHOUT CONTRAST CT CERVICAL SPINE WITHOUT CONTRAST TECHNIQUE: Multidetector CT imaging of the head and cervical spine was performed following the standard protocol without intravenous contrast. Multiplanar CT image reconstructions of the cervical spine were also generated. COMPARISON:  Head CT 05/31/2018,  head and cervical spine CT 11/30/2017 FINDINGS: CT HEAD FINDINGS Brain: Unchanged atrophy and chronic small vessel ischemia. No intracranial hemorrhage, mass effect, or midline shift. No hydrocephalus. The basilar cisterns are patent. No evidence of territorial infarct or acute ischemia. No extra-axial or intracranial fluid collection. Vascular: Atherosclerosis of skullbase vasculature without hyperdense vessel or abnormal calcification. Skull: No fracture or focal lesion. Sinuses/Orbits: Paranasal sinuses and mastoid air cells are clear. The visualized orbits are unremarkable. Other: None. CT CERVICAL SPINE FINDINGS Alignment: No traumatic subluxation. Trace anterolisthesis of C7 on T1 is unchanged from prior exam and likely degenerative. Skull base and vertebrae: No acute fracture. Vertebral body heights are maintained. The dens and skull base are intact. Soft tissues and spinal canal: No prevertebral fluid or swelling. No visible canal hematoma. Disc levels: Diffuse disc space narrowing and endplate spurring. Scattered facet arthropathy. Degenerative changes are similar to prior exam. Upper chest: Patulous upper esophagus. Other: None. IMPRESSION: 1. No acute intracranial abnormality. No skull fracture. Stable atrophy and chronic small vessel ischemia. 2. Multilevel degenerative change in the cervical spine without acute fracture  or subluxation. Electronically Signed   By: Narda Rutherford M.D.   On: 07/30/2018 22:06   Dg Hip Unilat W Or Wo Pelvis 2-3 Views Left  Result Date: 07/30/2018 CLINICAL DATA:  Preop left hip fracture EXAM: DG HIP (WITH OR WITHOUT PELVIS) 2-3V LEFT COMPARISON:  None. FINDINGS: Acute, closed, intertrochanteric fracture of the left hip is noted. No joint dislocation is identified. The fracture also appears undermining the greater trochanter. No pelvic fracture. 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 Intact right hip and femur. Lower lumbar degenerative disc and  facet arthropathy is seen from L4 through S1. Pelvic phleboliths are present. IMPRESSION: Acute, closed, intertrochanteric fracture of the left femur with fracture also undermining the greater trochanter. No significant angulation and slight displacement is seen. Electronically Signed   By: Tollie Eth M.D.   On: 07/30/2018 21:46    EKG: Independently reviewed. Not yet done.  Assessment/Plan Principal Problem:   Closed intertrochanteric fracture, left, initial encounter [facility]) Active Problems:   Hypertension   Coronary artery disease   Hypothyroidism   GERD (gastroesophageal reflux disease)   Mixed hyperlipidemia   Dementia without behavioral disturbance (HCC)     #1. Left Intertrochanteric Fracture: Closed, following a fall. Patient will need surgery. Dr Susa Simmonds of Orthopedics planning for surgery tomorrow. Will admit to [facility] with pain control. Had Echo in January that was normal EF. Has multiple medical problems which are stable. She is hemodynamically stable and can be cleared for surgery. Keep NPO after midnight.  #2. CAD: Stable. No recent decompensation.  #3. Hypertension: Controlled. Continue home medications.  #4. Hypothyroidsm: Continue Levothyroid.   #5. GERD: PPI  #6. Mixed Hyperlipidemia: Continue Statin  #7. Dementia: Stable at baseline.   DVT prophylaxis: SCD  Code Status: DNR   Family Communication: Son at  bedside  Disposition Plan: Back to SNF  Consults called: Dr Susa Simmonds, Orthopedics  Admission status: Inpatient   Severity of Illness: The appropriate patient status for this patient is INPATIENT. Inpatient status is judged to be reasonable and necessary in order to provide the required intensity of service to ensure the patient's safety. The patient's presenting symptoms, physical exam findings, and initial radiographic and laboratory data in the context of their chronic comorbidities is felt to place them at high risk for further clinical  deterioration. Furthermore, it is not anticipated that the patient will be medically stable for discharge from the hospital within 2 midnights of admission. The following factors support the patient status of inpatient.   " The patient's presenting symptoms include Fall with left hip pain. " The worrisome physical exam findings include Angulation and mild deformity to LLE. " The initial radiographic and laboratory data are worrisome because of X ray showing Intertrochanteric fracture. " The chronic co-morbidities include Dementia.   * I certify that at the point of admission it is my clinical judgment that the patient will require inpatient hospital care spanning beyond 2 midnights from the point of admission due to high intensity of service, high risk for further deterioration and high frequency of surveillance required.Lonia Blood MD [facility] Pager [phone]  If 7PM-7AM, please contact night-coverage [email or web address] Password [facility]  07/30/2018, 11:44 PM

## 2018-07-31 ENCOUNTER — Encounter (HOSPITAL_COMMUNITY)
Admission: EM | Disposition: A | Discharge status: Skilled Nursing Facility | Payer: Self-pay | Source: Skilled Nursing Facility | Attending: Family Medicine

## 2018-07-31 ENCOUNTER — Inpatient Hospital Stay (HOSPITAL_COMMUNITY): Payer: Medicare Other | Admitting: Certified Registered"

## 2018-07-31 ENCOUNTER — Encounter (HOSPITAL_COMMUNITY): Payer: Self-pay | Admitting: Orthopedic Surgery

## 2018-07-31 ENCOUNTER — Inpatient Hospital Stay (HOSPITAL_COMMUNITY): Payer: Medicare Other

## 2018-07-31 HISTORY — PX: INTRAMEDULLARY (IM) NAIL INTERTROCHANTERIC: SHX5875

## 2018-07-31 LAB — ABO/RH: ABO/RH(D): A POS

## 2018-07-31 LAB — TYPE AND SCREEN
ABO/RH(D): A POS
ABO/RH(D): A POS
ANTIBODY SCREEN: NEGATIVE
Antibody Screen: NEGATIVE

## 2018-07-31 LAB — SURGICAL PCR SCREEN
MRSA, PCR: NEGATIVE
STAPHYLOCOCCUS AUREUS: NEGATIVE

## 2018-07-31 LAB — GLUCOSE, CAPILLARY: Glucose-Capillary: 151 mg/dL — ABNORMAL HIGH (ref 70–99)

## 2018-07-31 LAB — MRSA PCR SCREENING: MRSA by PCR: NEGATIVE

## 2018-07-31 SURGERY — FIXATION, FRACTURE, INTERTROCHANTERIC, WITH INTRAMEDULLARY ROD
Anesthesia: General | Site: Leg Upper | Laterality: Left

## 2018-07-31 MED ORDER — EPHEDRINE SULFATE-NACL 50-0.9 MG/10ML-% IV SOSY
PREFILLED_SYRINGE | INTRAVENOUS | Status: DC | PRN
  Administered 2018-07-31: 10 mg via INTRAVENOUS

## 2018-07-31 MED ORDER — PHENYLEPHRINE 40 MCG/ML (10ML) SYRINGE FOR IV PUSH (FOR BLOOD PRESSURE SUPPORT)
PREFILLED_SYRINGE | INTRAVENOUS | Status: DC | PRN
  Administered 2018-07-31 (×3): 80 ug via INTRAVENOUS

## 2018-07-31 MED ORDER — PROPOFOL 10 MG/ML IV BOLUS
INTRAVENOUS | Status: AC
  Filled 2018-07-31: qty 20

## 2018-07-31 MED ORDER — MORPHINE SULFATE (PF) 2 MG/ML IV SOLN
2.0000 mg | INTRAVENOUS | Status: DC | PRN
  Administered 2018-08-01 – 2018-08-02 (×4): 2 mg via INTRAVENOUS
  Filled 2018-07-31 (×4): qty 1

## 2018-07-31 MED ORDER — LACTATED RINGERS IV SOLN
INTRAVENOUS | Status: DC | PRN
  Administered 2018-07-31: 09:00:00 via INTRAVENOUS

## 2018-07-31 MED ORDER — LACTATED RINGERS IV SOLN
INTRAVENOUS | Status: DC
  Administered 2018-07-31: 09:00:00 via INTRAVENOUS

## 2018-07-31 MED ORDER — LACTATED RINGERS IV SOLN: INTRAVENOUS | Status: DC

## 2018-07-31 MED ORDER — CEFAZOLIN SODIUM-DEXTROSE 2-4 GM/100ML-% IV SOLN
2.0000 g | Freq: Three times a day (TID) | INTRAVENOUS | Status: AC
  Administered 2018-07-31 – 2018-08-01 (×3): 2 g via INTRAVENOUS
  Filled 2018-07-31 (×3): qty 100

## 2018-07-31 MED ORDER — ACETAMINOPHEN 325 MG PO TABS: 325.0000 mg | ORAL_TABLET | Freq: Once | ORAL | Status: DC

## 2018-07-31 MED ORDER — 0.9 % SODIUM CHLORIDE (POUR BTL) OPTIME
TOPICAL | Status: DC | PRN
  Administered 2018-07-31: 1000 mL

## 2018-07-31 MED ORDER — DEXAMETHASONE SODIUM PHOSPHATE 10 MG/ML IJ SOLN
INTRAMUSCULAR | Status: AC
  Filled 2018-07-31: qty 1

## 2018-07-31 MED ORDER — HYDROCODONE-ACETAMINOPHEN 5-325 MG PO TABS
1.0000 | ORAL_TABLET | Freq: Four times a day (QID) | ORAL | Status: DC | PRN
  Administered 2018-08-01: 1 via ORAL
  Administered 2018-08-02: 2 via ORAL
  Filled 2018-07-31: qty 1
  Filled 2018-07-31: qty 2

## 2018-07-31 MED ORDER — ONDANSETRON HCL 4 MG PO TABS: 4.0000 mg | ORAL_TABLET | Freq: Four times a day (QID) | ORAL | Status: DC | PRN

## 2018-07-31 MED ORDER — LIDOCAINE 2% (20 MG/ML) 5 ML SYRINGE
INTRAMUSCULAR | Status: DC | PRN
  Administered 2018-07-31: 50 mg via INTRAVENOUS

## 2018-07-31 MED ORDER — ENOXAPARIN SODIUM 40 MG/0.4ML ~~LOC~~ SOLN
40.0000 mg | SUBCUTANEOUS | Status: DC
  Administered 2018-08-01 – 2018-08-04 (×4): 40 mg via SUBCUTANEOUS
  Filled 2018-07-31 (×4): qty 0.4

## 2018-07-31 MED ORDER — ONDANSETRON HCL 4 MG/2ML IJ SOLN: 4.0000 mg | Freq: Four times a day (QID) | INTRAMUSCULAR | Status: DC | PRN

## 2018-07-31 MED ORDER — ACETAMINOPHEN 325 MG PO TABS: 650.0000 mg | ORAL_TABLET | Freq: Four times a day (QID) | ORAL | Status: DC | PRN

## 2018-07-31 MED ORDER — ACETAMINOPHEN 10 MG/ML IV SOLN: 1000.0000 mg | Freq: Once | INTRAVENOUS | Status: DC | PRN

## 2018-07-31 MED ORDER — FENTANYL CITRATE (PF) 100 MCG/2ML IJ SOLN
INTRAMUSCULAR | Status: DC | PRN
  Administered 2018-07-31 (×2): 25 ug via INTRAVENOUS

## 2018-07-31 MED ORDER — SUGAMMADEX SODIUM 200 MG/2ML IV SOLN
INTRAVENOUS | Status: DC | PRN
  Administered 2018-07-31: 200 mg via INTRAVENOUS

## 2018-07-31 MED ORDER — SODIUM CHLORIDE 0.9 % IV SOLN
INTRAVENOUS | Status: DC | PRN
  Administered 2018-07-31: 60 ug/min via INTRAVENOUS

## 2018-07-31 MED ORDER — ACETAMINOPHEN 325 MG PO TABS
650.0000 mg | ORAL_TABLET | Freq: Four times a day (QID) | ORAL | Status: DC
  Administered 2018-07-31 – 2018-08-04 (×12): 650 mg via ORAL
  Filled 2018-07-31 (×13): qty 2

## 2018-07-31 MED ORDER — PROPOFOL 10 MG/ML IV BOLUS
INTRAVENOUS | Status: DC | PRN
  Administered 2018-07-31 (×2): 50 mg via INTRAVENOUS

## 2018-07-31 MED ORDER — HEPARIN SODIUM (PORCINE) 5000 UNIT/ML IJ SOLN
5000.0000 [IU] | Freq: Three times a day (TID) | INTRAMUSCULAR | Status: DC
  Administered 2018-07-31: 5000 [IU] via SUBCUTANEOUS
  Filled 2018-07-31: qty 1

## 2018-07-31 MED ORDER — ONDANSETRON HCL 4 MG/2ML IJ SOLN
INTRAMUSCULAR | Status: AC
  Filled 2018-07-31: qty 2

## 2018-07-31 MED ORDER — FENTANYL CITRATE (PF) 250 MCG/5ML IJ SOLN
INTRAMUSCULAR | Status: AC
  Filled 2018-07-31: qty 5

## 2018-07-31 MED ORDER — DEXAMETHASONE SODIUM PHOSPHATE 10 MG/ML IJ SOLN
INTRAMUSCULAR | Status: DC | PRN
  Administered 2018-07-31: 4 mg via INTRAVENOUS

## 2018-07-31 MED ORDER — ONDANSETRON HCL 4 MG/2ML IJ SOLN
INTRAMUSCULAR | Status: DC | PRN
  Administered 2018-07-31: 4 mg via INTRAVENOUS

## 2018-07-31 MED ORDER — ROCURONIUM BROMIDE 50 MG/5ML IV SOSY
PREFILLED_SYRINGE | INTRAVENOUS | Status: AC
  Filled 2018-07-31: qty 5

## 2018-07-31 MED ORDER — ROCURONIUM BROMIDE 50 MG/5ML IV SOSY
PREFILLED_SYRINGE | INTRAVENOUS | Status: DC | PRN
  Administered 2018-07-31: 30 mg via INTRAVENOUS
  Administered 2018-07-31: 20 mg via INTRAVENOUS

## 2018-07-31 MED ORDER — MORPHINE SULFATE (PF) 2 MG/ML IV SOLN: 2.0000 mg | INTRAVENOUS | Status: DC | PRN

## 2018-07-31 MED ORDER — PHENYLEPHRINE 40 MCG/ML (10ML) SYRINGE FOR IV PUSH (FOR BLOOD PRESSURE SUPPORT)
PREFILLED_SYRINGE | INTRAVENOUS | Status: AC
  Filled 2018-07-31: qty 10

## 2018-07-31 MED ORDER — VANCOMYCIN HCL 1000 MG IV SOLR
INTRAVENOUS | Status: AC
  Filled 2018-07-31: qty 1000

## 2018-07-31 MED ORDER — EPHEDRINE 5 MG/ML INJ
INTRAVENOUS | Status: AC
  Filled 2018-07-31: qty 10

## 2018-07-31 MED ORDER — VANCOMYCIN HCL 1000 MG IV SOLR
INTRAVENOUS | Status: DC | PRN
  Administered 2018-07-31: 1000 mg

## 2018-07-31 MED ORDER — BUSPIRONE HCL 5 MG PO TABS
15.0000 mg | ORAL_TABLET | Freq: Three times a day (TID) | ORAL | Status: DC
  Administered 2018-07-31 – 2018-08-04 (×12): 15 mg via ORAL
  Filled 2018-07-31 (×13): qty 1

## 2018-07-31 MED ORDER — ACETAMINOPHEN 160 MG/5ML PO SOLN: 325.0000 mg | Freq: Once | ORAL | Status: DC

## 2018-07-31 MED ORDER — LIDOCAINE 2% (20 MG/ML) 5 ML SYRINGE
INTRAMUSCULAR | Status: AC
  Filled 2018-07-31: qty 5

## 2018-07-31 MED ORDER — SERTRALINE HCL 100 MG PO TABS
100.0000 mg | ORAL_TABLET | Freq: Every day | ORAL | Status: DC
  Administered 2018-07-31 – 2018-08-04 (×5): 100 mg via ORAL
  Filled 2018-07-31 (×5): qty 1

## 2018-07-31 MED ORDER — FENTANYL CITRATE (PF) 100 MCG/2ML IJ SOLN: 25.0000 ug | INTRAMUSCULAR | Status: DC | PRN

## 2018-07-31 MED ORDER — CEFAZOLIN SODIUM-DEXTROSE 2-4 GM/100ML-% IV SOLN
2.0000 g | INTRAVENOUS | Status: AC
  Administered 2018-07-31: 2 g via INTRAVENOUS

## 2018-07-31 MED ORDER — CEFAZOLIN SODIUM-DEXTROSE 2-4 GM/100ML-% IV SOLN
INTRAVENOUS | Status: AC
  Filled 2018-07-31: qty 100

## 2018-07-31 MED ORDER — SODIUM CHLORIDE 0.9 % IV SOLN
INTRAVENOUS | Status: DC
  Administered 2018-07-31 – 2018-08-03 (×4): via INTRAVENOUS

## 2018-07-31 SURGICAL SUPPLY — 49 items
ADH SKN CLS APL DERMABOND .7 (GAUZE/BANDAGES/DRESSINGS) ×1
BIT DRILL FLUTED FEMUR 4.2/3 (BIT) ×2 IMPLANT
BLADE HELICAL TFNA 90 HIP (Anchor) ×1 IMPLANT
BLADE HELICAL TFNA 90MM HIP (Anchor) ×1 IMPLANT
BNDG COHESIVE 4X5 TAN STRL (GAUZE/BANDAGES/DRESSINGS) ×2 IMPLANT
BRUSH SCRUB SURG 4.25 DISP (MISCELLANEOUS) ×4 IMPLANT
CHLORAPREP W/TINT 26ML (MISCELLANEOUS) ×3 IMPLANT
COVER PERINEAL POST (MISCELLANEOUS) ×3 IMPLANT
COVER SURGICAL LIGHT HANDLE (MISCELLANEOUS) ×3 IMPLANT
COVER WAND RF STERILE (DRAPES) ×3 IMPLANT
DERMABOND ADVANCED (GAUZE/BANDAGES/DRESSINGS) ×2
DERMABOND ADVANCED .7 DNX12 (GAUZE/BANDAGES/DRESSINGS) ×1 IMPLANT
DRAPE HALF SHEET 40X57 (DRAPES) ×6 IMPLANT
DRAPE IMP U-DRAPE 54X76 (DRAPES) ×9 IMPLANT
DRAPE INCISE IOBAN 66X45 STRL (DRAPES) ×3 IMPLANT
DRAPE STERI IOBAN 125X83 (DRAPES) ×3 IMPLANT
DRAPE SURG 17X23 STRL (DRAPES) ×6 IMPLANT
DRAPE U-SHAPE 47X51 STRL (DRAPES) ×5 IMPLANT
DRSG MEPILEX BORDER 4X4 (GAUZE/BANDAGES/DRESSINGS) ×3 IMPLANT
DRSG MEPILEX BORDER 4X8 (GAUZE/BANDAGES/DRESSINGS) ×3 IMPLANT
ELECT REM PT RETURN 9FT ADLT (ELECTROSURGICAL) ×3
ELECTRODE REM PT RTRN 9FT ADLT (ELECTROSURGICAL) ×1 IMPLANT
GLOVE BIO SURGEON STRL SZ 6.5 (GLOVE) ×6 IMPLANT
GLOVE BIO SURGEON STRL SZ7.5 (GLOVE) ×12 IMPLANT
GLOVE BIO SURGEONS STRL SZ 6.5 (GLOVE) ×3
GLOVE BIOGEL PI IND STRL 6.5 (GLOVE) ×1 IMPLANT
GLOVE BIOGEL PI IND STRL 7.5 (GLOVE) ×1 IMPLANT
GLOVE BIOGEL PI INDICATOR 6.5 (GLOVE) ×2
GLOVE BIOGEL PI INDICATOR 7.5 (GLOVE) ×2
GOWN STRL REUS W/ TWL LRG LVL3 (GOWN DISPOSABLE) ×1 IMPLANT
GOWN STRL REUS W/TWL LRG LVL3 (GOWN DISPOSABLE) ×9
GUIDEWIRE 3.2X400 (WIRE) ×2 IMPLANT
IMPL DEG TI CANN 11MM/130 (Orthopedic Implant) IMPLANT
IMPLANT DEG TI CANN 11MM/130 (Orthopedic Implant) ×3 IMPLANT
KIT BASIN OR (CUSTOM PROCEDURE TRAY) ×3 IMPLANT
KIT TURNOVER KIT B (KITS) ×3 IMPLANT
LINER BOOT UNIVERSAL DISP (MISCELLANEOUS) ×1 IMPLANT
MANIFOLD NEPTUNE II (INSTRUMENTS) ×3 IMPLANT
NS IRRIG 1000ML POUR BTL (IV SOLUTION) ×3 IMPLANT
PACK GENERAL/GYN (CUSTOM PROCEDURE TRAY) ×3 IMPLANT
PAD ARMBOARD 7.5X6 YLW CONV (MISCELLANEOUS) ×6 IMPLANT
SCREW LOCKING 5.0X38MM (Screw) ×2 IMPLANT
SUT MNCRL AB 3-0 PS2 18 (SUTURE) ×3 IMPLANT
SUT VIC AB 0 CT1 27 (SUTURE)
SUT VIC AB 0 CT1 27XBRD ANBCTR (SUTURE) IMPLANT
SUT VIC AB 2-0 CT1 27 (SUTURE) ×6
SUT VIC AB 2-0 CT1 TAPERPNT 27 (SUTURE) ×2 IMPLANT
TOWEL OR 17X26 10 PK STRL BLUE (TOWEL DISPOSABLE) ×6 IMPLANT
WATER STERILE IRR 1000ML POUR (IV SOLUTION) ×3 IMPLANT

## 2018-07-31 NOTE — Transfer of Care (Signed)
Immediate Anesthesia Transfer of Care Note  Patient: Michelle [name]  Procedure(s) Performed: INTRAMEDULLARY (IM) NAIL INTERTROCHANTRIC (Left Leg Upper)  Patient Location: PACU  Anesthesia Type:General  Level of Consciousness: drowsy  Airway & Oxygen Therapy: Patient Spontanous Breathing and Patient connected to face mask oxygen  Post-op Assessment: Report given to RN and Post -op Vital signs reviewed and stable  Post vital signs: Reviewed and stable  Last Vitals:  Vitals Value Taken Time  BP 123/101 07/31/2018 10:47 AM  Temp    Pulse 80 07/31/2018 10:47 AM  Resp 13 07/31/2018 10:47 AM  SpO2 100 % 07/31/2018 10:47 AM  Vitals shown include unvalidated device data.  Last Pain:  Vitals:   07/31/18 0437  TempSrc: Oral  PainSc:          Complications: No apparent anesthesia complications

## 2018-07-31 NOTE — ED Notes (Signed)
ED TO INPATIENT HANDOFF REPORT  ED Nurse Name and Phone #:  JON RN Cynda AcresWLED / Clifton CustardAARON RN MC 5N   S Name/Age/Gender Michelle FaesJoan T [name] 85 y.o. female Room/Bed: WA02/WA02  Code Status   Code Status: Prior  Home/SNF/Other NURSING FACILITY [facility]  {Patient AT BASELINE PER FACILITY DEMENTIA  Is this baseline? DEMENTIA AT BASE LINE MUTTERS WORDS AND PHASES   BUT NOTHING MORE ( PER BASELINE )   Triage Complete: Triage complete  Chief Complaint Fall; Hip Injury  Triage Note Pt comes to ed via ems, [facility] tonight [facility], baseline dementia not verbal per ems.  Ems 20 in left hand, 50 mcg fentyle given. Pt could not clear C- Spine with EMS so collar on neck, pt hip ( rotation ) left side.  Pt at baseline dementia, DNR. V/s on arrival 142/84, hr 64, rr16, spo2 95 percent, cbg 112. Unwitnessed fall.    Allergies No Known Allergies  Level of Care/Admitting Diagnosis ED Disposition    ED Disposition Condition Comment   Admit  [facility] [[id number]]  Level of Care: Medical Telemetry [104]  Diagnosis: Closed intertrochanteric fracture, left, initial encounter [facility](HCC) [[id number]][[id number]]  Admitting Physician: Rometta EmeryGARBA, MOHAMMAD L [[id number]]  Attending Physician: Rometta EmeryGARBA, MOHAMMAD L [[id number]]  Estimated length of stay: past midnight tomorrow  Certification:: I certify this patient will need inpatient services for at least 2 midnights  PT Class (Do Not Modify): Inpatient [101]  PT Acc Code (Do Not Modify): Private [1]       B Medical/Surgery History Past Medical History:  Diagnosis Date  . Angina pectoris (HCC)    intrascapular, abnormal cardiolote preop TKR  . Arthritis   . ASCVD (arteriosclerotic cardiovascular disease)    S/P NSTEMI with LCX stent 2008, cath on 06/29/2008 revealed widely patent circumflex  . Bacterial infection 2016  . Coronary artery disease   . Diabetes mellitus   . GERD (gastroesophageal reflux disease)   . Heart attack (HCC) 2010  .  Hyperlipidemia   . Hypertension   . Hypothyroidism   . Myocardial infarction [facility](HCC) [id number],2010   with stents  . Rheumatoid arthritis (HCC) 2013  . Trigger finger 02/2013  . Vertigo 2016   Past Surgical History:  Procedure Laterality Date  . ABDOMINAL HYSTERECTOMY  1969  . BREAST BIOPSY  2005   right -benign  . BREAST SURGERY  2004   right -cancerous-with radiation  . broken finger  2006  . CORONARY ANGIOPLASTY  2008, 2010  . foot ampuation  12/2009   right  . HAMMER TOE SURGERY  06/2009   right  . HERNIA REPAIR  [id number]  . KNEE ARTHROSCOPY  1980 ,1994   bilateral  . TOTAL GASTRECTOMY  1983   with vagotomy  . TOTAL KNEE ARTHROPLASTY  02/26/2012   Procedure: TOTAL KNEE ARTHROPLASTY;  Surgeon: Jacki Conesonald A Gioffre, MD;  Location: [facility];  Service: Orthopedics;  Laterality: Right;     A IV Location/Drains/Wounds Patient Lines/Drains/Airways Status   Active Line/Drains/Airways    Name:   Placement date:   Placement time:   Site:   Days:   Closed System Drain 1 Right Knee Accordion (Hemovac) 10 Fr.   02/26/12    1518    Knee   2347          Intake/Output Last 24 hours No intake or output data in the 24 hours ending 07/31/18 0013  Labs/Imaging Results for orders placed or performed during the hospital encounter of  07/30/18 (from the past 48 hour(s))  CBC with Differential     Status: Abnormal   Collection Time: 07/30/18 10:00 PM  Result Value Ref Range   WBC 12.3 (H) 4.0 - 10.5 K/uL   RBC 3.71 (L) 3.87 - 5.11 MIL/uL   Hemoglobin 10.8 (L) 12.0 - 15.0 g/dL   HCT 72.6 (L) 20.3 - 55.9 %   MCV 96.5 80.0 - 100.0 fL   MCH 29.1 26.0 - 34.0 pg   MCHC 30.2 30.0 - 36.0 g/dL   RDW 74.1 63.8 - 45.3 %   Platelets 231 150 - 400 K/uL   nRBC 0.0 0.0 - 0.2 %   Neutrophils Relative % 83 %   Neutro Abs 10.3 (H) 1.7 - 7.7 K/uL   Lymphocytes Relative 10 %   Lymphs Abs 1.2 0.7 - 4.0 K/uL   Monocytes Relative 6 %   Monocytes Absolute 0.7 0.1 - 1.0 K/uL   Eosinophils Relative 0 %    Eosinophils Absolute 0.0 0.0 - 0.5 K/uL   Basophils Relative 0 %   Basophils Absolute 0.0 0.0 - 0.1 K/uL   Immature Granulocytes 1 %   Abs Immature Granulocytes 0.07 0.00 - 0.07 K/uL    Comment: Performed at [facility], [address], [place], [place] [address]  Comprehensive metabolic panel     Status: Abnormal   Collection Time: 07/30/18 10:00 PM  Result Value Ref Range   Sodium 139 135 - 145 mmol/L   Potassium 3.8 3.5 - 5.1 mmol/L   Chloride 108 98 - 111 mmol/L   CO2 23 22 - 32 mmol/L   Glucose, Bld 124 (H) 70 - 99 mg/dL   BUN 21 8 - 23 mg/dL   Creatinine, Ser 3.21 0.44 - 1.00 mg/dL   Calcium 8.6 (L) 8.9 - 10.3 mg/dL   Total Protein 6.7 6.5 - 8.1 g/dL   Albumin 3.7 3.5 - 5.0 g/dL   AST 19 15 - 41 U/L   ALT 16 0 - 44 U/L   Alkaline Phosphatase 94 38 - 126 U/L   Total Bilirubin 0.8 0.3 - 1.2 mg/dL   GFR calc non Af Amer >60 >60 mL/min   GFR calc Af Amer >60 >60 mL/min   Anion gap 8 5 - 15    Comment: Performed at [facility], [address], [place], [place] [address]   Dg Chest 1 View  Result Date: 07/30/2018 CLINICAL DATA:  Preop after fall.  Baseline dementia. EXAM: CHEST  1 VIEW COMPARISON:  05/31/2018 FINDINGS: The lung volumes are low similar in appearance to prior. There is stable cardiomegaly with aortic atherosclerosis. Central pulmonary vascular congestion and mild interstitial edema is noted though less prominent than on prior. No apparent pulmonary consolidation to suggest pneumonia. No effusion or pneumothorax. No acute osseous abnormality. IMPRESSION: Low lung volumes with mild interstitial edema. Stable cardiomegaly with aortic atherosclerosis. Electronically Signed   By: Tollie Eth M.D.   On: 07/30/2018 21:42   Ct Head Wo Contrast  Result Date: 07/30/2018 CLINICAL DATA:  Dementia patient post unwitnessed fall. Head trauma, ataxia; C-spine trauma, high clinical risk (NEXUS/CCR) EXAM: CT HEAD WITHOUT CONTRAST CT CERVICAL SPINE  WITHOUT CONTRAST TECHNIQUE: Multidetector CT imaging of the head and cervical spine was performed following the standard protocol without intravenous contrast. Multiplanar CT image reconstructions of the cervical spine were also generated. COMPARISON:  Head CT 05/31/2018, head and cervical spine CT 11/30/2017 FINDINGS: CT HEAD FINDINGS Brain: Unchanged atrophy and chronic small  vessel ischemia. No intracranial hemorrhage, mass effect, or midline shift. No hydrocephalus. The basilar cisterns are patent. No evidence of territorial infarct or acute ischemia. No extra-axial or intracranial fluid collection. Vascular: Atherosclerosis of skullbase vasculature without hyperdense vessel or abnormal calcification. Skull: No fracture or focal lesion. Sinuses/Orbits: Paranasal sinuses and mastoid air cells are clear. The visualized orbits are unremarkable. Other: None. CT CERVICAL SPINE FINDINGS Alignment: No traumatic subluxation. Trace anterolisthesis of C7 on T1 is unchanged from prior exam and likely degenerative. Skull base and vertebrae: No acute fracture. Vertebral body heights are maintained. The dens and skull base are intact. Soft tissues and spinal canal: No prevertebral fluid or swelling. No visible canal hematoma. Disc levels: Diffuse disc space narrowing and endplate spurring. Scattered facet arthropathy. Degenerative changes are similar to prior exam. Upper chest: Patulous upper esophagus. Other: None. IMPRESSION: 1. No acute intracranial abnormality. No skull fracture. Stable atrophy and chronic small vessel ischemia. 2. Multilevel degenerative change in the cervical spine without acute fracture or subluxation. Electronically Signed   By: Narda RutherfordMelanie  Sanford M.D.   On: 07/30/2018 22:06   Ct Cervical Spine Wo Contrast  Result Date: 07/30/2018 CLINICAL DATA:  Dementia patient post unwitnessed fall. Head trauma, ataxia; C-spine trauma, high clinical risk (NEXUS/CCR) EXAM: CT HEAD WITHOUT CONTRAST CT CERVICAL SPINE  WITHOUT CONTRAST TECHNIQUE: Multidetector CT imaging of the head and cervical spine was performed following the standard protocol without intravenous contrast. Multiplanar CT image reconstructions of the cervical spine were also generated. COMPARISON:  Head CT 05/31/2018, head and cervical spine CT 11/30/2017 FINDINGS: CT HEAD FINDINGS Brain: Unchanged atrophy and chronic small vessel ischemia. No intracranial hemorrhage, mass effect, or midline shift. No hydrocephalus. The basilar cisterns are patent. No evidence of territorial infarct or acute ischemia. No extra-axial or intracranial fluid collection. Vascular: Atherosclerosis of skullbase vasculature without hyperdense vessel or abnormal calcification. Skull: No fracture or focal lesion. Sinuses/Orbits: Paranasal sinuses and mastoid air cells are clear. The visualized orbits are unremarkable. Other: None. CT CERVICAL SPINE FINDINGS Alignment: No traumatic subluxation. Trace anterolisthesis of C7 on T1 is unchanged from prior exam and likely degenerative. Skull base and vertebrae: No acute fracture. Vertebral body heights are maintained. The dens and skull base are intact. Soft tissues and spinal canal: No prevertebral fluid or swelling. No visible canal hematoma. Disc levels: Diffuse disc space narrowing and endplate spurring. Scattered facet arthropathy. Degenerative changes are similar to prior exam. Upper chest: Patulous upper esophagus. Other: None. IMPRESSION: 1. No acute intracranial abnormality. No skull fracture. Stable atrophy and chronic small vessel ischemia. 2. Multilevel degenerative change in the cervical spine without acute fracture or subluxation. Electronically Signed   By: Narda RutherfordMelanie  Sanford M.D.   On: 07/30/2018 22:06   Dg Hip Unilat W Or Wo Pelvis 2-3 Views Left  Result Date: 07/30/2018 CLINICAL DATA:  Preop left hip fracture EXAM: DG HIP (WITH OR WITHOUT PELVIS) 2-3V LEFT COMPARISON:  None. FINDINGS: Acute, closed, intertrochanteric fracture  of the left hip is noted. No joint dislocation is identified. The fracture also appears undermining the greater trochanter. No pelvic fracture. 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 Intact right hip and femur. Lower lumbar degenerative disc and facet arthropathy is seen from L4 through S1. Pelvic phleboliths are present. IMPRESSION: Acute, closed, intertrochanteric fracture of the left femur with fracture also undermining the greater trochanter. No significant angulation and slight displacement is seen. Electronically Signed   By: Tollie Ethavid  Kwon M.D.   On: 07/30/2018 21:46  Pending Labs Unresulted Labs (From admission, onward)    Start     Ordered   07/30/18 2113  Type and screen [facility]  Once,   STAT    Comments:  [facility]    07/30/18 2113   Signed and Held  CBC  (heparin)  Once,   R    Comments:  Baseline for heparin therapy IF NOT ALREADY DRAWN.  Notify MD if PLT < 100 K.    Signed and Held   Signed and Held  Creatinine, serum  (heparin)  Once,   R    Comments:  Baseline for heparin therapy IF NOT ALREADY DRAWN.    Signed and Held   Signed and Held  Comprehensive metabolic panel  Tomorrow morning,   R     Signed and Held   Signed and Held  CBC  Tomorrow morning,   R     Signed and Held   Signed and Held  TSH  Once,   R     Signed and Held          Vitals/Pain Today's Vitals   07/30/18 2056 07/30/18 2225 07/30/18 2334 07/31/18 0011  BP: (!) 145/68 136/84  130/67  Pulse: 61 62  67  Resp: 17 20  16   Temp: 97.6 F (36.4 C) 97.6 F (36.4 C)  97.9 F (36.6 C)  TempSrc: Oral Oral  Oral  SpO2: 93% 94%  97%  PainSc:  3  2      Isolation Precautions No active isolations  Medications Medications  fentaNYL (SUBLIMAZE) injection 25 mcg (25 mcg Intravenous Given 07/30/18 2224)    Mobility non-ambulatory High fall risk   Focused Assessments LEFT HIP FX AND npo / ORTH TO CONSULT    R Recommendations: See  Admitting Provider Note  Report given to:   Additional Notes:  FAMILY SON AT BED SIDE

## 2018-07-31 NOTE — Progress Notes (Signed)
Received from [facility]- 85 y/o female with diagnosis of fx lt hip. Arrived via EMS- noted AMS- able to only get her name out - mostly speaking nonsensical word salad. Unable to obtain history from pt- family has left- Son left message he requests to speak with phys prior to surgery- his number is [phone] his name is Homero Fellers and he is POA. CBG bath was done on arrival, purwick was placed for urine management- skin intact.

## 2018-07-31 NOTE — Progress Notes (Signed)
Pt returned to room 5N30 after surgery. See assessment. Will continue to monitor.

## 2018-07-31 NOTE — Anesthesia Preprocedure Evaluation (Addendum)
Anesthesia Evaluation  Patient identified by MRN, date of birth, ID band Patient confused    Reviewed: Allergy & Precautions, NPO status , Patient's Chart, lab work & pertinent test results  Airway Mallampati: II  TM Distance: >3 FB Neck ROM: Full    Dental  (+) Dental Advisory Given, Teeth Intact   Pulmonary    breath sounds clear to auscultation       Cardiovascular hypertension, + CAD, + Past MI and + Cardiac Stents   Rhythm:Regular Rate:Normal     Neuro/Psych Dementia    GI/Hepatic GERD  ,  Endo/Other  diabetesHypothyroidism   Renal/GU      Musculoskeletal  (+) Arthritis , Rheumatoid disorders,    Abdominal Normal abdominal exam  (+)   Peds  Hematology negative hematology ROS (+)   Anesthesia Other Findings   Reproductive/Obstetrics                           Lab Results  Component Value Date   WBC 12.3 (H) 07/30/2018   HGB 10.8 (L) 07/30/2018   HCT 35.8 (L) 07/30/2018   MCV 96.5 07/30/2018   PLT 231 07/30/2018   Lab Results  Component Value Date   CREATININE 0.69 07/30/2018   BUN 21 07/30/2018   NA 139 07/30/2018   K 3.8 07/30/2018   CL 108 07/30/2018   CO2 23 07/30/2018   Lab Results  Component Value Date   INR 0.94 02/21/2012   INR 0.9 12/25/2006   EKG: normal sinus rhythm.  Echo: - Left ventricle: The cavity size was normal. Wall thickness was   increased in a pattern of mild LVH. Systolic function was   vigorous. The estimated ejection fraction was in the range of 65%   to 70%. Wall motion was normal; there were no regional wall   motion abnormalities. Doppler parameters are consistent with   abnormal left ventricular relaxation (grade 1 diastolic   dysfunction). - Aortic valve: Trileaflet; mildly thickened, mildly calcified   leaflets. - Mitral valve: There was mild regurgitation. - Left atrium: The atrium was mildly dilated.  Anesthesia Physical Anesthesia  Plan  ASA: III  Anesthesia Plan: General   Post-op Pain Management:    Induction: Intravenous  PONV Risk Score and Plan: 4 or greater and Ondansetron and Treatment may vary due to age or medical condition  Airway Management Planned: Oral ETT  Additional Equipment: None  Intra-op Plan:   Post-operative Plan: Extubation in OR  Informed Consent: I have reviewed the patients History and Physical, chart, labs and discussed the procedure including the risks, benefits and alternatives for the proposed anesthesia with the patient or authorized representative who has indicated his/her understanding and acceptance.   Patient has DNR.  Discussed DNR with power of attorney and Suspend DNR.   Dental advisory given and Consent reviewed with POA  Plan Discussed with: CRNA  Anesthesia Plan Comments:        Anesthesia Quick Evaluation

## 2018-07-31 NOTE — Consult Note (Signed)
Orthopaedic Trauma Service (OTS) Consult   Patient ID: Michelle [name] MRN: [id number] DOB/AGE: Nov 20, 1927 85 y.o.  Reason for Consult: left femur fracture Referring Physician: Dr. Susa Simmonds  HPI: Michelle [name] is an 85 y.o. female being seen in consultation of Dr. Susa Simmonds for intertrochanteric fracture of the left femur. History obtained from chart due to severe dementia. Patient presented to [facility] ED via EMS after an unwitnessed fall at her living facility. Had immediate pain and was unable to ambulate following the incident. Orthopedics was consulted. Due to complexity of fracture, Dr. Susa Simmonds felt this would best be managed managed by an orthopaedic traumatologist. Patient was transferred to [facility] for surgical intervention this morning.  Patient doing okay this morning, appears relatively comfortable. Does not voice any concerns. No family at bedside.   Patient resides at [facility]. She has been using a walker since last Saturday when she previously fell.   Past Medical History:  Diagnosis Date  . Angina pectoris (HCC)    intrascapular, abnormal cardiolote preop TKR  . Arthritis   . ASCVD (arteriosclerotic cardiovascular disease)    S/P NSTEMI with LCX stent 2008, cath on 06/29/2008 revealed widely patent circumflex  . Bacterial infection 2016  . Coronary artery disease   . Diabetes mellitus   . GERD (gastroesophageal reflux disease)   . Heart attack (HCC) 2010  . Hyperlipidemia   . Hypertension   . Hypothyroidism   . Myocardial infarction [facility]) [id number]   with stents  . Rheumatoid arthritis (HCC) 2013  . Trigger finger 02/2013  . Vertigo 2016    Past Surgical History:  Procedure Laterality Date  . ABDOMINAL HYSTERECTOMY  1969  . BREAST BIOPSY  2005   right -benign  . BREAST SURGERY  2004   right -cancerous-with radiation  . broken finger  2006  . CORONARY ANGIOPLASTY  2008, 2010  . foot ampuation  12/2009   right  . HAMMER TOE SURGERY  06/2009   right   . HERNIA REPAIR  [id number]  . KNEE ARTHROSCOPY  1980 ,1994   bilateral  . TOTAL GASTRECTOMY  1983   with vagotomy  . TOTAL KNEE ARTHROPLASTY  02/26/2012   Procedure: TOTAL KNEE ARTHROPLASTY;  Surgeon: Jacki Cones, MD;  Location: [facility];  Service: Orthopedics;  Laterality: Right;    Family History  Problem Relation Age of Onset  . Dementia Neg Hx     Social History:  reports that she has never smoked. She has never used smokeless tobacco. She reports that she does not drink alcohol or use drugs.  Allergies: No Known Allergies  Medications: I have reviewed the patient's current medications.  ROS: Constitutional: No fever or chills Vision: No changes in vision ENT: No difficulty swallowing CV: No chest pain Pulm: No SOB or wheezing GI: No nausea or vomiting GU: No urgency or inability to hold urine Skin: No poor wound healing Neurologic: No numbness or tingling Psychiatric: No depression or anxiety Heme: No bruising Allergic: No reaction to medications or food   Exam: Blood pressure (!) 136/57, pulse 66, temperature 97.8 F (36.6 C), temperature source Oral, resp. rate 14, SpO2 98 %. General: NAD, resting comfortably in bed Orientation: Patient with advanced dementia, patient alert Mood and Affect: Mood and affect appropriate Gait: Not assessed due to known fracture  Cardiac: heart regular rate and rhythm Respiratory: CTA anterior lung fields bilaterally Left Lower extremity: Leg is short and externally rotated. Skin without lesions. Tenderness to palpation of hip  and thigh. Decreased range of motion. Neurovascularly intact.  Right Lower extremity: Skin without lesions. No tenderness to palpation. Full painless ROM, full strength in each muscle groups without evidence of instability. Neurovascularly intact   Medical Decision Making: Imaging: AP and lateral of the left hip show an intertrochanteric fracture of the left hip. The fracture also appears undermining the  greater trochanter. No joint dislocation is identified.   Labs:  Results for orders placed or performed during the hospital encounter of 07/30/18 (from the past 24 hour(s))  CBC with Differential     Status: Abnormal   Collection Time: 07/30/18 10:00 PM  Result Value Ref Range   WBC 12.3 (H) 4.0 - 10.5 K/uL   RBC 3.71 (L) 3.87 - 5.11 MIL/uL   Hemoglobin 10.8 (L) 12.0 - 15.0 g/dL   HCT 40.935.8 (L) 81.136.0 - 91.446.0 %   MCV 96.5 80.0 - 100.0 fL   MCH 29.1 26.0 - 34.0 pg   MCHC 30.2 30.0 - 36.0 g/dL   RDW 78.214.3 95.611.5 - 21.315.5 %   Platelets 231 150 - 400 K/uL   nRBC 0.0 0.0 - 0.2 %   Neutrophils Relative % 83 %   Neutro Abs 10.3 (H) 1.7 - 7.7 K/uL   Lymphocytes Relative 10 %   Lymphs Abs 1.2 0.7 - 4.0 K/uL   Monocytes Relative 6 %   Monocytes Absolute 0.7 0.1 - 1.0 K/uL   Eosinophils Relative 0 %   Eosinophils Absolute 0.0 0.0 - 0.5 K/uL   Basophils Relative 0 %   Basophils Absolute 0.0 0.0 - 0.1 K/uL   Immature Granulocytes 1 %   Abs Immature Granulocytes 0.07 0.00 - 0.07 K/uL  Comprehensive metabolic panel     Status: Abnormal   Collection Time: 07/30/18 10:00 PM  Result Value Ref Range   Sodium 139 135 - 145 mmol/L   Potassium 3.8 3.5 - 5.1 mmol/L   Chloride 108 98 - 111 mmol/L   CO2 23 22 - 32 mmol/L   Glucose, Bld 124 (H) 70 - 99 mg/dL   BUN 21 8 - 23 mg/dL   Creatinine, Ser 0.860.69 0.44 - 1.00 mg/dL   Calcium 8.6 (L) 8.9 - 10.3 mg/dL   Total Protein 6.7 6.5 - 8.1 g/dL   Albumin 3.7 3.5 - 5.0 g/dL   AST 19 15 - 41 U/L   ALT 16 0 - 44 U/L   Alkaline Phosphatase 94 38 - 126 U/L   Total Bilirubin 0.8 0.3 - 1.2 mg/dL   GFR calc non Af Amer >60 >60 mL/min   GFR calc Af Amer >60 >60 mL/min   Anion gap 8 5 - 15  Type and screen [facility]     Status: None   Collection Time: 07/30/18 10:00 PM  Result Value Ref Range   ABO/RH(D) A POS    Antibody Screen NEG    Sample Expiration      08/02/2018 Performed at [facility], [address]  [place], [place] [address]     Medical history and chart was reviewed  Assessment/Plan: 85 year old female s/p mechanical fall which resulted in intertrochanteric fracture of the left femur. I would recommend proceeding with intertrochanteric intramedullary nailing. The procedure, including risk and benefits, was discussed with the patient and her family. Risks discussed included bleeding requiring blood transfusion, bleeding causing a hematoma, infection, malunion, nonunion, damage to surrounding nerves and blood vessels, pain, hardware prominence or irritation, hardware failure, stiffness, post-traumatic arthritis,  DVT/PE, compartment syndrome, and even death. Patient would like to proceed with surgery. All questions were anwsered and consent was obtained.   Kerrin Markman A. Lad[facility] ?[phone] (phone)

## 2018-07-31 NOTE — Progress Notes (Addendum)
PROGRESS NOTE  Michelle FaesJoan T [name] ION:[id number]:[id number] DOB: Oct 03, 1927 DOA: 07/30/2018 PCP: Ralene OkMoreira, Roy, MD  HPI/Recap of past 6424 hours: 85 year old female with past medical history of hypertension coronary artery disease her thyroidism GERD hyperlipidemia and dementia who lives in a nursing home was brought from nursing home because of fall.  Found to have left inner trochanteric fracture   Subjective: Patient seen and examined at bedside her son Homero FellersFrank is at the room.  Patient seems agitated and later talking out of her head making normal senseless conversation with her son  Assessment/Plan: Principal Problem:   Closed intertrochanteric fracture, left, initial encounter [facility](HCC) Active Problems:   Hypertension   Coronary artery disease   Hypothyroidism   GERD (gastroesophageal reflux disease)   Mixed hyperlipidemia   Dementia without behavioral disturbance (HCC)   1.  Fall at the nursing facility  2.  Left intertrochanteric fracture status post cephalomedullary nailing of left intertrochanteric femur fracture  3.  Dementia which is worsening by most likely anesthesia and recent surgery.  4.  Coronary artery disease status post stent 2008 LCX stable continue statins  5.Hypothyroidism continue levothyroxine  6.Hypertension continue home meds  7.GERD continue PPI  8.Hyperlipidemia continue statin   Code Status: DNR  Severity of Illness: The appropriate patient status for this patient is INPATIENT. Inpatient status is judged to be reasonable and necessary in order to provide the required intensity of service to ensure the patient's safety. The patient's presenting symptoms, physical exam findings, and initial radiographic and laboratory data in the context of their chronic comorbidities is felt to place them at high risk for further clinical deterioration. Furthermore, it is not anticipated that the patient will be medically stable for discharge from the hospital within 2 midnights of  admission. The following factors support the patient status of inpatient.   " The patient's presenting symptoms include fall with fracture left intertrochanteric fracture patient is status post fixation patient needs continued stay in the hospital for overnight monitoring "  * I certify that at the point of admission it is my clinical judgment that the patient will require inpatient hospital care spanning beyond 2 midnights from the point of admission due to high intensity of service, high risk for further deterioration and high frequency of surveillance required.*    Family Communication: Son Homero FellersFrank at bedside  Disposition Plan: [facility]   Consultants:  Orthopedic Dr. Jena GaussHaddix  Procedures:  cephalo-medullary nailing of left intertrochanteric femoral fracture, July 31, 2018  Antimicrobials:  Preop Ancef 2 g  DVT prophylaxis: sCD   Objective: Vitals:   07/31/18 1132 07/31/18 1145 07/31/18 1147 07/31/18 1155  BP: (!) 158/70  (!) 161/74   Pulse: 83 83 76 87  Resp: 16 19 18  (!) 22  Temp:   (!) 97 F (36.1 C)   TempSrc:      SpO2: 96% 95% 96% 97%  Height:        Intake/Output Summary (Last 24 hours) at 07/31/2018 1205 Last data filed at 07/31/2018 1047 Gross per 24 hour  Intake 722.8 ml  Output 450 ml  Net 272.8 ml   There were no vitals filed for this visit. Body mass index is 24.91 kg/m.  Exam:  . General: 85 y.o. year-old female well developed well nourished in no acute distress.  Alert and oriented x3. . Cardiovascular: Regular rate and rhythm with no rubs or gallops.  No thyromegaly or JVD noted.   Marland Kitchen. Respiratory: Clear to auscultation  with no wheezes or rales. Good inspiratory effort. . Abdomen: Soft nontender nondistended with normal bowel sounds x4 quadrants. . Musculoskeletal: No lower extremity edema. 2/4 pulses in all 4 extremities. . Skin: No ulcerative lesions noted or rashes, . Psychiatry: Mood is appropriate for condition  and setting patient is slightly confused and agitated speaking to her son in the room and getting agitated    Data Reviewed: CBC: Recent Labs  Lab 07/30/18 2200  WBC 12.3*  NEUTROABS 10.3*  HGB 10.8*  HCT 35.8*  MCV 96.5  PLT 231   Basic Metabolic Panel: Recent Labs  Lab 07/30/18 2200  NA 139  K 3.8  CL 108  CO2 23  GLUCOSE 124*  BUN 21  CREATININE 0.69  CALCIUM 8.6*   GFR: CrCl cannot be calculated (Unknown ideal weight.). Liver Function Tests: Recent Labs  Lab 07/30/18 2200  AST 19  ALT 16  ALKPHOS 94  BILITOT 0.8  PROT 6.7  ALBUMIN 3.7   No results for input(s): LIPASE, AMYLASE in the last 168 hours. No results for input(s): AMMONIA in the last 168 hours. Coagulation Profile: No results for input(s): INR, PROTIME in the last 168 hours. Cardiac Enzymes: No results for input(s): CKTOTAL, CKMB, CKMBINDEX, TROPONINI in the last 168 hours. BNP (last 3 results) No results for input(s): PROBNP in the last 8760 hours. HbA1C: No results for input(s): HGBA1C in the last 72 hours. CBG: Recent Labs  Lab 07/31/18 1048  GLUCAP 151*   Lipid Profile: No results for input(s): CHOL, HDL, LDLCALC, TRIG, CHOLHDL, LDLDIRECT in the last 72 hours. Thyroid Function Tests: No results for input(s): TSH, T4TOTAL, FREET4, T3FREE, THYROIDAB in the last 72 hours. Anemia Panel: No results for input(s): VITAMINB12, FOLATE, FERRITIN, TIBC, IRON, RETICCTPCT in the last 72 hours. Urine analysis:    Component Value Date/Time   COLORURINE YELLOW 11/30/2017 1420   APPEARANCEUR CLEAR 11/30/2017 1420   LABSPEC 1.028 11/30/2017 1420   PHURINE 5.0 11/30/2017 1420   GLUCOSEU NEGATIVE 11/30/2017 1420   HGBUR NEGATIVE 11/30/2017 1420   BILIRUBINUR NEGATIVE 11/30/2017 1420   KETONESUR 5 (A) 11/30/2017 1420   PROTEINUR NEGATIVE 11/30/2017 1420   UROBILINOGEN 1.0 03/12/2012 1739   NITRITE NEGATIVE 11/30/2017 1420   LEUKOCYTESUR NEGATIVE 11/30/2017 1420   Sepsis  Labs: @LABRCNTIP (procalcitonin:4,lacticidven:4)  ) Recent Results (from the past 240 hour(s))  MRSA PCR Screening     Status: None   Collection Time: 07/31/18  6:21 AM  Result Value Ref Range Status   MRSA by PCR NEGATIVE NEGATIVE Final    Comment:        The GeneXpert MRSA Assay (FDA approved for NASAL specimens only), is one component of a comprehensive MRSA colonization surveillance program. It is not intended to diagnose MRSA infection nor to guide or monitor treatment for MRSA infections. Performed at [facility], [address] [place], [place] [address]   Surgical pcr screen     Status: None   Collection Time: 07/31/18  8:10 AM  Result Value Ref Range Status   MRSA, PCR NEGATIVE NEGATIVE Final   Staphylococcus aureus NEGATIVE NEGATIVE Final    Comment: (NOTE) The Xpert SA Assay (FDA approved for NASAL specimens in patients 13 years of age and older), is one component of a comprehensive surveillance program. It is not intended to diagnose infection nor to guide or monitor treatment. Performed at [facility], [address], [place], [place] [address]       Studies: Dg Chest 1  View  Result Date: 07/30/2018 CLINICAL DATA:  Preop after fall.  Baseline dementia. EXAM: CHEST  1 VIEW COMPARISON:  05/31/2018 FINDINGS: The lung volumes are low similar in appearance to prior. There is stable cardiomegaly with aortic atherosclerosis. Central pulmonary vascular congestion and mild interstitial edema is noted though less prominent than on prior. No apparent pulmonary consolidation to suggest pneumonia. No effusion or pneumothorax. No acute osseous abnormality. IMPRESSION: Low lung volumes with mild interstitial edema. Stable cardiomegaly with aortic atherosclerosis. Electronically Signed   By: Tollie Ethavid  Kwon M.D.   On: 07/30/2018 21:42   Ct Head Wo Contrast  Result Date: 07/30/2018 CLINICAL DATA:  Dementia patient post unwitnessed fall. Head trauma, ataxia; C-spine  trauma, high clinical risk (NEXUS/CCR) EXAM: CT HEAD WITHOUT CONTRAST CT CERVICAL SPINE WITHOUT CONTRAST TECHNIQUE: Multidetector CT imaging of the head and cervical spine was performed following the standard protocol without intravenous contrast. Multiplanar CT image reconstructions of the cervical spine were also generated. COMPARISON:  Head CT 05/31/2018, head and cervical spine CT 11/30/2017 FINDINGS: CT HEAD FINDINGS Brain: Unchanged atrophy and chronic small vessel ischemia. No intracranial hemorrhage, mass effect, or midline shift. No hydrocephalus. The basilar cisterns are patent. No evidence of territorial infarct or acute ischemia. No extra-axial or intracranial fluid collection. Vascular: Atherosclerosis of skullbase vasculature without hyperdense vessel or abnormal calcification. Skull: No fracture or focal lesion. Sinuses/Orbits: Paranasal sinuses and mastoid air cells are clear. The visualized orbits are unremarkable. Other: None. CT CERVICAL SPINE FINDINGS Alignment: No traumatic subluxation. Trace anterolisthesis of C7 on T1 is unchanged from prior exam and likely degenerative. Skull base and vertebrae: No acute fracture. Vertebral body heights are maintained. The dens and skull base are intact. Soft tissues and spinal canal: No prevertebral fluid or swelling. No visible canal hematoma. Disc levels: Diffuse disc space narrowing and endplate spurring. Scattered facet arthropathy. Degenerative changes are similar to prior exam. Upper chest: Patulous upper esophagus. Other: None. IMPRESSION: 1. No acute intracranial abnormality. No skull fracture. Stable atrophy and chronic small vessel ischemia. 2. Multilevel degenerative change in the cervical spine without acute fracture or subluxation. Electronically Signed   By: Narda RutherfordMelanie  Sanford M.D.   On: 07/30/2018 22:06   Ct Cervical Spine Wo Contrast  Result Date: 07/30/2018 CLINICAL DATA:  Dementia patient post unwitnessed fall. Head trauma, ataxia; C-spine  trauma, high clinical risk (NEXUS/CCR) EXAM: CT HEAD WITHOUT CONTRAST CT CERVICAL SPINE WITHOUT CONTRAST TECHNIQUE: Multidetector CT imaging of the head and cervical spine was performed following the standard protocol without intravenous contrast. Multiplanar CT image reconstructions of the cervical spine were also generated. COMPARISON:  Head CT 05/31/2018, head and cervical spine CT 11/30/2017 FINDINGS: CT HEAD FINDINGS Brain: Unchanged atrophy and chronic small vessel ischemia. No intracranial hemorrhage, mass effect, or midline shift. No hydrocephalus. The basilar cisterns are patent. No evidence of territorial infarct or acute ischemia. No extra-axial or intracranial fluid collection. Vascular: Atherosclerosis of skullbase vasculature without hyperdense vessel or abnormal calcification. Skull: No fracture or focal lesion. Sinuses/Orbits: Paranasal sinuses and mastoid air cells are clear. The visualized orbits are unremarkable. Other: None. CT CERVICAL SPINE FINDINGS Alignment: No traumatic subluxation. Trace anterolisthesis of C7 on T1 is unchanged from prior exam and likely degenerative. Skull base and vertebrae: No acute fracture. Vertebral body heights are maintained. The dens and skull base are intact. Soft tissues and spinal canal: No prevertebral fluid or swelling. No visible canal hematoma. Disc levels: Diffuse disc space narrowing and endplate spurring. Scattered facet arthropathy. Degenerative changes are similar  to prior exam. Upper chest: Patulous upper esophagus. Other: None. IMPRESSION: 1. No acute intracranial abnormality. No skull fracture. Stable atrophy and chronic small vessel ischemia. 2. Multilevel degenerative change in the cervical spine without acute fracture or subluxation. Electronically Signed   By: Narda Rutherford M.D.   On: 07/30/2018 22:06   Dg C-arm 1-60 Min  Result Date: 07/31/2018 CLINICAL DATA:  INTRAMEDULLARY (IM) NAIL INTERTROCHANTRIC - LEFT Dr. Lytle Michaels cone OR room 3  1 minute 20 seconds fluoro time 5 images and dose summary saved to PACS EXAM: OPERATIVE LEFT HIP (WITH PELVIS IF PERFORMED) 5 VIEWS TECHNIQUE: Fluoroscopic spot image(s) were submitted for interpretation post-operatively. COMPARISON:  None FINDINGS: Images are performed prior to and during placement of lag screw and intramedullary nail across it has intertrochanteric fracture. No dislocation or interval fracture. IMPRESSION: ORIF of the left hip. Electronically Signed   By: Norva Pavlov M.D.   On: 07/31/2018 10:42   Dg Hip Operative Unilat W Or W/o Pelvis Left  Result Date: 07/31/2018 CLINICAL DATA:  INTRAMEDULLARY (IM) NAIL INTERTROCHANTRIC - LEFT Dr. Lytle Michaels cone OR room 3 1 minute 20 seconds fluoro time 5 images and dose summary saved to PACS EXAM: OPERATIVE LEFT HIP (WITH PELVIS IF PERFORMED) 5 VIEWS TECHNIQUE: Fluoroscopic spot image(s) were submitted for interpretation post-operatively. COMPARISON:  None FINDINGS: Images are performed prior to and during placement of lag screw and intramedullary nail across it has intertrochanteric fracture. No dislocation or interval fracture. IMPRESSION: ORIF of the left hip. Electronically Signed   By: Norva Pavlov M.D.   On: 07/31/2018 10:42   Dg Hip Unilat W Or Wo Pelvis 2-3 Views Left  Result Date: 07/30/2018 CLINICAL DATA:  Preop left hip fracture EXAM: DG HIP (WITH OR WITHOUT PELVIS) 2-3V LEFT COMPARISON:  None. FINDINGS: Acute, closed, intertrochanteric fracture of the left hip is noted. No joint dislocation is identified. The fracture also appears undermining the greater trochanter. No pelvic fracture. 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 Intact right hip and femur. Lower lumbar degenerative disc and facet arthropathy is seen from L4 through S1. Pelvic phleboliths are present. IMPRESSION: Acute, closed, intertrochanteric fracture of the left femur with fracture also undermining the greater trochanter. No significant  angulation and slight displacement is seen. Electronically Signed   By: Tollie Eth M.D.   On: 07/30/2018 21:46    Scheduled Meds: . acetaminophen  325-650 mg Oral Once   Or  . acetaminophen (TYLENOL) oral liquid 160 mg/5 mL  325-650 mg Oral Once  . [MAR Hold] busPIRone  15 mg Oral TID  . [MAR Hold] heparin  5,000 Units Subcutaneous Q8H  . [MAR Hold] sertraline  100 mg Oral Daily    Continuous Infusions: . sodium chloride 75 mL/hr at 07/31/18 0201  . acetaminophen    . lactated ringers 10 mL/hr at 07/31/18 0906  . lactated ringers       LOS: 1 day     Myrtie Neither, MD [facility]  To reach me or the doctor on call, go to: [email or web address] Password [email or web address]  07/31/2018, 12:05 PM

## 2018-07-31 NOTE — ED Notes (Signed)
Report called [facility] - aaron rn  Paper work [employer] investment banker requested  Waiting carelink report to ems arriving

## 2018-07-31 NOTE — Progress Notes (Signed)
Pre-op report called and given to Bonnie, RN in Short Stay. All questions answered to satisfaction. OR personnel to transport pt.  

## 2018-07-31 NOTE — Op Note (Signed)
Orthopaedic Surgery Operative Note (CSN: [id number] ) Date of Surgery: 07/31/2018  Admit Date: 07/30/2018   Diagnoses: Pre-Op Diagnoses: Left intertrochanteric femur fracture  Post-Op Diagnosis: Same  Procedures: CPT 27245-Cephalomedullary nailing of left intertrochanteric femur fracture  Surgeons : Primary: Roby Lofts, MD  Assistant: Ulyses Southward, PA-C  Location:OR 3   Anesthesia:General   Antibiotics: Ancef 2g preop   Tourniquet time:None  Estimated Blood Loss:175 mL  Complications:None  Specimens:None  Implants: Implant Name Type Inv. Item Serial No. Manufacturer Lot No. LRB No. Used Action  IMPLANT DEG TI CANN 11MM/130 - [id number] Orthopedic Implant IMPLANT DEG TI CANN 11MM/130  [employer] [id number] Left 1 Implanted  BLADE HELICAL TFNA HIP - [id number] Anchor BLADE HELICAL TFNA HIP  [employer] [id number] Left 1 Implanted  SCREW LOCKING 5.0X38MM - [id number] Screw SCREW LOCKING 5.0X38MM  [employer] [id number] Left 1 Implanted    Indications for Surgery: 85 year old female with a history of dementia as well as coronary artery disease that presents after a ground-level fall with a left intertrochanteric femur fracture.  I felt that proceeding with a cephalo-medullary nailing was most appropriate.  Risks and benefits were discussed with the patient's son.  Risks included but not limited to bleeding, infection, malunion, nonunion, hardware failure including cut out, periprosthetic fracture, nerve and blood vessel injury, DVT, even the possibility of stroke or heart attack.  He agrees to proceed with surgery and consent was obtained.  Operative Findings: Cepahlomedullary nailing of left intertrochanteric femur fracture with [employer] short TFN size 78mm with 13mm helical blade  Procedure: The patient was identified in the preoperative holding area. Consent was confirmed with the patient and their family and all questions were answered. The operative extremity  was marked after confirmation with the patient and they were then brought back to the operating room by our anesthesia colleagues. The patient was placed under general anesthesia and then carefully transferred over to a Hana. The feet were secured into a traction boot and well padded. A post was placed in the groin and traction was pulled on the operative leg. The contralateral leg was positioned out of the way of fluoroscopy and secure . Fluoroscopic images were obtained and traction and manipulation was performed to reduce the fracture. Once adequate reduction was performed then the operative extremity was prepped and draped in sterile fashion. Preincision timeout was performed to verify the patient, the procedure and the extremity. Preoperative antibiotics were dosed.  A small incision was made proximal to the greater trochanter. A curved Mayo scissors was used to spread down to the greater trochanter in line with the abductor musculature. A threaded guidepin was positioned at an appropriate starting point on the AP and lateral views. It was advanced in the femur past the lesser trochanter. A entry reamer with soft tissue protector was then used to enter the canal. A radiographic ruler was used to judge the size of the canal of the femur and a 25mm short nail was placed into the canal and seated down to an appropriate position radiographically. The targeting arm for the helical blade was attached. A percutaneous incision was made for the guide for the helical blade. A threaded guidepin was placed into the femoral neck and head and fluoroscopy was used to confirm adequate placement with an acceptable tip-apex distance. A drill was used to perforate the lateral cortex and 55mm helical blade was inserted into the head/neck segment. The set screw was then tightened to set rotation and backed off to  allow compression. The construct was compressed and obtained some further reduction of the fracture. The aiming arm  was removed from the nail and position of the helical blade was confirmed with fluoroscopy. Using the targeting arm a distal interlock was placed bicortically in the shaft.  Final fluoroscopic images were obtained and the incisions were copiously irrigated. The skin was closed with 2-0 vicryl, 3-0 monocryl and sealed with dermabond. The incisions were dressing with Mepilex dressings. The patient was carefully transferred to the regular floor bed and was taken to PACU in stable condition.  Post Op Plan/Instructions: Patient will be weightbearing as tolerated to left leg. Ancef postoperatively. Lovenox for DVT prophylaxis for 1 month, to start postoperative day 1 if hemoglobin stable.  I was present and performed the entire surgery.  Ulyses Southward, PA-C did assist me throughout the case. An assistant was necessary given the difficulty in approach, maintenance of reduction and ability to instrument the fracture.  Truitt Merle, MD [facility]

## 2018-07-31 NOTE — Anesthesia Procedure Notes (Signed)
Procedure Name: Intubation Date/Time: 07/31/2018 9:42 AM Performed by: Julian Reil, CRNA Pre-anesthesia Checklist: Patient identified, Emergency Drugs available, Suction available and Patient being monitored Patient Re-evaluated:Patient Re-evaluated prior to induction Oxygen Delivery Method: Circle system utilized Preoxygenation: Pre-oxygenation with 100% oxygen Induction Type: IV induction Ventilation: Mask ventilation without difficulty Laryngoscope Size: Miller and 3 Grade View: Grade I Tube type: Oral Tube size: 7.0 mm Number of attempts: 2 Airway Equipment and Method: Stylet Placement Confirmation: ETT inserted through vocal cords under direct vision,  positive ETCO2 and breath sounds checked- equal and bilateral Secured at: 22 cm Tube secured with: Tape Dental Injury: Teeth and Oropharynx as per pre-operative assessment  Comments: 1st DL patient swallowing, additional IV induction drugs given.  2nd DL as above.  4x4s bite block used.

## 2018-07-31 NOTE — Progress Notes (Signed)
Lab came to draw blood- pt apparently told her "no" so she left without obtaining specimen

## 2018-07-31 NOTE — Progress Notes (Signed)
RN notified [facility] of that patient keeps removing all telemetry leads.   Patient has also removed IV. IV team currently at the bedside.   Patient has been given lap mat for therapeutic distraction with no success.   Nursing will continue to monitor.

## 2018-08-01 LAB — CBC
HCT: 31 % — ABNORMAL LOW (ref 36.0–46.0)
Hemoglobin: 9.7 g/dL — ABNORMAL LOW (ref 12.0–15.0)
MCH: 28.3 pg (ref 26.0–34.0)
MCHC: 31.3 g/dL (ref 30.0–36.0)
MCV: 90.4 fL (ref 80.0–100.0)
Platelets: 196 10*3/uL (ref 150–400)
RBC: 3.43 MIL/uL — AB (ref 3.87–5.11)
RDW: 14 % (ref 11.5–15.5)
WBC: 8.6 10*3/uL (ref 4.0–10.5)
nRBC: 0 % (ref 0.0–0.2)

## 2018-08-01 MED ORDER — DONEPEZIL HCL 5 MG PO TABS
5.0000 mg | ORAL_TABLET | Freq: Every day | ORAL | Status: DC
  Administered 2018-08-01 – 2018-08-03 (×3): 5 mg via ORAL
  Filled 2018-08-01 (×3): qty 1

## 2018-08-01 NOTE — Progress Notes (Addendum)
PROGRESS NOTE  Michelle FaesJoan T [name] ZOX:[id number] DOB: 04/24/1928 DOA: 07/30/2018 PCP: Ralene OkMoreira, Roy, MD  HPI/Recap of past 6724 hours: 85 year old female with past medical history of hypertension coronary artery disease her thyroidism GERD hyperlipidemia and dementia who lives in a nursing home was brought from nursing home because of fall.  Found to have left inner trochanteric fracture   Subjective: Patient seen and examined at bedside her son Homero FellersFrank is at the room.  Patient seems agitated and later talking out of her head making normal senseless conversation with her son.  Subjective: August 01, 2018. Patient seen and examined at bedside.  Nurse reported patient was slightly more agitated and almost combative  Assessment/Plan: Principal Problem:   Closed intertrochanteric fracture, left, initial encounter [facility](HCC) Active Problems:   Hypertension   Coronary artery disease   Hypothyroidism   GERD (gastroesophageal reflux disease)   Mixed hyperlipidemia   Dementia without behavioral disturbance (HCC)   1.  Fall at the nursing facility  2.  Left intertrochanteric fracture status post intramedullary nailing of left intertrochanteric femur fracture.  Patient is getting physical therapy waiting for transfer to her nursing home facility  3.  Dementia which is worsening by most likely anesthesia and recent surgery..  Patient continues to be agitated may be behavioral related dementia but I will check a UA to make sure she is not having any urinary tract infection since this is out of her normal also check other electrolytes  4.  Coronary artery disease status post stent 2008 LCx stable continue statins  5.Hypothyroidism continue levothyroxine  6.Hypertension continue home meds  7.GERD continue PPI  8.Hyperlipidemia continue statin  9.  Anemia could be due to blood loss status post surgery will monitor   Code Status: DNR  Severity of Illness: The appropriate patient status for this patient  is INPATIENT. Inpatient status is judged to be reasonable and necessary in order to provide the required intensity of service to ensure the patient's safety. The patient's presenting symptoms, physical exam findings, and initial radiographic and laboratory data in the context of their chronic comorbidities is felt to place them at high risk for further clinical deterioration. Furthermore, it is not anticipated that the patient will be medically stable for discharge from the hospital within 2 midnights of admission. The following factors support the patient status of inpatient.   " The patient's presenting symptoms include fall with fracture left intertrochanteric fracture patient is status post fixation patient needs continued stay in the hospital for overnight monitoring "  * I certify that at the point of admission it is my clinical judgment that the patient will require inpatient hospital care spanning beyond 2 midnights from the point of admission due to high intensity of service, high risk for further deterioration and high frequency of surveillance required.*    Family Communication: Son Homero FellersFrank at bedside  Disposition Plan: [facility]   Consultants:  Orthopedic Dr. Jena GaussHaddix  Procedures:  cephalo-medullary nailing of left intertrochanteric femoral fracture, July 31, 2018  Antimicrobials:  Preop Ancef 2 g  DVT prophylaxis: sCD   Objective: Vitals:   07/31/18 2202 08/01/18 0045 08/01/18 0648 08/01/18 1406  BP: 139/63 138/61 121/80 (!) 123/55  Pulse: 62 62 70 71  Resp:    16  Temp: 100 F (37.8 C) 99.4 F (37.4 C) 98.6 F (37 C) (!) 97.3 F (36.3 C)  TempSrc: Oral Axillary Oral Oral  SpO2: 97% 96% 100% 97%  Height:  Intake/Output Summary (Last 24 hours) at 08/01/2018 1949 Last data filed at 08/01/2018 1649 Gross per 24 hour  Intake 2348.9 ml  Output 1950 ml  Net 398.9 ml   There were no vitals filed for this visit. Body mass index is  24.91 kg/m.  Exam:  . General: 85 y.o. year-old female well developed well nourished in no acute distress.  Alert and oriented x1 . Cardiovascular: Regular rate and rhythm with no rubs or gallops.  No thyromegaly or JVD noted.   Marland Kitchen Respiratory: Clear to auscultation with no wheezes or rales. Good inspiratory effort. . Abdomen: Soft nontender nondistended with normal bowel sounds x4 quadrants. . Musculoskeletal: No lower extremity edema. 2/4 pulses in all 4 extremities. . Skin: No ulcerative lesions noted or rashes, Psychiatry: Mood is appropriate for condition and setting patient is slightly confused not agitated at this moment she is watching TV and, making some comments about it she is speaking out of her head mostly not making a whole lot of sense   Data Reviewed: CBC: Recent Labs  Lab 07/30/18 2200 08/01/18 0339  WBC 12.3* 8.6  NEUTROABS 10.3*  --   HGB 10.8* 9.7*  HCT 35.8* 31.0*  MCV 96.5 90.4  PLT 231 196   Basic Metabolic Panel: Recent Labs  Lab 07/30/18 2200  NA 139  K 3.8  CL 108  CO2 23  GLUCOSE 124*  BUN 21  CREATININE 0.69  CALCIUM 8.6*   GFR: CrCl cannot be calculated (Unknown ideal weight.). Liver Function Tests: Recent Labs  Lab 07/30/18 2200  AST 19  ALT 16  ALKPHOS 94  BILITOT 0.8  PROT 6.7  ALBUMIN 3.7   No results for input(s): LIPASE, AMYLASE in the last 168 hours. No results for input(s): AMMONIA in the last 168 hours. Coagulation Profile: No results for input(s): INR, PROTIME in the last 168 hours. Cardiac Enzymes: No results for input(s): CKTOTAL, CKMB, CKMBINDEX, TROPONINI in the last 168 hours. BNP (last 3 results) No results for input(s): PROBNP in the last 8760 hours. HbA1C: No results for input(s): HGBA1C in the last 72 hours. CBG: Recent Labs  Lab 07/31/18 1048  GLUCAP 151*   Lipid Profile: No results for input(s): CHOL, HDL, LDLCALC, TRIG, CHOLHDL, LDLDIRECT in the last 72 hours. Thyroid Function Tests: No results  for input(s): TSH, T4TOTAL, FREET4, T3FREE, THYROIDAB in the last 72 hours. Anemia Panel: No results for input(s): VITAMINB12, FOLATE, FERRITIN, TIBC, IRON, RETICCTPCT in the last 72 hours. Urine analysis:    Component Value Date/Time   COLORURINE YELLOW 11/30/2017 1420   APPEARANCEUR CLEAR 11/30/2017 1420   LABSPEC 1.028 11/30/2017 1420   PHURINE 5.0 11/30/2017 1420   GLUCOSEU NEGATIVE 11/30/2017 1420   HGBUR NEGATIVE 11/30/2017 1420   BILIRUBINUR NEGATIVE 11/30/2017 1420   KETONESUR 5 (A) 11/30/2017 1420   PROTEINUR NEGATIVE 11/30/2017 1420   UROBILINOGEN 1.0 03/12/2012 1739   NITRITE NEGATIVE 11/30/2017 1420   LEUKOCYTESUR NEGATIVE 11/30/2017 1420   Sepsis Labs: @LABRCNTIP (procalcitonin:4,lacticidven:4)  ) Recent Results (from the past 240 hour(s))  MRSA PCR Screening     Status: None   Collection Time: 07/31/18  6:21 AM  Result Value Ref Range Status   MRSA by PCR NEGATIVE NEGATIVE Final    Comment:        The GeneXpert MRSA Assay (FDA approved for NASAL specimens only), is one component of a comprehensive MRSA colonization surveillance program. It is not intended to diagnose MRSA infection nor to guide or monitor treatment for MRSA infections.  Performed at [facility], [address], [place], [place] [address]   Surgical pcr screen     Status: None   Collection Time: 07/31/18  8:10 AM  Result Value Ref Range Status   MRSA, PCR NEGATIVE NEGATIVE Final   Staphylococcus aureus NEGATIVE NEGATIVE Final    Comment: (NOTE) The Xpert SA Assay (FDA approved for NASAL specimens in patients 67 years of age and older), is one component of a comprehensive surveillance program. It is not intended to diagnose infection nor to guide or monitor treatment. Performed at [facility], [address], [place], [place] [address]       Studies: No results found.  Scheduled Meds: . acetaminophen  650 mg Oral Q6H  . busPIRone  15 mg Oral TID  . donepezil  5 mg  Oral QHS  . enoxaparin (LOVENOX) injection  40 mg Subcutaneous Q24H  . sertraline  100 mg Oral Daily    Continuous Infusions: . sodium chloride 75 mL/hr at 08/01/18 0700  . lactated ringers 10 mL/hr at 07/31/18 0906     LOS: 2 days     Myrtie Neither, MD [facility]  To reach me or the doctor on call, go to: [email or web address] Password [facility]  08/01/2018, 7:49 PM

## 2018-08-01 NOTE — Evaluation (Signed)
Physical Therapy Evaluation Patient Details Name: Michelle [name] MRN: [id number] DOB: 1928/03/09 Today's Date: 08/01/2018   History of Present Illness  Pt is an 85 year old woman admitted after fall resulting in L intertrochanteric femur fx, underwent IM nail 07/31/18. PMH: advanced dementia, peripheral neuropathy, CAD, MI, DM, RA, vertigo, hypothyroidism, HLD.  Clinical Impression  Pt was seen for mobility after having fractured her L femur and having IM nailing on 07/31/18.  Pt is reluctant to move but able to assist with standing, pivoting and taking a couple steps.  She had no complaints of pain, but did demonstrate anxiety with walking.  Will follow up with her to recommend SNF and work toward more gait and independence with transfers to decrease her time in rehab.  Family was contacted by PT for some history and they are in agreement if this can be approved.    Follow Up Recommendations SNF    Equipment Recommendations  None recommended by PT    Recommendations for Other Services       Precautions / Restrictions Precautions Precautions: Fall Precaution Comments: WBAT Restrictions Weight Bearing Restrictions: Yes LLE Weight Bearing: Weight bearing as tolerated      Mobility  Bed Mobility Overal bed mobility: Needs Assistance Bed Mobility: Supine to Sit     Supine to sit: +2 for physical assistance;+2 for safety/equipment;HOB elevated;Mod assist     General bed mobility comments: assisted to scoot out, to lift trunk and to manage LLE  Transfers Overall transfer level: Needs assistance Equipment used: Rolling walker (2 wheeled);2 person hand held assist Transfers: Sit to/from Stand;Stand Pivot Transfers Sit to Stand: +2 physical assistance;+2 safety/equipment;From elevated surface;Mod assist Stand pivot transfers: Mod assist;+2 physical assistance;+2 safety/equipment;From elevated surface       General transfer comment: assist with vc's for sequence and  safety  Ambulation/Gait Ambulation/Gait assistance: Mod assist;+2 physical assistance;+2 safety/equipment Gait Distance (Feet): 2 Feet Assistive device: Rolling walker (2 wheeled);2 person hand held assist Gait Pattern/deviations: Step-to pattern;Decreased stride length;Decreased weight shift to left;Decreased stance time - left Gait velocity: reduced   General Gait Details: pt is reluctant to WB on LLE but does not specifically talk about any pain  Stairs            Wheelchair Mobility    Modified Rankin (Stroke Patients Only)       Balance Overall balance assessment: Needs assistance;History of Falls Sitting-balance support: Bilateral upper extremity supported;Feet supported Sitting balance-Leahy Scale: Fair Sitting balance - Comments: fair once set   Standing balance support: Bilateral upper extremity supported Standing balance-Leahy Scale: Poor Standing balance comment: requires B UE and +2 support                             Pertinent Vitals/Pain Pain Assessment: Faces Faces Pain Scale: No hurt Pain Location: pt did demonstrate anxiety with gait    Home Living Family/patient expects to be discharged to:: Assisted living(memory care at [facility])               Home Equipment: None Additional Comments: son reports she would not use any AD as her family and staff encouraged    Prior Function Level of Independence: Needs assistance   Gait / Transfers Assistance Needed: I gait with no AD but many falls in the last year  ADL's / Homemaking Assistance Needed: assisted by ALF staff  Comments: received from calling son      Hand Dominance  Extremity/Trunk Assessment   Upper Extremity Assessment Upper Extremity Assessment: Generalized weakness    Lower Extremity Assessment Lower Extremity Assessment: LLE deficits/detail LLE Deficits / Details: pt has IM nailing on L femur, weak and reluctant to WB LLE Coordination:  decreased fine motor;decreased gross motor    Cervical / Trunk Assessment Cervical / Trunk Assessment: Kyphotic  Communication   Communication: Expressive difficulties(dementia and expressive aphasia)  Cognition Arousal/Alertness: Awake/alert Behavior During Therapy: Anxious Overall Cognitive Status: History of cognitive impairments - at baseline                                        General Comments General comments (skin integrity, edema, etc.): pt is able to pivot with dense cues to the chair but is unsure of her reason to be there, safety features in place    Exercises     Assessment/Plan    PT Assessment Patient needs continued PT services  PT Problem List Decreased strength;Decreased range of motion;Decreased activity tolerance;Decreased balance;Decreased mobility;Decreased coordination;Decreased cognition;Decreased knowledge of use of DME;Decreased safety awareness;Decreased knowledge of precautions;Decreased skin integrity       PT Treatment Interventions DME instruction;Gait training;Functional mobility training;Therapeutic activities;Therapeutic exercise;Balance training;Neuromuscular re-education;Patient/family education    PT Goals (Current goals can be found in the Care Plan section)  Acute Rehab PT Goals Patient Stated Goal: to get to chair PT Goal Formulation: With family(talked with son on the phone) Time For Goal Achievement: 08/15/18 Potential to Achieve Goals: Good    Frequency Min 2X/week   Barriers to discharge Decreased caregiver support has limited staff in memory unit    Co-evaluation PT/OT/SLP Co-Evaluation/Treatment: Yes Reason for Co-Treatment: For patient/therapist safety;To address functional/ADL transfers PT goals addressed during session: Mobility/safety with mobility;Balance;Proper use of DME OT goals addressed during session: Strengthening/ROM       AM-PAC PT "6 Clicks" Mobility  Outcome Measure Help needed turning  from your back to your side while in a flat bed without using bedrails?: A Little Help needed moving from lying on your back to sitting on the side of a flat bed without using bedrails?: A Lot Help needed moving to and from a bed to a chair (including a wheelchair)?: A Lot Help needed standing up from a chair using your arms (e.g., wheelchair or bedside chair)?: A Lot Help needed to walk in hospital room?: Total Help needed climbing 3-5 steps with a railing? : Total 6 Click Score: 11    End of Session Equipment Utilized During Treatment: Gait belt Activity Tolerance: Patient limited by fatigue;Treatment limited secondary to medical complications (Comment) Patient left: in chair;with call bell/phone within reach;with chair alarm set Nurse Communication: Mobility status PT Visit Diagnosis: Unsteadiness on feet (R26.81);Muscle weakness (generalized) (M62.81);History of falling (Z91.81);Difficulty in walking, not elsewhere classified (R26.2)    Time: 7902-4097 PT Time Calculation (min) (ACUTE ONLY): 19 min   Charges:   PT Evaluation $PT Eval Moderate Complexity: 1 Mod         Ivar Drape 08/01/2018, 1:03 PM  Samul Dada, PT MS Acute Rehab Dept. Number: [facility] [id number] and [facility] [phone]

## 2018-08-01 NOTE — Progress Notes (Signed)
SPORTS MEDICINE AND JOINT REPLACEMENT  Georgena Spurling, MD    Laurier Nancy, PA-C [address] [place], [place], [place]  [address]                             [phone]   PROGRESS NOTE  Subjective:  negative for Chest Pain  negative for Shortness of Breath  negative for Nausea/Vomiting   negative for Calf Pain  negative for Bowel Movement   Tolerating Diet: yes         Patient reports pain as 3 on 0-10 scale.    Objective: Vital signs in last 24 hours:    Patient Vitals for the past 24 hrs:  BP Temp Temp src Pulse Resp SpO2 Height  08/01/18 0648 121/80 98.6 F (37 C) Oral 70 - 100 % -  08/01/18 0045 138/61 99.4 F (37.4 C) Axillary 62 - 96 % -  07/31/18 2202 139/63 100 F (37.8 C) Oral 62 - 97 % -  07/31/18 1245 136/82 98.2 F (36.8 C) Oral 77 19 94 % -  07/31/18 1155 - - - 87 (!) 22 97 % -  07/31/18 1147 (!) 161/74 (!) 97 F (36.1 C) - 76 18 96 % -  07/31/18 1145 - - - 83 19 95 % -  07/31/18 1132 (!) 158/70 - - 83 16 96 % -  07/31/18 1130 - - - 83 (!) 21 95 % -  07/31/18 1120 (!) 166/75 - - 83 16 94 % -  07/31/18 1117 (!) 160/84 - - 84 15 96 % -  07/31/18 1115 (!) 166/75 - - 86 19 96 % -  07/31/18 1105 (!) 160/111 - - 82 11 95 % -  07/31/18 1102 - - - 84 (!) 22 95 % -  07/31/18 1100 - - - 84 19 96 % -  07/31/18 1050 (!) 123/101 - - 81 20 100 % -  07/31/18 1048 - 97.9 F (36.6 C) - - - - -  07/31/18 1047 - - - 80 13 100 % -  07/31/18 0844 - - - - - - 5\' 4"  (1.626 m)    @flow {1959:LAST@   Intake/Output from previous day:   03/06 0701 - 03/07 0700 In: 1878.9 [P.O.:420; I.V.:1258.9] Out: 1750 [Urine:1575]   Intake/Output this shift:   No intake/output data recorded.   Intake/Output      03/06 0701 - 03/07 0700 03/07 0701 - 03/08 0700   P.O. 420    I.V. 1258.9    IV Piggyback 200    Total Intake 1878.9    Urine 1575    Blood 175    Total Output 1750    Net +128.9            LABORATORY DATA: Recent Labs    07/30/18 2200 08/01/18 0339  WBC  12.3* 8.6  HGB 10.8* 9.7*  HCT 35.8* 31.0*  PLT 231 196   Recent Labs    07/30/18 2200  NA 139  K 3.8  CL 108  CO2 23  BUN 21  CREATININE 0.69  GLUCOSE 124*  CALCIUM 8.6*   Lab Results  Component Value Date   INR 0.94 02/21/2012   INR 0.9 12/25/2006    Examination:  General appearance: alert, cooperative and no distress Extremities: extremities normal, atraumatic, no cyanosis or edema  Wound Exam: clean, dry, intact   Drainage:  None: wound tissue dry  Motor Exam: Quadriceps and Hamstrings Intact  Sensory Exam: Superficial Peroneal, Deep Peroneal and Tibial normal   Assessment:    1 Day Post-Op  Procedure(s) (LRB): INTRAMEDULLARY (IM) NAIL INTERTROCHANTRIC (Left)  ADDITIONAL DIAGNOSIS:  Principal Problem:   Closed intertrochanteric fracture, left, initial encounter (HCC) Active Problems:   Hypertension   Coronary artery disease   Hypothyroidism   GERD (gastroesophageal reflux disease)   Mixed hyperlipidemia   Dementia without behavioral disturbance (HCC)     Plan: Physical Therapy as ordered Weight Bearing as Tolerated (WBAT)  DVT Prophylaxis:  Lovenox  Patient is restless and trying to get out of bed. Nurse and care team at bedside. Will continue to follow through the weekend but stable from an orthopedic standpoint. Medicine following  Michelle [name] 08/01/2018, 7:55 AM

## 2018-08-01 NOTE — Evaluation (Signed)
Occupational Therapy Evaluation Patient Details Name: Michelle [name] MRN: [id number] DOB: 26-Jul-1927 Today's Date: 08/01/2018    History of Present Illness Pt is an 85 year old woman admitted after fall resulting in L intertrochanteric femur fx, underwent IM nail 07/31/18. PMH: advanced dementia, peripheral neuropathy, CAD, MI, DM, RA, vertigo, hypothyroidism, HLD.   Clinical Impression   Pt ambulated without a device, per son pt with hx of multiple falls. She resides in a memory care facility and is dependent in all ADL with the exception of self feeding. Pt currently requires 2 person assist for all mobility, but was able to take 2 steps and pivot to chair this visit. Recommending SNF upon discharge. Will defer further OT to SNF.    Follow Up Recommendations  SNF;Supervision/Assistance - 24 hour    Equipment Recommendations       Recommendations for Other Services       Precautions / Restrictions Precautions Precautions: Fall Restrictions Weight Bearing Restrictions: Yes LLE Weight Bearing: Weight bearing as tolerated      Mobility Bed Mobility Overal bed mobility: Needs Assistance Bed Mobility: Supine to Sit     Supine to sit: +2 for physical assistance;Total assist     General bed mobility comments: assist for all aspects using bed pad to pivot hips to EOB  Transfers Overall transfer level: Needs assistance Equipment used: Rolling walker (2 wheeled) Transfers: Sit to/from [employer] Sit to Stand: +2 physical assistance;Mod assist Stand pivot transfers: +2 physical assistance;Max assist       General transfer comment: assist to rise and steady, took two steps forward and then stated she could not do any more, pivoted to chair toward her R, tends to sit prematurely    Balance Overall balance assessment: Needs assistance Sitting-balance support: Bilateral upper extremity supported;No upper extremity supported Sitting balance-Leahy Scale: Fair      Standing balance support: Bilateral upper extremity supported Standing balance-Leahy Scale: Poor Standing balance comment: requires B UE and +2 support                           ADL either performed or assessed with clinical judgement   ADL Overall ADL's : Needs assistance/impaired Eating/Feeding: Minimal assistance;Sitting                                   Functional mobility during ADLs: (unable to ambulate) General ADL Comments: pt with baseline dependence in bathing, dressing, toileting, grooming.     Vision Patient Visual Report: No change from baseline Additional Comments: vision appears intact     Perception     Praxis      Pertinent Vitals/Pain Pain Assessment: Faces Faces Pain Scale: No hurt     Hand Dominance     Extremity/Trunk Assessment Upper Extremity Assessment Upper Extremity Assessment: Generalized weakness   Lower Extremity Assessment Lower Extremity Assessment: Defer to PT evaluation   Cervical / Trunk Assessment Cervical / Trunk Assessment: Kyphotic   Communication Communication Communication: Expressive difficulties(due to dementia)   Cognition Arousal/Alertness: Awake/alert Behavior During Therapy: Anxious Overall Cognitive Status: History of cognitive impairments - at baseline                                     General Comments       Exercises  Shoulder Instructions      Home Living Family/patient expects to be discharged to:: Assisted living(Memory Care facility at [facility])                                        Prior Functioning/Environment Level of Independence: Needs assistance  Gait / Transfers Assistance Needed: walks without a device ADL's / Homemaking Assistance Needed: assisted for ADL   Comments: per TC with son Homero Fellers        OT Problem List: Impaired balance (sitting and/or standing);Decreased cognition;Decreased knowledge of use of DME or AE       OT Treatment/Interventions:      OT Goals(Current goals can be found in the care plan section) Acute Rehab OT Goals Patient Stated Goal: pt thanking therapist's for helping her to chair OT Goal Formulation: Patient unable to participate in goal setting  OT Frequency:     Barriers to D/C:            Co-evaluation PT/OT/SLP Co-Evaluation/Treatment: Yes Reason for Co-Treatment: For patient/therapist safety;Necessary to address cognition/behavior during functional activity   OT goals addressed during session: Strengthening/ROM      AM-PAC OT "6 Clicks" Daily Activity     Outcome Measure Help from another person eating meals?: A Little Help from another person taking care of personal grooming?: Total Help from another person toileting, which includes using toliet, bedpan, or urinal?: Total Help from another person bathing (including washing, rinsing, drying)?: Total Help from another person to put on and taking off regular upper body clothing?: Total Help from another person to put on and taking off regular lower body clothing?: Total 6 Click Score: 8   End of Session Equipment Utilized During Treatment: Gait belt;Rolling walker Nurse Communication: Mobility status  Activity Tolerance: Patient tolerated treatment well Patient left: in chair;with call bell/phone within reach;with chair alarm set(mat in front of chair)  OT Visit Diagnosis: Unsteadiness on feet (R26.81);Other symptoms and signs involving cognitive function;Cognitive communication deficit (R41.841)                Time: 6147-0929 OT Time Calculation (min): 20 min Charges:  OT General Charges $OT Visit: 1 Visit OT Evaluation $OT Eval Moderate Complexity: 1 Mod  Martie Round, OTR/L Acute Rehabilitation Services Pager: [phone] Office: [phone]  Evern Bio 08/01/2018, 11:01 AM

## 2018-08-01 NOTE — Clinical Social Work Note (Signed)
Clinical Social Work Assessment  Patient Details  Name: Michelle [name] MRN: [id number] Date of Birth: Mar 03, 1928  Date of referral:  08/01/18               Reason for consult:  Facility Placement, Discharge Planning                Permission sought to share information with:  Facility Medical sales representative, Family Supports Permission granted to share information::  No(pt only oriented to self)  Name::     Blake Divine  Agency::  SNFs  Relationship::  son  Contact Information:  [phone]  Housing/Transportation Living arrangements for the past 2 months:   Facility Source of Information:  Adult Children Patient Interpreter Needed:  None Criminal Activity/Legal Involvement Pertinent to Current Situation/Hospitalization:  No - Comment as needed Significant Relationships:  Adult Children Lives with:  Facility Resident Do you feel safe going back to the place where you live?  Yes Need for family participation in patient care:  Yes (Comment)(decision making; placement)  Care giving concerns:  Pt from ALF, requires skilled care and rehab before returning.   Social Worker assessment / plan:  CSW spoke with pt son Thelma Barge on phone due to pt orientation. Introduced self, role, and reason for call. Pt son confirms pt from [facility]. Pt son is in agreement for SNF, pt has previously been. He would like to speak with Barrett Henle for SNF recommendations before giving permission for fax out.   CSW will prepare referral and await pt family preferences.    Employment status:  Retired Health and safety inspector:  [employer] PT Recommendations:  Skilled Holiday representative, 24 Hour Supervision Information / Referral to community resources:  Skilled Nursing Facility  Patient/Family's Response to care:  Pt son amenable to speaking with CSW. Interested in SNF referral and placement.  Patient/Family's Understanding of and Emotional Response to Diagnosis, Current Treatment, and  Prognosis:  Pt son states understanding of pt diagnosis, current treatment and prognosis. Pt son pleasant and appropriate, is proactive to meet pt needs.  Emotional Assessment Appearance:  Appears stated age Attitude/Demeanor/Rapport:  Unable to Assess Affect (typically observed):  Unable to Assess Orientation:  Oriented to Self, Fluctuating Orientation (Suspected and/or reported Sundowners) Alcohol / Substance use:  Not Applicable Psych involvement (Current and /or in the community):  No (Comment)  Discharge Needs  Concerns to be addressed:  Care Coordination Readmission within the last 30 days:  No Current discharge risk:  Dependent with Mobility, Cognitively Impaired Barriers to Discharge:  Continued Medical Work up   [employer], LCSWA 08/01/2018, 3:31 PM

## 2018-08-01 NOTE — NC FL2 (Signed)
[place] MEDICAID FL2 LEVEL OF CARE SCREENING TOOL     IDENTIFICATION  Patient Name: Michelle [name] Birthdate: 09/27/1927 Sex: female Admission Date (Current Location): 07/30/2018  [facility] and [place] Number:  Producer, television/film/video and Address:  [facility], [address], [place], [place] [address]      Provider Number: [id number]  Attending Physician Name and Address:  Myrtie Neither, MD  Relative Name and Phone Number:  Michelle [name], son,   [phone]  Current Level of Care: Hospital Recommended Level of Care: Skilled Nursing Facility Prior Approval Number:    Date Approved/Denied:   PASRR Number: [id number]  Discharge Plan: SNF    Current Diagnoses: Patient Active Problem List   Diagnosis Date Noted  . Closed intertrochanteric fracture, left, initial encounter (HCC) 07/30/2018  . Hypothermia 05/31/2018  . Dementia without behavioral disturbance (HCC) 05/31/2018  . Elevated troponin 05/31/2018  . Osteoarthritis 06/26/2017  . Primary progressive aphasia (HCC) 07/30/2015  . Mixed hyperlipidemia 05/04/2013  . Hypertension   . Coronary artery disease   . Hypothyroidism   . GERD (gastroesophageal reflux disease)   . Arthritis   . Myocardial infarction (HCC)   . Osteoarthritis of right knee 02/26/2012    Orientation RESPIRATION BLADDER Height & Weight     Self  Normal Continent, External catheter Weight:   Height:  5\' 4"  (162.6 cm)  BEHAVIORAL SYMPTOMS/MOOD NEUROLOGICAL BOWEL NUTRITION STATUS      Continent Diet(see discharge summary)  AMBULATORY STATUS COMMUNICATION OF NEEDS Skin   Extensive Assist Verbally Surgical wounds(incision on left leg with hydrocolloid)                       Personal Care Assistance Level of Assistance  Bathing, Feeding, Dressing Bathing Assistance: Maximum assistance Feeding assistance: Limited assistance Dressing Assistance: Maximum assistance     Functional Limitations Info  Sight, Hearing,  Speech Sight Info: Adequate Hearing Info: Adequate Speech Info: Adequate    SPECIAL CARE FACTORS FREQUENCY  PT (By licensed PT), OT (By licensed OT)     PT Frequency: 5x week OT Frequency: 5x week            Contractures Contractures Info: Not present    Additional Factors Info  Code Status, Allergies, Psychotropic Code Status Info: DNR Allergies Info: No Known Allergies Psychotropic Info: busPIRone (BUSPAR) tablet 15 mg 3x daily PO; sertraline (ZOLOFT) tablet 100 mg daily PO         Current Medications (08/01/2018):  This is the current hospital active medication list Current Facility-Administered Medications  Medication Dose Route Frequency Provider Last Rate Last Dose  . 0.9 %  sodium chloride infusion   Intravenous Continuous Despina Hidden, PA-C 75 mL/hr at 08/01/18 0700    . acetaminophen (TYLENOL) tablet 650 mg  650 mg Oral Q6H Ulyses Southward A, PA-C   650 mg at 08/01/18 1307  . busPIRone (BUSPAR) tablet 15 mg  15 mg Oral TID Ulyses Southward A, PA-C   15 mg at 08/01/18 1008  . enoxaparin (LOVENOX) injection 40 mg  40 mg Subcutaneous Q24H Ulyses Southward A, PA-C   40 mg at 08/01/18 1007  . HYDROcodone-acetaminophen (NORCO/VICODIN) 5-325 MG per tablet 1-2 tablet  1-2 tablet Oral Q6H PRN Despina Hidden, PA-C      . lactated ringers infusion   Intravenous Continuous Despina Hidden, PA-C 10 mL/hr at 07/31/18 3532    . morphine 2 MG/ML injection 2 mg  2 mg  Intravenous Q2H PRN Despina Hidden, PA-C      . ondansetron [facility]) tablet 4 mg  4 mg Oral Q6H PRN Despina Hidden, PA-C       Or  . ondansetron [facility]) injection 4 mg  4 mg Intravenous Q6H PRN Despina Hidden, PA-C      . sertraline (ZOLOFT) tablet 100 mg  100 mg Oral Daily Ulyses Southward A, PA-C   100 mg at 08/01/18 1009     Discharge Medications: Please see discharge summary for a list of discharge medications.  Relevant Imaging Results:  Relevant Lab Results:   Additional Information SS#[id number] [place], [place]

## 2018-08-02 LAB — BASIC METABOLIC PANEL
Anion gap: 6 (ref 5–15)
BUN: 11 mg/dL (ref 8–23)
CO2: 22 mmol/L (ref 22–32)
Calcium: 8 mg/dL — ABNORMAL LOW (ref 8.9–10.3)
Chloride: 110 mmol/L (ref 98–111)
Creatinine, Ser: 0.58 mg/dL (ref 0.44–1.00)
GFR calc non Af Amer: >60 mL/min (ref >60)
Glucose, Bld: 121 mg/dL — ABNORMAL HIGH (ref 70–99)
Potassium: 3.4 mmol/L — ABNORMAL LOW (ref 3.5–5.1)
Sodium: 138 mmol/L (ref 135–145)

## 2018-08-02 MED ORDER — SODIUM CHLORIDE 0.9 % IV BOLUS
250.0000 mL | Freq: Once | INTRAVENOUS | Status: AC
  Administered 2018-08-02: 250 mL via INTRAVENOUS

## 2018-08-02 MED ORDER — METOPROLOL TARTRATE 5 MG/5ML IV SOLN
5.0000 mg | INTRAVENOUS | Status: AC | PRN
  Administered 2018-08-02 (×2): 5 mg via INTRAVENOUS
  Filled 2018-08-02 (×2): qty 5

## 2018-08-02 MED ORDER — METOPROLOL TARTRATE 5 MG/5ML IV SOLN
5.0000 mg | Freq: Once | INTRAVENOUS | Status: AC
  Administered 2018-08-02: 5 mg via INTRAVENOUS
  Filled 2018-08-02: qty 5

## 2018-08-02 NOTE — Progress Notes (Signed)
Rounded on Pt, found her agitated, restless, noted facial grimacing and stated she is pain, vital signs taken and recorded, PRN pain med given, HR sustained above 130 but soft BP, MD notified. Obtained verbal order for 250cc NS bolus, gave PRN metoprolol. Stayed with Pt for , rechecked at 11:30, Pt alert, oriented to herself, appears to be relaxed. HR still at 130, MD notified, awaiting orders. Will continue to monitor.

## 2018-08-02 NOTE — Progress Notes (Signed)
SPORTS MEDICINE AND JOINT REPLACEMENT  Georgena Spurling, MD    Laurier Nancy, PA-C [address] [place], [place], [place]  [address]                             [phone]   PROGRESS NOTE  Subjective:  Patient seen at bedside and was sleeping. When woken up she was very agitated and went back to sleep.   Objective: Vital signs in last 24 hours:    Patient Vitals for the past 24 hrs:  BP Temp Temp src Pulse Resp SpO2  08/02/18 0529 (!) 86/75 98.1 F (36.7 C) Axillary (!) 109 - 93 %  08/02/18 0042 (!) 149/101 - - (!) 150 - -  08/01/18 2045 112/80 98.2 F (36.8 C) Axillary 66 - 91 %  08/01/18 1406 (!) 123/55 (!) 97.3 F (36.3 C) Oral 71 16 97 %    @flow {1959:LAST@   Intake/Output from previous day:   03/07 0701 - 03/08 0700 In: 1670 [P.O.:720; I.V.:950] Out: 1400 [Urine:1400]   Intake/Output this shift:   No intake/output data recorded.   Intake/Output      03/07 0701 - 03/08 0700 03/08 0701 - 03/09 0700   P.O. 720    I.V. 950    IV Piggyback     Total Intake 1670    Urine 1400    Blood     Total Output 1400    Net +270         Urine Occurrence 1 x       LABORATORY DATA: Recent Labs    07/30/18 2200 08/01/18 0339  WBC 12.3* 8.6  HGB 10.8* 9.7*  HCT 35.8* 31.0*  PLT 231 196   Recent Labs    07/30/18 2200 08/02/18 0524  NA 139 138  K 3.8 3.4*  CL 108 110  CO2 23 22  BUN 21 11  CREATININE 0.69 0.58  GLUCOSE 124* 121*  CALCIUM 8.6* 8.0*   Lab Results  Component Value Date   INR 0.94 02/21/2012   INR 0.9 12/25/2006    Examination:  General appearance: alert, cooperative and no distress Extremities: extremities normal, atraumatic, no cyanosis or edema  Wound Exam: clean, dry, intact   Drainage:  None: wound tissue dry  Motor Exam: Quadriceps and Hamstrings Intact  Sensory Exam: Superficial Peroneal, Deep Peroneal and Tibial normal   Assessment:    2 Days Post-Op  Procedure(s) (LRB): INTRAMEDULLARY (IM) NAIL INTERTROCHANTRIC  (Left)  ADDITIONAL DIAGNOSIS:  Principal Problem:   Closed intertrochanteric fracture, left, initial encounter (HCC) Active Problems:   Hypertension   Coronary artery disease   Hypothyroidism   GERD (gastroesophageal reflux disease)   Mixed hyperlipidemia   Dementia without behavioral disturbance (HCC)     Plan: Physical Therapy as ordered Weight Bearing as Tolerated (WBAT)  DVT Prophylaxis:  Lovenox  Ok to discharge from ortho standpoint. Awaiting SNF placement  Guy Sandifer 08/02/2018, 9:16 AM

## 2018-08-02 NOTE — Progress Notes (Signed)
Pt noted AAOX1.  As the night shift continued pt  More restless and agitated.  Frequently mumbling incoherent words and gesturing. Noted increase in HR 130-150's sustaining. Text out to covering MD Blount. New orders for lopressor 5 mg IV for HR sustaining above 120's. Call out to [facility] tele monitor for significant changes after Lopressor given. HR decreased to 114 for a short time. Additional text since to covering MD regarding HR increase to 130's. New orders given. See mar. Second lopressor push adm. Noted HR per tele monitor. 109 Afib . Pt finally resting and asleep. Bed exit alarm maintained, and call light and phone in which. Rounding completed per MD orders and unit protocol. L hip site warm to touch, dressing C/D/I, ice applied.

## 2018-08-02 NOTE — Progress Notes (Signed)
Rounded @15 :35, Pt alert, confused, HR at 131, BP 113/77, MD updated. Will continue to monitor.

## 2018-08-02 NOTE — Plan of Care (Signed)
  Problem: Physical Regulation: Goal: Postoperative complications will be avoided or minimized Outcome: Progressing   Problem: Skin Integrity: Goal: Signs of wound healing will improve Outcome: Progressing   Problem: Clinical Measurements: Goal: Ability to maintain clinical measurements within normal limits will improve Outcome: Progressing   Problem: Activity: Goal: Risk for activity intolerance will decrease Outcome: Progressing   Problem: Nutrition: Goal: Adequate nutrition will be maintained Outcome: Progressing   Problem: Elimination: Goal: Will not experience complications related to bowel motility Outcome: Progressing   Problem: Pain Managment: Goal: General experience of comfort will improve Outcome: Progressing   Problem: Safety: Goal: Ability to remain free from injury will improve Outcome: Progressing   Problem: Skin Integrity: Goal: Risk for impaired skin integrity will decrease Outcome: Progressing

## 2018-08-02 NOTE — Progress Notes (Signed)
PROGRESS NOTE  BRIANNE SOBIE VPX:[id number] DOB: 1927-09-01 DOA: 07/30/2018 PCP: Ralene Ok, MD  HPI/Recap of past 20 hours: 85 year old female with past medical history of hypertension coronary artery disease her thyroidism GERD hyperlipidemia and dementia who lives in a nursing home was brought from nursing home because of fall.  Found to have left inner trochanteric fracture   Subjective: Patient seen and examined at bedside her son Homero Fellers is at the room.  Patient seems agitated and later talking out of her head making normal senseless conversation with her son.  Subjective: August 01, 2018. Patient seen and examined at bedside.  Nurse reported patient was slightly more agitated and almost combative  Subjective: August 02, 2018: Patient seen and examined at bedside she is lying in bed patient was reported to be agitated and in pain as well as having heart rates in the 130s.  She was given morphine with improvement of her pain but she continued to have elevated heart rate.  She was given metoprolol 5 mg IV with a bolus of 250 mils of normal saline due to soft blood pressure of 97/71.  Assessment/Plan: Principal Problem:   Closed intertrochanteric fracture, left, initial encounter [facility]) Active Problems:   Hypertension   Coronary artery disease   Hypothyroidism   GERD (gastroesophageal reflux disease)   Mixed hyperlipidemia   Dementia without behavioral disturbance (HCC)   1.  Fall at the nursing facility  2.  Left intertrochanteric fracture status post intramedullary nailing of left intertrochanteric femur fracture.  Patient is getting physical therapy waiting for transfer to her nursing home facility  3.  Dementia which is worsening by most likely anesthesia and recent surgery..  Patient continues to be agitated may be behavioral related dementia but I will check a UA to make sure she is not having any urinary tract infection since this is out of her normal also check other  electrolytes  4.  Coronary artery disease status post stent 2008 LCx stable continue statins  5.Hypothyroidism continue levothyroxine  6.Hypertension continue home meds  7.GERD continue PPI  8.Hyperlipidemia continue statin  9.  Anemia could be due to blood loss status post surgery will monitor  10.  Tachycardia sustained at 130 patient received IV metoprolol 5 mg.  Blood pressure was slightly low and was given bolus of 250 mls of normal saline .  It took a little while for theheart rate to normalize.  Patient converted into sinus rhythm and blood pressure is now good at 113/75.  Also she had transient  lowering of her  heart rate in the 30s prior to conversion.  She is now maintaining heart rate of about 70 to 80/min.  Morphine was given for pain as well     Code Status: DNR  Severity of Illness: The appropriate patient status for this patient is INPATIENT. Inpatient status is judged to be reasonable and necessary in order to provide the required intensity of service to ensure the patient's safety. The patient's presenting symptoms, physical exam findings, and initial radiographic and laboratory data in the context of their chronic comorbidities is felt to place them at high risk for further clinical deterioration. Furthermore, it is not anticipated that the patient will be medically stable for discharge from the hospital within 2 midnights of admission. The following factors support the patient status of inpatient.   " The patient's presenting symptoms include fall with fracture left intertrochanteric fracture patient is status post fixation patient needs continued stay in the hospital for  overnight monitoring "  * I certify that at the point of admission it is my clinical judgment that the patient will require inpatient hospital care spanning beyond 2 midnights from the point of admission due to high intensity of service, high risk for further deterioration and high frequency of  surveillance required.*    Family Communication: non at bedside  Disposition Plan: Back to nursing [facility]   Consultants:  Orthopedic Dr. Jena GaussHaddix  Procedures:  cephalo-medullary nailing of left intertrochanteric femoral fracture, July 31, 2018  Antimicrobials:  Preop Ancef 2 g  DVT prophylaxis: sCD   Objective: Vitals:   08/02/18 0529 08/02/18 0922 08/02/18 1015 08/02/18 1515  BP: (!) 86/75 (!) 94/54 93/71 113/77  Pulse: (!) 109 92 (!) 131 (!) 116  Resp:  16 16 16   Temp: 98.1 F (36.7 C) 97.7 F (36.5 C) 97.7 F (36.5 C) 98 F (36.7 C)  TempSrc: Axillary Oral Oral Oral  SpO2: 93% 98% 97% 94%  Height:        Intake/Output Summary (Last 24 hours) at 08/02/2018 1904 Last data filed at 08/02/2018 1500 Gross per 24 hour  Intake 2226.97 ml  Output 1100 ml  Net 1126.97 ml   There were no vitals filed for this visit. Body mass index is 24.91 kg/m.  Exam:  . General: 85 y.o. year-old female well developed well nourished in no acute distress.  Alert and oriented x1 patient is pleasant and calm at this time of my examination . Cardiovascular: Regular rate and rhythm with no rubs or gallops.  No thyromegaly or JVD noted.   Marland Kitchen. Respiratory: Clear to auscultation with no wheezes or rales. Good inspiratory effort. . Abdomen: Soft nontender nondistended with normal bowel sounds x4 quadrants. . Musculoskeletal: No lower extremity edema. 2/4 pulses in all 4 extremities. . Skin: No ulcerative lesions noted or rashes, Psychiatry: Mood is appropriate for condition and setting patient is slightly confused not agitated at this moment she is watching TV and, making good conversation but is mostly gibberish and understandable.  But at one time she was smiling as she was holding the conversation with me and then she stated that "you look beautiful"   data Reviewed: CBC: Recent Labs  Lab 07/30/18 2200 08/01/18 0339  WBC 12.3* 8.6  NEUTROABS 10.3*  --   HGB 10.8*  9.7*  HCT 35.8* 31.0*  MCV 96.5 90.4  PLT 231 196   Basic Metabolic Panel: Recent Labs  Lab 07/30/18 2200 08/02/18 0524  NA 139 138  K 3.8 3.4*  CL 108 110  CO2 23 22  GLUCOSE 124* 121*  BUN 21 11  CREATININE 0.69 0.58  CALCIUM 8.6* 8.0*   GFR: CrCl cannot be calculated (Unknown ideal weight.). Liver Function Tests: Recent Labs  Lab 07/30/18 2200  AST 19  ALT 16  ALKPHOS 94  BILITOT 0.8  PROT 6.7  ALBUMIN 3.7   No results for input(s): LIPASE, AMYLASE in the last 168 hours. No results for input(s): AMMONIA in the last 168 hours. Coagulation Profile: No results for input(s): INR, PROTIME in the last 168 hours. Cardiac Enzymes: No results for input(s): CKTOTAL, CKMB, CKMBINDEX, TROPONINI in the last 168 hours. BNP (last 3 results) No results for input(s): PROBNP in the last 8760 hours. HbA1C: No results for input(s): HGBA1C in the last 72 hours. CBG: Recent Labs  Lab 07/31/18 1048  GLUCAP 151*   Lipid Profile: No results for input(s): CHOL, HDL, LDLCALC, TRIG, CHOLHDL, LDLDIRECT in the  last 72 hours. Thyroid Function Tests: No results for input(s): TSH, T4TOTAL, FREET4, T3FREE, THYROIDAB in the last 72 hours. Anemia Panel: No results for input(s): VITAMINB12, FOLATE, FERRITIN, TIBC, IRON, RETICCTPCT in the last 72 hours. Urine analysis:    Component Value Date/Time   COLORURINE YELLOW 11/30/2017 1420   APPEARANCEUR CLEAR 11/30/2017 1420   LABSPEC 1.028 11/30/2017 1420   PHURINE 5.0 11/30/2017 1420   GLUCOSEU NEGATIVE 11/30/2017 1420   HGBUR NEGATIVE 11/30/2017 1420   BILIRUBINUR NEGATIVE 11/30/2017 1420   KETONESUR 5 (A) 11/30/2017 1420   PROTEINUR NEGATIVE 11/30/2017 1420   UROBILINOGEN 1.0 03/12/2012 1739   NITRITE NEGATIVE 11/30/2017 1420   LEUKOCYTESUR NEGATIVE 11/30/2017 1420   Sepsis Labs: @LABRCNTIP (procalcitonin:4,lacticidven:4)  ) Recent Results (from the past 240 hour(s))  MRSA PCR Screening     Status: None   Collection Time:  07/31/18  6:21 AM  Result Value Ref Range Status   MRSA by PCR NEGATIVE NEGATIVE Final    Comment:        The GeneXpert MRSA Assay (FDA approved for NASAL specimens only), is one component of a comprehensive MRSA colonization surveillance program. It is not intended to diagnose MRSA infection nor to guide or monitor treatment for MRSA infections. Performed at [facility], [address], [place], [place] [address]   Surgical pcr screen     Status: None   Collection Time: 07/31/18  8:10 AM  Result Value Ref Range Status   MRSA, PCR NEGATIVE NEGATIVE Final   Staphylococcus aureus NEGATIVE NEGATIVE Final    Comment: (NOTE) The Xpert SA Assay (FDA approved for NASAL specimens in patients 45 years of age and older), is one component of a comprehensive surveillance program. It is not intended to diagnose infection nor to guide or monitor treatment. Performed at [facility], [address], [place], [place] [address]       Studies: No results found.  Scheduled Meds: . acetaminophen  650 mg Oral Q6H  . busPIRone  15 mg Oral TID  . donepezil  5 mg Oral QHS  . enoxaparin (LOVENOX) injection  40 mg Subcutaneous Q24H  . sertraline  100 mg Oral Daily    Continuous Infusions: . sodium chloride 75 mL/hr at 08/02/18 1518  . lactated ringers 10 mL/hr at 07/31/18 0906     LOS: 3 days     Myrtie Neither, MD [facility]  To reach me or the doctor on call, go to: [email or web address] Password [facility]  08/02/2018, 7:04 PM

## 2018-08-03 ENCOUNTER — Encounter (HOSPITAL_COMMUNITY): Payer: Self-pay | Admitting: Student

## 2018-08-03 DIAGNOSIS — E039 Hypothyroidism, unspecified: Secondary | ICD-10-CM

## 2018-08-03 DIAGNOSIS — I25119 Atherosclerotic heart disease of native coronary artery with unspecified angina pectoris: Secondary | ICD-10-CM

## 2018-08-03 DIAGNOSIS — I10 Essential (primary) hypertension: Secondary | ICD-10-CM

## 2018-08-03 DIAGNOSIS — E782 Mixed hyperlipidemia: Secondary | ICD-10-CM

## 2018-08-03 DIAGNOSIS — F0390 Unspecified dementia without behavioral disturbance: Secondary | ICD-10-CM

## 2018-08-03 LAB — BASIC METABOLIC PANEL
Anion gap: 8 (ref 5–15)
BUN: 13 mg/dL (ref 8–23)
CALCIUM: 8 mg/dL — AB (ref 8.9–10.3)
CO2: 23 mmol/L (ref 22–32)
Chloride: 109 mmol/L (ref 98–111)
Creatinine, Ser: 0.66 mg/dL (ref 0.44–1.00)
GFR calc Af Amer: >60 mL/min (ref >60)
GFR calc non Af Amer: >60 mL/min (ref >60)
Glucose, Bld: 104 mg/dL — ABNORMAL HIGH (ref 70–99)
Potassium: 3.3 mmol/L — ABNORMAL LOW (ref 3.5–5.1)
Sodium: 140 mmol/L (ref 135–145)

## 2018-08-03 LAB — HEMOGLOBIN AND HEMATOCRIT, BLOOD
HCT: 26.2 % — ABNORMAL LOW (ref 36.0–46.0)
Hemoglobin: 8.3 g/dL — ABNORMAL LOW (ref 12.0–15.0)

## 2018-08-03 LAB — MAGNESIUM: Magnesium: 1.7 mg/dL (ref 1.7–2.4)

## 2018-08-03 MED ORDER — HYDROCODONE-ACETAMINOPHEN 5-325 MG PO TABS: 1.0000 | ORAL_TABLET | Freq: Four times a day (QID) | ORAL | 0 refills | Status: DC | PRN

## 2018-08-03 MED ORDER — POTASSIUM CHLORIDE CRYS ER 20 MEQ PO TBCR
40.0000 meq | EXTENDED_RELEASE_TABLET | Freq: Once | ORAL | Status: AC
  Administered 2018-08-03: 40 meq via ORAL
  Filled 2018-08-03: qty 2

## 2018-08-03 NOTE — Progress Notes (Signed)
Orthopaedic Trauma Progress Note  S: patient doing well this morning, does not appear agitated. Soft blood pressures overnight.  Waiting for transfer back to nursing facility  O:  Vitals:   08/02/18 1938 08/03/18 0646  BP: (!) 99/56 (!) 141/61  Pulse: 67 65  Resp:  16  Temp: 98.5 F (36.9 C) 98.2 F (36.8 C)  SpO2: 96% 98%   General - laying in bed comfortably, NAD LLE - dressing in place are clean, dry, intact. Minimal tenderness to palpation of thigh. Full knee range of motion without significant discomfort. Sensory and motor fucntion grossly intact, Neurovascularly intact  Imaging: stable  Labs:  Results for orders placed or performed during the hospital encounter of 07/30/18 (from the past 24 hour(s))  Basic metabolic panel     Status: Abnormal   Collection Time: 08/03/18  7:08 AM  Result Value Ref Range   Sodium 140 135 - 145 mmol/L   Potassium 3.3 (L) 3.5 - 5.1 mmol/L   Chloride 109 98 - 111 mmol/L   CO2 23 22 - 32 mmol/L   Glucose, Bld 104 (H) 70 - 99 mg/dL   BUN 13 8 - 23 mg/dL   Creatinine, Ser 7.06 0.44 - 1.00 mg/dL   Calcium 8.0 (L) 8.9 - 10.3 mg/dL   GFR calc non Af Amer >60 >60 mL/min   GFR calc Af Amer >60 >60 mL/min   Anion gap 8 5 - 15  Hemoglobin and hematocrit, blood     Status: Abnormal   Collection Time: 08/03/18  7:08 AM  Result Value Ref Range   Hemoglobin 8.3 (L) 12.0 - 15.0 g/dL   HCT 23.7 (L) 62.8 - 31.5 %    Assessment: 85 year old female s/p mechanical fall  Injuries: left intertrochanteric femur fracture s/p cephalomedullary nailing on 07/31/18  Weightbearing: WBAT LLE   Insicional and dressing care: change dressings as needed   Orthopedic device(s): none   CV/Blood loss: Hgb 8.3 this morning, hemodynamically stable  Pain management: 1. Tylenol 650 mg q 6 hours 2. Norco 5/325 q 6 hours PRN 3. Morphine 2 mg q 2 hours PRN  VTE prophylaxis: Lovenox 40 mg daily  ID: Ancef post op completed  Foley/Lines: No foley, KVO IVFs  Medical  co-morbidities: Dementia, Hypothyroidism, HTN, GERD, hyperlipidemia, CAD   Dispo: awaiting transfer to nursing facility  Follow - up plan: 2 weeks     Clelia Trabucco A. [facility] ?[phone]? (phone)

## 2018-08-03 NOTE — Discharge Summary (Addendum)
Physician Discharge Summary  Michelle [name] ZOX:[id number] DOB: 1928/05/18 DOA: 07/30/2018  PCP: Ralene OkMoreira, Roy, MD  Admit date: 07/30/2018 Discharge date: 08/04/2018  Time spent: 45 minutes  Recommendations for Outpatient Follow-up:  Patient will be discharged to skilled nursing facility, continue physical and occupational therapy.  Patient will need to follow up with primary care provider within one week of discharge.  Follow up with orthopedics in 2 weeks. Patient should continue medications as prescribed.  Patient should follow a heart healthy diet.   Discharge Diagnoses:  Principal Problem:  Closed intertrochanteric fracture, left, initial encounter (HCC) Dementia Hypokalemia Coronary artery disease Hypothyroidism Essential hypertension GERD Hyperlipidemia Tachycardia Normocytic Anemia  Discharge Condition: stable  Diet recommendation: heart healthy  There were no vitals filed for this visit.  History of present illness:  On 07/30/2018 by Dr. Earlie LouGarba Mohammad Michelle FaesJoan T Lubke is a 85 y.o. female with medical history significant of dementia, progressive aphasia, coronary artery disease, hypertension,Hyperlipidemia, hypothyroidism brought in from SNF after sustaining a fall last Saturday and another one tonight. History obtained from Son due to Dementia. Patient having pain in her left hip which is persistent. Not able to walk without pain. Was using a walker since last Saturday but not improved. She was seen and evaluated in the ER and found to have left Intertrochanteric fracture with no significant angulation.  Orthopedic consulted and recommends surgery probably as early as tomorrow.  She is to be admitted to the medical service and Ortho will consult.Marland Kitchen.  Hospital Course:  Left intertrochanteric fracture  -Secondary to fall -Orthopedic surgery consulted and appreciated, status post Cephalomedullary nailing of left intertrochanteric femur fracture -Orthopedic surgery will follow up with  patient in 2 weeks -Continue pain management -PT, OT recommending SNF  Dementia -Currently does not appear to be agitated -Per previous documentation, patient did have some worsening which is possibly secondary to anesthesia and recent surgery -was placed on Aricept- patient to follow up with PCP   Hypokalemia -replaced, repeat BMP in one week  Coronary artery disease -Status post stent placement in 2008 left circumflex -Continue statin  Hypothyroidism -Continue Synthroid  Essential hypertension -stable, on no meds  GERD -Continue PPI  Hyperlipidemia -Continue statin  Tachycardia -Possibly secondary to pain -resolved  Normocytic Anemia -Suspect secondary to recent surgery, hemoglobin 8.3 -repeat CBC in one week  Procedures: Cephalomedullary nailing of left intertrochanteric femur fracture  Consultations Orthopedic surgery, Dr. Jena GaussHaddix  Discharge Exam: Vitals:   08/03/18 0646 08/03/18 1438  BP: (!) 141/61 130/65  Pulse: 65 71  Resp: 16 16  Temp: 98.2 F (36.8 C) 98.5 F (36.9 C)  SpO2: 98% 94%     General: Well developed, well nourished, NAD, appears stated age  HEENT: NCAT, mucous membranes moist.  Cardiovascular: S1 S2 auscultated,RRR  Respiratory: Clear to auscultation bilaterally with equal chest rise  Abdomen: Soft, nontender, nondistended, + bowel sounds  Extremities: warm dry without cyanosis clubbing or edema  Neuro: awake and alert, dementia  Psych: pleasantly confused  Discharge Instructions Discharge Instructions    Discharge instructions   Complete by:  As directed    Patient will be discharged to skilled nursing facility, continue physical and occupational therapy.  Patient will need to follow up with primary care provider within one week of discharge.  Follow up with orthopedics in 2 weeks. Patient should continue medications as prescribed.  Patient should follow a heart healthy diet.     Allergies as of 08/03/2018   No Known  Allergies  Medication List    TAKE these medications   acetaminophen 325 MG tablet Commonly known as:  TYLENOL Take 650 mg by mouth every 6 (six) hours as needed for moderate pain.   busPIRone 15 MG tablet Commonly known as:  BUSPAR Take 15 mg by mouth 3 (three) times daily.   HYDROcodone-acetaminophen 5-325 MG tablet Commonly known as:  NORCO/VICODIN Take 1-2 tablets by mouth every 6 (six) hours as needed for moderate pain or severe pain.   sertraline 100 MG tablet Commonly known as:  ZOLOFT Take 100 mg by mouth daily.      No Known Allergies Follow-up Information    Haddix, Gillie Manners, MD. Schedule an appointment as soon as possible for a visit in 2 week(s).   Specialty:  Orthopedic Surgery Contact information: [address] [place] [address] [phone]        Ralene Ok, MD. Schedule an appointment as soon as possible for a visit in 1 week(s).   Specialty:  Internal Medicine Why:  Hospital follow up Contact information: [address] [place] [address] [phone]            The results of significant diagnostics from this hospitalization (including imaging, microbiology, ancillary and laboratory) are listed below for reference.    Significant Diagnostic Studies: Dg Chest 1 View  Result Date: 07/30/2018 CLINICAL DATA:  Preop after fall.  Baseline dementia. EXAM: CHEST  1 VIEW COMPARISON:  05/31/2018 FINDINGS: The lung volumes are low similar in appearance to prior. There is stable cardiomegaly with aortic atherosclerosis. Central pulmonary vascular congestion and mild interstitial edema is noted though less prominent than on prior. No apparent pulmonary consolidation to suggest pneumonia. No effusion or pneumothorax. No acute osseous abnormality. IMPRESSION: Low lung volumes with mild interstitial edema. Stable cardiomegaly with aortic atherosclerosis. Electronically Signed   By: Tollie Eth M.D.   On: 07/30/2018 21:42   Ct Head Wo  Contrast  Result Date: 07/30/2018 CLINICAL DATA:  Dementia patient post unwitnessed fall. Head trauma, ataxia; C-spine trauma, high clinical risk (NEXUS/CCR) EXAM: CT HEAD WITHOUT CONTRAST CT CERVICAL SPINE WITHOUT CONTRAST TECHNIQUE: Multidetector CT imaging of the head and cervical spine was performed following the standard protocol without intravenous contrast. Multiplanar CT image reconstructions of the cervical spine were also generated. COMPARISON:  Head CT 05/31/2018, head and cervical spine CT 11/30/2017 FINDINGS: CT HEAD FINDINGS Brain: Unchanged atrophy and chronic small vessel ischemia. No intracranial hemorrhage, mass effect, or midline shift. No hydrocephalus. The basilar cisterns are patent. No evidence of territorial infarct or acute ischemia. No extra-axial or intracranial fluid collection. Vascular: Atherosclerosis of skullbase vasculature without hyperdense vessel or abnormal calcification. Skull: No fracture or focal lesion. Sinuses/Orbits: Paranasal sinuses and mastoid air cells are clear. The visualized orbits are unremarkable. Other: None. CT CERVICAL SPINE FINDINGS Alignment: No traumatic subluxation. Trace anterolisthesis of C7 on T1 is unchanged from prior exam and likely degenerative. Skull base and vertebrae: No acute fracture. Vertebral body heights are maintained. The dens and skull base are intact. Soft tissues and spinal canal: No prevertebral fluid or swelling. No visible canal hematoma. Disc levels: Diffuse disc space narrowing and endplate spurring. Scattered facet arthropathy. Degenerative changes are similar to prior exam. Upper chest: Patulous upper esophagus. Other: None. IMPRESSION: 1. No acute intracranial abnormality. No skull fracture. Stable atrophy and chronic small vessel ischemia. 2. Multilevel degenerative change in the cervical spine without acute fracture or subluxation. Electronically Signed   By: Narda Rutherford M.D.   On: 07/30/2018  22:06   Ct Cervical Spine Wo  Contrast  Result Date: 07/30/2018 CLINICAL DATA:  Dementia patient post unwitnessed fall. Head trauma, ataxia; C-spine trauma, high clinical risk (NEXUS/CCR) EXAM: CT HEAD WITHOUT CONTRAST CT CERVICAL SPINE WITHOUT CONTRAST TECHNIQUE: Multidetector CT imaging of the head and cervical spine was performed following the standard protocol without intravenous contrast. Multiplanar CT image reconstructions of the cervical spine were also generated. COMPARISON:  Head CT 05/31/2018, head and cervical spine CT 11/30/2017 FINDINGS: CT HEAD FINDINGS Brain: Unchanged atrophy and chronic small vessel ischemia. No intracranial hemorrhage, mass effect, or midline shift. No hydrocephalus. The basilar cisterns are patent. No evidence of territorial infarct or acute ischemia. No extra-axial or intracranial fluid collection. Vascular: Atherosclerosis of skullbase vasculature without hyperdense vessel or abnormal calcification. Skull: No fracture or focal lesion. Sinuses/Orbits: Paranasal sinuses and mastoid air cells are clear. The visualized orbits are unremarkable. Other: None. CT CERVICAL SPINE FINDINGS Alignment: No traumatic subluxation. Trace anterolisthesis of C7 on T1 is unchanged from prior exam and likely degenerative. Skull base and vertebrae: No acute fracture. Vertebral body heights are maintained. The dens and skull base are intact. Soft tissues and spinal canal: No prevertebral fluid or swelling. No visible canal hematoma. Disc levels: Diffuse disc space narrowing and endplate spurring. Scattered facet arthropathy. Degenerative changes are similar to prior exam. Upper chest: Patulous upper esophagus. Other: None. IMPRESSION: 1. No acute intracranial abnormality. No skull fracture. Stable atrophy and chronic small vessel ischemia. 2. Multilevel degenerative change in the cervical spine without acute fracture or subluxation. Electronically Signed   By: Narda Rutherford M.D.   On: 07/30/2018 22:06   Dg C-arm 1-60  Min  Result Date: 07/31/2018 CLINICAL DATA:  INTRAMEDULLARY (IM) NAIL INTERTROCHANTRIC - LEFT Dr. Lytle Michaels cone OR room 3 1 minute 20 seconds fluoro time 5 images and dose summary saved to PACS EXAM: OPERATIVE LEFT HIP (WITH PELVIS IF PERFORMED) 5 VIEWS TECHNIQUE: Fluoroscopic spot image(s) were submitted for interpretation post-operatively. COMPARISON:  None FINDINGS: Images are performed prior to and during placement of lag screw and intramedullary nail across it has intertrochanteric fracture. No dislocation or interval fracture. IMPRESSION: ORIF of the left hip. Electronically Signed   By: Norva Pavlov M.D.   On: 07/31/2018 10:42   Dg Hip Operative Unilat W Or W/o Pelvis Left  Result Date: 07/31/2018 CLINICAL DATA:  INTRAMEDULLARY (IM) NAIL INTERTROCHANTRIC - LEFT Dr. Lytle Michaels cone OR room 3 1 minute 20 seconds fluoro time 5 images and dose summary saved to PACS EXAM: OPERATIVE LEFT HIP (WITH PELVIS IF PERFORMED) 5 VIEWS TECHNIQUE: Fluoroscopic spot image(s) were submitted for interpretation post-operatively. COMPARISON:  None FINDINGS: Images are performed prior to and during placement of lag screw and intramedullary nail across it has intertrochanteric fracture. No dislocation or interval fracture. IMPRESSION: ORIF of the left hip. Electronically Signed   By: Norva Pavlov M.D.   On: 07/31/2018 10:42   Dg Hip Unilat W Or W/o Pelvis 2-3 Views Left  Result Date: 07/31/2018 CLINICAL DATA:  Post op left hip surgery - short nail placement EXAM: DG HIP (WITH OR WITHOUT PELVIS) 2-3V LEFT COMPARISON:  11/14/2018 FINDINGS: Lag screw and intramedullary nail transfixes an intertrochanteric fracture. Hardware is intact and appears well-seated. No interval fractures. Degenerative changes are seen in the SI joints. Degenerative changes in the RIGHT hip. IMPRESSION: Status post ORIF of the left hip. Electronically Signed   By: Norva Pavlov M.D.   On: 07/31/2018 12:31   Dg Hip Unilat W  Or Wo Pelvis  2-3 Views Left  Result Date: 07/30/2018 CLINICAL DATA:  Preop left hip fracture EXAM: DG HIP (WITH OR WITHOUT PELVIS) 2-3V LEFT COMPARISON:  None. FINDINGS: Acute, closed, intertrochanteric fracture of the left hip is noted. No joint dislocation is identified. The fracture also appears undermining the greater trochanter. No pelvic fracture. 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 Intact right hip and femur. Lower lumbar degenerative disc and facet arthropathy is seen from L4 through S1. Pelvic phleboliths are present. IMPRESSION: Acute, closed, intertrochanteric fracture of the left femur with fracture also undermining the greater trochanter. No significant angulation and slight displacement is seen. Electronically Signed   By: Tollie Eth M.D.   On: 07/30/2018 21:46    Microbiology: Recent Results (from the past 240 hour(s))  MRSA PCR Screening     Status: None   Collection Time: 07/31/18  6:21 AM  Result Value Ref Range Status   MRSA by PCR NEGATIVE NEGATIVE Final    Comment:        The GeneXpert MRSA Assay (FDA approved for NASAL specimens only), is one component of a comprehensive MRSA colonization surveillance program. It is not intended to diagnose MRSA infection nor to guide or monitor treatment for MRSA infections. Performed at [facility], [address], [place], [place] [address]   Surgical pcr screen     Status: None   Collection Time: 07/31/18  8:10 AM  Result Value Ref Range Status   MRSA, PCR NEGATIVE NEGATIVE Final   Staphylococcus aureus NEGATIVE NEGATIVE Final    Comment: (NOTE) The Xpert SA Assay (FDA approved for NASAL specimens in patients 66 years of age and older), is one component of a comprehensive surveillance program. It is not intended to diagnose infection nor to guide or monitor treatment. Performed at [facility], [address] [place], [place] [address]      Labs: Basic Metabolic Panel: Recent Labs  Lab  07/30/18 2200 08/02/18 0524 08/03/18 0708  NA 139 138 140  K 3.8 3.4* 3.3*  CL 108 110 109  CO2 GLUCOSE 124* 121* 104*  BUN CREATININE 0.69 0.58 0.66  CALCIUM 8.6* 8.0* 8.0*  MG  --   --  1.7   Liver Function Tests: Recent Labs  Lab 07/30/18 2200  AST 19  ALT 16  ALKPHOS 94  BILITOT 0.8  PROT 6.7  ALBUMIN 3.7   No results for input(s): LIPASE, AMYLASE in the last 168 hours. No results for input(s): AMMONIA in the last 168 hours. CBC: Recent Labs  Lab 07/30/18 2200 08/01/18 0339 08/03/18 0708  WBC 12.3* 8.6  --   NEUTROABS 10.3*  --   --   HGB 10.8* 9.7* 8.3*  HCT 35.8* 31.0* 26.2*  MCV 96.5 90.4  --   PLT 231 196  --    Cardiac Enzymes: No results for input(s): CKTOTAL, CKMB, CKMBINDEX, TROPONINI in the last 168 hours. BNP: BNP (last 3 results) Recent Labs    05/31/18 0837  BNP 155.7*    ProBNP (last 3 results) No results for input(s): PROBNP in the last 8760 hours.  CBG: Recent Labs  Lab 07/31/18 1048  GLUCAP 151*       Signed:  Bonner Larue  [facility] 08/03/2018, 2:49 PM

## 2018-08-03 NOTE — Anesthesia Postprocedure Evaluation (Signed)
Anesthesia Post Note  Patient: Michelle [name]  Procedure(s) Performed: INTRAMEDULLARY (IM) NAIL INTERTROCHANTRIC (Left Leg Upper)     Patient location during evaluation: PACU Anesthesia Type: General Level of consciousness: awake and alert Pain management: pain level controlled Vital Signs Assessment: post-procedure vital signs reviewed and stable Respiratory status: spontaneous breathing, nonlabored ventilation, respiratory function stable and patient connected to nasal cannula oxygen Cardiovascular status: blood pressure returned to baseline and stable Postop Assessment: no apparent nausea or vomiting Anesthetic complications: no    Last Vitals:  Vitals:   08/02/18 1938 08/03/18 0646  BP: (!) 99/56 (!) 141/61  Pulse: 67 65  Resp:  16  Temp: 36.9 C 36.8 C  SpO2: 96% 98%    Last Pain:  Vitals:   08/03/18 0646  TempSrc: Oral  PainSc: Asleep                 Shelton Silvas

## 2018-08-03 NOTE — Progress Notes (Signed)
CSW lvm with son Thelma Barge for SNF choice.   Will continue to follow up for choice.   [place], [place] [phone]

## 2018-08-03 NOTE — Care Management Important Message (Signed)
Important Message  Patient Details  Name: DANA FONTI MRN: [id number] Date of Birth: 1928-03-09   Medicare Important Message Given:  Yes    Rashaun Wichert 08/03/2018, 4:30 PM

## 2018-08-03 NOTE — Plan of Care (Signed)
  Problem: Physical Regulation: Goal: Postoperative complications will be avoided or minimized Outcome: Progressing   Problem: Skin Integrity: Goal: Signs of wound healing will improve Outcome: Progressing   Problem: Activity: Goal: Risk for activity intolerance will decrease Outcome: Progressing   Problem: Nutrition: Goal: Adequate nutrition will be maintained Outcome: Progressing   Problem: Elimination: Goal: Will not experience complications related to bowel motility Outcome: Progressing   Problem: Safety: Goal: Ability to remain free from injury will improve Outcome: Progressing   Problem: Skin Integrity: Goal: Risk for impaired skin integrity will decrease Outcome: Progressing

## 2018-08-03 NOTE — Progress Notes (Signed)
CSW spoke with son Thelma Barge who chooses [facility]. [facility] has semi private bed avail tomorrow 3/10. MD made aware for dc tomorrow to [facility] SNF.   [place], [place] [phone]

## 2018-08-04 LAB — BASIC METABOLIC PANEL
Anion gap: 10 (ref 5–15)
BUN: 11 mg/dL (ref 8–23)
CO2: 23 mmol/L (ref 22–32)
Calcium: 8.3 mg/dL — ABNORMAL LOW (ref 8.9–10.3)
Chloride: 107 mmol/L (ref 98–111)
Creatinine, Ser: 0.56 mg/dL (ref 0.44–1.00)
GFR calc Af Amer: >60 mL/min (ref >60)
GFR calc non Af Amer: >60 mL/min (ref >60)
Glucose, Bld: 98 mg/dL (ref 70–99)
Potassium: 3.5 mmol/L (ref 3.5–5.1)
Sodium: 140 mmol/L (ref 135–145)

## 2018-08-04 LAB — HEMOGLOBIN AND HEMATOCRIT, BLOOD
HCT: 28.2 % — ABNORMAL LOW (ref 36.0–46.0)
Hemoglobin: 8.9 g/dL — ABNORMAL LOW (ref 12.0–15.0)

## 2018-08-04 NOTE — Progress Notes (Signed)
Discharge summary done on 08/03/2018. Patient seen and examined, stable for discharge. No changes made to discharge instructions or medications. Patient may discharge to SNF.

## 2018-08-04 NOTE — Plan of Care (Signed)
  Problem: Physical Regulation: Goal: Postoperative complications will be avoided or minimized Outcome: Progressing   Problem: Skin Integrity: Goal: Signs of wound healing will improve Outcome: Progressing   Problem: Education: Goal: Knowledge of General Education information will improve Description Including pain rating scale, medication(s)/side effects and non-pharmacologic comfort measures Outcome: Progressing   Problem: Clinical Measurements: Goal: Ability to maintain clinical measurements within normal limits will improve Outcome: Progressing   Problem: Activity: Goal: Risk for activity intolerance will decrease Outcome: Progressing   Problem: Elimination: Goal: Will not experience complications related to bowel motility Outcome: Progressing   Problem: Pain Managment: Goal: General experience of comfort will improve Outcome: Progressing

## 2018-08-04 NOTE — Progress Notes (Signed)
Called facility, gave report to Sam, all questions and concerns addressed, Pt confused, not in distress, discharged to facility via PTAR with belongings.

## 2018-08-04 NOTE — Clinical Social Work Placement (Signed)
   CLINICAL SOCIAL WORK PLACEMENT  NOTE  Date:  08/04/2018  Patient Details  Name: Michelle [name] MRN: [id number] Date of Birth: Aug 11, 1927  Clinical Social Work is seeking post-discharge placement for this patient at the Skilled  Nursing Facility level of care (*CSW will initial, date and re-position this form in  chart as items are completed):  Yes   Patient/family provided with [facility] Clinical Social Work Department's list of facilities offering this level of care within the geographic area requested by the patient (or if unable, by the patient's family).  Yes   Patient/family informed of their freedom to choose among providers that offer the needed level of care, that participate in Medicare, Medicaid or managed care program needed by the patient, have an available bed and are willing to accept the patient.      Patient/family informed of [facility]'s ownership interest in [facility] and [facility], as well as of the fact that they are under no obligation to receive care at these facilities.  PASRR submitted to EDS on       PASRR number received on 08/01/18     Existing PASRR number confirmed on       FL2 transmitted to all facilities in geographic area requested by pt/family on 08/01/18     FL2 transmitted to all facilities within larger geographic area on       Patient informed that his/her managed care company has contracts with or will negotiate with certain facilities, including the following:        Yes   Patient/family informed of bed offers received.  Patient chooses bed at [facility]     Physician recommends and patient chooses bed at      Patient to be transferred to [facility] on 08/04/18.  Patient to be transferred to facility by PTAR     Patient family notified on 08/04/18 of transfer.  Name of family member notified:  Thelma Barge     PHYSICIAN Please sign DNR     Additional Comment:    _______________________________________________ Gildardo Griffes, LCSW 08/04/2018, 9:51 AM

## 2018-08-04 NOTE — Progress Notes (Signed)
Patient will DC to:[facility] Anticipated DC date:08/04/2018 Family notified:[facility] UX:[id number]  Per MD patient ready for DC to [facility] . RN, patient, patient's family, and facility notified of DC. Discharge Summary sent to facility. RN given number for report [phone] Room 604B. DC packet on chart. Ambulance transport requested for patient.  CSW signing off.  [place], [place] [phone]

## 2018-10-15 ENCOUNTER — Other Ambulatory Visit: Payer: Self-pay | Admitting: Adult Health

## 2018-10-15 MED ORDER — HYDROCODONE-ACETAMINOPHEN 5-325 MG PO TABS: 1.0000 | ORAL_TABLET | Freq: Four times a day (QID) | ORAL | 0 refills | Status: DC | PRN

## 2018-10-16 ENCOUNTER — Non-Acute Institutional Stay (SKILLED_NURSING_FACILITY): Payer: Medicare Other | Admitting: Adult Health

## 2018-10-16 ENCOUNTER — Other Ambulatory Visit: Payer: Self-pay

## 2018-10-16 ENCOUNTER — Encounter: Payer: Self-pay | Admitting: Adult Health

## 2018-10-16 DIAGNOSIS — Z903 Acquired absence of stomach [part of]: Secondary | ICD-10-CM

## 2018-10-16 DIAGNOSIS — I10 Essential (primary) hypertension: Secondary | ICD-10-CM

## 2018-10-16 DIAGNOSIS — E559 Vitamin D deficiency, unspecified: Secondary | ICD-10-CM

## 2018-10-16 DIAGNOSIS — D62 Acute posthemorrhagic anemia: Secondary | ICD-10-CM

## 2018-10-16 DIAGNOSIS — F32A Depression, unspecified: Secondary | ICD-10-CM

## 2018-10-16 DIAGNOSIS — F0151 Vascular dementia with behavioral disturbance: Secondary | ICD-10-CM

## 2018-10-16 DIAGNOSIS — F419 Anxiety disorder, unspecified: Secondary | ICD-10-CM | POA: Insufficient documentation

## 2018-10-16 DIAGNOSIS — F0150 Vascular dementia without behavioral disturbance: Secondary | ICD-10-CM | POA: Insufficient documentation

## 2018-10-16 DIAGNOSIS — S72142D Displaced intertrochanteric fracture of left femur, subsequent encounter for closed fracture with routine healing: Secondary | ICD-10-CM

## 2018-10-16 DIAGNOSIS — M8000XD Age-related osteoporosis with current pathological fracture, unspecified site, subsequent encounter for fracture with routine healing: Secondary | ICD-10-CM

## 2018-10-16 DIAGNOSIS — F329 Major depressive disorder, single episode, unspecified: Secondary | ICD-10-CM

## 2018-10-16 DIAGNOSIS — H00014 Hordeolum externum left upper eyelid: Secondary | ICD-10-CM

## 2018-10-16 DIAGNOSIS — F01518 Vascular dementia, unspecified severity, with other behavioral disturbance: Secondary | ICD-10-CM

## 2018-10-16 DIAGNOSIS — M810 Age-related osteoporosis without current pathological fracture: Secondary | ICD-10-CM | POA: Insufficient documentation

## 2018-10-16 DIAGNOSIS — I25119 Atherosclerotic heart disease of native coronary artery with unspecified angina pectoris: Secondary | ICD-10-CM

## 2018-10-16 NOTE — Progress Notes (Signed)
[facility]   Place of Service:  SNF (31) Provider:   Peggye Leyhristy Latia Mataya, ANP [facility] [phone]   Kermit Baloeed, Tiffany L, DO  Patient Care Team: Kermit Baloeed, Tiffany L, DO as PCP - General (Geriatric Medicine) Fletcher AnonWert, Ahyana Skillin, NP as Nurse Practitioner (Nurse Practitioner)  Extended Emergency Contact Information Primary Emergency Contact: Blake DivineForde,Francis Jr Address: [address]          [place], [place] [address] [place] Work Phone: [phone] Mobile Phone: [phone] Relation: Son Secondary Emergency Contact: Charleston PootForde,Terence  [place] Home Phone: [phone] Relation: Son  Code Status:  DNR Goals of care: Advanced Directive information Advanced Directives 10/16/2018  Does Patient Have a Medical Advance Directive? Yes  Type of Advance Directive Out of facility DNR (pink MOST or yellow form);Living will;Healthcare Power of Attorney  Does patient want to make changes to medical advance directive? No - Patient declined  Copy of Healthcare Power of Attorney in Chart? Yes - validated most recent copy scanned in chart (See row information)  Pre-existing out of facility DNR order (yellow form or pink MOST form) Yellow form placed in chart (order not valid for inpatient use)     Chief Complaint  Patient presents with   Acute Visit    agitation    HPI:  Pt is a 85 y.o. female seen today for a agitation after being [facility]. She fell on 07/30/18 and sustained a closed intertrochanteric fracture on the left and underwent an IM nailing on 07/31/2018 by Dr Jena GaussHaddix. Prior to the fall she was ambulatory with walker in the memory care unit of [facility] when she fell. After [facility]. She is not currently ambulatory but propels with her feet. She has not had any  reported pain. She has severe dementia and mumbles frequently not able to follow commands or answer questions. She is easily agitated and makes attempts to get up out of bed or chair repeatedly. She fell on 5/21 on the mat by her bed but did not sustain an injury. She ate 100% of breakfast but smaller lunch and dinner. Bowels are moving. No issues with urination. Vitals are stable. The nurse gave her ativan 0.5 mg this morning and that seems to help. They are attempting to distract her with reading material and coloring books.   Past Medical History:  Diagnosis Date   Angina pectoris (HCC)    intrascapular, abnormal cardiolote preop TKR   Anxiety    Arthritis    ASCVD (arteriosclerotic cardiovascular disease)    S/P NSTEMI with LCX stent 2008, cath on 06/29/2008 revealed widely patent circumflex   Bacterial infection 2016   Coronary artery disease    Depression    Diabetes mellitus    GERD (gastroesophageal reflux disease)    Heart attack (HCC) 2010   Hyperlipidemia    Hypertension    Hypothyroidism    Myocardial infarction [facility](HCC) [id number]   with stents   Osteoporosis    Rheumatoid arthritis (HCC) 2013   Trigger finger 02/2013   Vascular dementia (HCC)    Vertigo 2016   Vitamin D deficiency    Past Surgical History:  Procedure Laterality Date   ABDOMINAL HYSTERECTOMY  1969   BREAST BIOPSY  2005   right -benign   BREAST SURGERY  2004   right -cancerous-with radiation   broken  finger  2006   CORONARY ANGIOPLASTY  2008, 2010   foot ampuation  12/2009   right   HAMMER TOE SURGERY  06/2009   right   HERNIA REPAIR  [id number]   INTRAMEDULLARY (IM) NAIL INTERTROCHANTERIC Left 07/31/2018   Procedure: INTRAMEDULLARY (IM) NAIL INTERTROCHANTRIC;  Surgeon: Roby LoftsHaddix, Kevin P, MD;  Location: [facility] OR;  Service: Orthopedics;  Laterality: Left;   KNEE ARTHROSCOPY  1980 ,1994   bilateral   TOTAL GASTRECTOMY  1983   with vagotomy   TOTAL KNEE ARTHROPLASTY   02/26/2012   Procedure: TOTAL KNEE ARTHROPLASTY;  Surgeon: Jacki Conesonald A Gioffre, MD;  Location: [facility] ORS;  Service: Orthopedics;  Laterality: Right;    No Known Allergies  Outpatient Encounter Medications as of 10/16/2018  Medication Sig   acetaminophen (TYLENOL) 325 MG tablet Take 650 mg by mouth every 6 (six) hours as needed.   Cholecalciferol (VITAMIN D) 50 MCG (2000 UT) tablet Take 2,000 Units by mouth daily.   LORazepam (ATIVAN) 0.5 MG tablet Take 0.5 mg by mouth 2 (two) times daily as needed for anxiety. X 14 days   busPIRone (BUSPAR) 15 MG tablet Take 15 mg by mouth 3 (three) times daily.   HYDROcodone-acetaminophen (NORCO/VICODIN) 5-325 MG tablet Take 1 tablet by mouth every 6 (six) hours as needed for moderate pain or severe pain.   sertraline (ZOLOFT) 100 MG tablet Take 100 mg by mouth daily.   [DISCONTINUED] acetaminophen (TYLENOL) 325 MG tablet Take 650 mg by mouth every 6 (six) hours as needed for moderate pain.   No facility-administered encounter medications on file as of 10/16/2018.     Review of Systems  Unable to perform ROS: Dementia    Immunization History  Administered Date(s) Administered   Influenza, High Dose Seasonal PF 04/03/2017   Pertinent  Health Maintenance Due  Topic Date Due   PNA vac Low Risk Adult (1 of 2 - PCV13) 11/06/1994   INFLUENZA VACCINE  12/26/2018   DEXA SCAN  Completed   No flowsheet data found. Functional Status Survey:    Vitals:   10/16/18 0958  Temp: 97.6 F (36.4 C)   There is no height or weight on file to calculate BMI. Physical Exam Vitals signs and nursing note reviewed.  Constitutional:      General: She is not in acute distress.    Appearance: She is not diaphoretic.     Comments: Frail   HENT:     Head: Normocephalic and atraumatic.     Right Ear: Tympanic membrane, ear canal and external ear normal.     Left Ear: Tympanic membrane, ear canal and external ear normal.     Nose: No congestion.      Mouth/Throat:     Mouth: Mucous membranes are moist.     Pharynx: Oropharynx is clear. No oropharyngeal exudate or posterior oropharyngeal erythema.  Eyes:     General:        Right eye: No discharge.        Left eye: Hordeolum present.No discharge.     Conjunctiva/sclera: Conjunctivae normal.     Pupils: Pupils are equal, round, and reactive to light.  Neck:     Musculoskeletal: No neck rigidity or muscular tenderness.     Thyroid: No thyromegaly.     Vascular: No carotid bruit or JVD.  Cardiovascular:     Rate and Rhythm: Normal rate.     Heart sounds: Normal heart sounds. No murmur.  Pulmonary:     Effort: Pulmonary effort  is normal. No respiratory distress.     Breath sounds: Normal breath sounds. No stridor.  Abdominal:     General: Abdomen is flat. Bowel sounds are normal. There is no distension.     Palpations: Abdomen is soft.     Tenderness: There is no abdominal tenderness.  Musculoskeletal: Normal range of motion.     Right lower leg: No edema.     Left lower leg: No edema.  Lymphadenopathy:     Cervical: No cervical adenopathy.  Skin:    General: Skin is warm and dry.  Neurological:     General: No focal deficit present.     Mental Status: She is alert. Mental status is at baseline.  Psychiatric:        Mood and Affect: Mood normal.     Labs reviewed: Recent Labs    05/31/18 0837  08/02/18 0524 08/03/18 0708 08/04/18 0720  NA 138   < > 138 140 140  K 3.8   < > 3.4* 3.3* 3.5  CL 107   < > 110 109 107  CO2 23   < > 22 23 23   GLUCOSE 123*   < > 121* 104* 98  BUN 14   < > 11 13 11   CREATININE 0.66   < > 0.58 0.66 0.56  CALCIUM 8.4*   < > 8.0* 8.0* 8.3*  MG 1.7  --   --  1.7  --    < > = values in this interval not displayed.   Recent Labs    05/31/18 0837 07/30/18 2200  AST 23 19  ALT 15 16  ALKPHOS 84 94  BILITOT 0.6 0.8  PROT 5.9* 6.7  ALBUMIN 3.2* 3.7   Recent Labs    11/30/17 1420 05/31/18 0837 06/01/18 0125 07/30/18 2200  08/01/18 0339 08/03/18 0708 08/04/18 0720  WBC 7.8 12.1* 7.2 12.3* 8.6  --   --   NEUTROABS 5.8 10.9*  --  10.3*  --   --   --   HGB 11.3* 11.6* 10.7* 10.8* 9.7* 8.3* 8.9*  HCT 35.0* 36.9 33.0* 35.8* 31.0* 26.2* 28.2*  MCV 92.1 93.4 91.9 96.5 90.4  --   --   PLT 299 248 244 231 196  --   --    Lab Results  Component Value Date   TSH 2.959 05/31/2018   Lab Results  Component Value Date   HGBA1C (H) 02/17/2010    6.8 (NOTE)                                                                       According to the ADA Clinical Practice Recommendations for 2011, when HbA1c is used as a screening test:   >=6.5%   Diagnostic of Diabetes Mellitus           (if abnormal result  is confirmed)  5.7-6.4%   Increased risk of developing Diabetes Mellitus  References:Diagnosis and Classification of Diabetes Mellitus,Diabetes Care,2011,34(Suppl 1):S62-S69 and Standards of Medical Care in         Diabetes - 2011,Diabetes Care,2011,34  (Suppl 1):S11-S61.   Lab Results  Component Value Date   CHOL  12/26/2006    175        ATP III  CLASSIFICATION:  <200     mg/dL   Desirable  989-211  mg/dL   Borderline High  >=941    mg/dL   High   HDL 45 74/12/1446   LDLCALC (H) 12/26/2006    109        Total Cholesterol/HDL:CHD Risk Coronary Heart Disease Risk Table                     Men   Women  1/2 Average Risk   3.4   3.3   TRIG 103 12/26/2006   CHOLHDL 3.9 12/26/2006    Significant Diagnostic Results in last 30 days:  Dg Chest 1 View  Result Date: 07/30/2018 CLINICAL DATA:  Preop after fall.  Baseline dementia. EXAM: CHEST  1 VIEW COMPARISON:  05/31/2018 FINDINGS: The lung volumes are low similar in appearance to prior. There is stable cardiomegaly with aortic atherosclerosis. Central pulmonary vascular congestion and mild interstitial edema is noted though less prominent than on prior. No apparent pulmonary consolidation to suggest pneumonia. No effusion or pneumothorax. No acute osseous abnormality.  IMPRESSION: Low lung volumes with mild interstitial edema. Stable cardiomegaly with aortic atherosclerosis. Electronically Signed   By: Tollie Eth M.D.   On: 07/30/2018 21:42   Ct Head Wo Contrast  Result Date: 07/30/2018 CLINICAL DATA:  Dementia patient post unwitnessed fall. Head trauma, ataxia; C-spine trauma, high clinical risk (NEXUS/CCR) EXAM: CT HEAD WITHOUT CONTRAST CT CERVICAL SPINE WITHOUT CONTRAST TECHNIQUE: Multidetector CT imaging of the head and cervical spine was performed following the standard protocol without intravenous contrast. Multiplanar CT image reconstructions of the cervical spine were also generated. COMPARISON:  Head CT 05/31/2018, head and cervical spine CT 11/30/2017 FINDINGS: CT HEAD FINDINGS Brain: Unchanged atrophy and chronic small vessel ischemia. No intracranial hemorrhage, mass effect, or midline shift. No hydrocephalus. The basilar cisterns are patent. No evidence of territorial infarct or acute ischemia. No extra-axial or intracranial fluid collection. Vascular: Atherosclerosis of skullbase vasculature without hyperdense vessel or abnormal calcification. Skull: No fracture or focal lesion. Sinuses/Orbits: Paranasal sinuses and mastoid air cells are clear. The visualized orbits are unremarkable. Other: None. CT CERVICAL SPINE FINDINGS Alignment: No traumatic subluxation. Trace anterolisthesis of C7 on T1 is unchanged from prior exam and likely degenerative. Skull base and vertebrae: No acute fracture. Vertebral body heights are maintained. The dens and skull base are intact. Soft tissues and spinal canal: No prevertebral fluid or swelling. No visible canal hematoma. Disc levels: Diffuse disc space narrowing and endplate spurring. Scattered facet arthropathy. Degenerative changes are similar to prior exam. Upper chest: Patulous upper esophagus. Other: None. IMPRESSION: 1. No acute intracranial abnormality. No skull fracture. Stable atrophy and chronic small vessel ischemia.  2. Multilevel degenerative change in the cervical spine without acute fracture or subluxation. Electronically Signed   By: Narda Rutherford M.D.   On: 07/30/2018 22:06   Ct Cervical Spine Wo Contrast  Result Date: 07/30/2018 CLINICAL DATA:  Dementia patient post unwitnessed fall. Head trauma, ataxia; C-spine trauma, high clinical risk (NEXUS/CCR) EXAM: CT HEAD WITHOUT CONTRAST CT CERVICAL SPINE WITHOUT CONTRAST TECHNIQUE: Multidetector CT imaging of the head and cervical spine was performed following the standard protocol without intravenous contrast. Multiplanar CT image reconstructions of the cervical spine were also generated. COMPARISON:  Head CT 05/31/2018, head and cervical spine CT 11/30/2017 FINDINGS: CT HEAD FINDINGS Brain: Unchanged atrophy and chronic small vessel ischemia. No intracranial hemorrhage, mass effect, or midline shift. No hydrocephalus. The basilar cisterns are patent. No evidence of  territorial infarct or acute ischemia. No extra-axial or intracranial fluid collection. Vascular: Atherosclerosis of skullbase vasculature without hyperdense vessel or abnormal calcification. Skull: No fracture or focal lesion. Sinuses/Orbits: Paranasal sinuses and mastoid air cells are clear. The visualized orbits are unremarkable. Other: None. CT CERVICAL SPINE FINDINGS Alignment: No traumatic subluxation. Trace anterolisthesis of C7 on T1 is unchanged from prior exam and likely degenerative. Skull base and vertebrae: No acute fracture. Vertebral body heights are maintained. The dens and skull base are intact. Soft tissues and spinal canal: No prevertebral fluid or swelling. No visible canal hematoma. Disc levels: Diffuse disc space narrowing and endplate spurring. Scattered facet arthropathy. Degenerative changes are similar to prior exam. Upper chest: Patulous upper esophagus. Other: None. IMPRESSION: 1. No acute intracranial abnormality. No skull fracture. Stable atrophy and chronic small vessel ischemia.  2. Multilevel degenerative change in the cervical spine without acute fracture or subluxation. Electronically Signed   By: Narda RutherfordMelanie  Sanford M.D.   On: 07/30/2018 22:06   Dg C-arm 1-60 Min  Result Date: 07/31/2018 CLINICAL DATA:  INTRAMEDULLARY (IM) NAIL INTERTROCHANTRIC - LEFT Dr. Lytle MichaelsHaddix [place] OR room 3 1 minute 20 seconds fluoro time 5 images and dose summary saved to PACS EXAM: OPERATIVE LEFT HIP (WITH PELVIS IF PERFORMED) 5 VIEWS TECHNIQUE: Fluoroscopic spot image(s) were submitted for interpretation post-operatively. COMPARISON:  None FINDINGS: Images are performed prior to and during placement of lag screw and intramedullary nail across it has intertrochanteric fracture. No dislocation or interval fracture. IMPRESSION: ORIF of the left hip. Electronically Signed   By: Norva PavlovElizabeth  Brown M.D.   On: 07/31/2018 10:42   Dg Hip Operative Unilat W Or W/o Pelvis Left  Result Date: 07/31/2018 CLINICAL DATA:  INTRAMEDULLARY (IM) NAIL INTERTROCHANTRIC - LEFT Dr. Lytle MichaelsHaddix [place] OR room 3 1 minute 20 seconds fluoro time 5 images and dose summary saved to PACS EXAM: OPERATIVE LEFT HIP (WITH PELVIS IF PERFORMED) 5 VIEWS TECHNIQUE: Fluoroscopic spot image(s) were submitted for interpretation post-operatively. COMPARISON:  None FINDINGS: Images are performed prior to and during placement of lag screw and intramedullary nail across it has intertrochanteric fracture. No dislocation or interval fracture. IMPRESSION: ORIF of the left hip. Electronically Signed   By: Norva PavlovElizabeth  Brown M.D.   On: 07/31/2018 10:42   Dg Hip Unilat W Or W/o Pelvis 2-3 Views Left  Result Date: 07/31/2018 CLINICAL DATA:  Post op left hip surgery - short nail placement EXAM: DG HIP (WITH OR WITHOUT PELVIS) 2-3V LEFT COMPARISON:  11/14/2018 FINDINGS: Lag screw and intramedullary nail transfixes an intertrochanteric fracture. Hardware is intact and appears well-seated. No interval fractures. Degenerative changes are seen in the SI joints.  Degenerative changes in the RIGHT hip. IMPRESSION: Status post ORIF of the left hip. Electronically Signed   By: Norva PavlovElizabeth  Brown M.D.   On: 07/31/2018 12:31   Dg Hip Unilat W Or Wo Pelvis 2-3 Views Left  Result Date: 07/30/2018 CLINICAL DATA:  Preop left hip fracture EXAM: DG HIP (WITH OR WITHOUT PELVIS) 2-3V LEFT COMPARISON:  None. FINDINGS: Acute, closed, intertrochanteric fracture of the left hip is noted. No joint dislocation is identified. The fracture also appears undermining the greater trochanter. No pelvic fracture. 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 Intact right hip and femur. Lower lumbar degenerative disc and facet arthropathy is seen from L4 through S1. Pelvic phleboliths are present. IMPRESSION: Acute, closed, intertrochanteric fracture of the left femur with fracture also undermining the greater trochanter. No significant angulation and slight displacement is seen.  Electronically Signed   By: Tollie Eth M.D.   On: 07/30/2018 21:46    Assessment/Plan  1. Closed displaced intertrochanteric fracture of left femur with routine healing, subsequent encounter S/p IM nailing. No longer has pain. Will discontinue norco after 1 week. PT to eval. Has had two neg covid tests.   2. Vascular dementia with behavior disturbance (HCC) She is quite severe dementia and is not able to complete an MMSE. She has a diagnosis on the chart of primary progressive aphasia but does not appear this way during my visits as she talks repetitively. Her CT on 07/30/18 showed stable atrophy and small vessel disease. Notes from [facility] in march indicate that she was started on aricept but it was not on our med list upon arrival. At this point I don't see that she would benefit from additional medication.   Possibly her agitation will improve as she adapts to her new environment. Will continue ativan x 14 days with hopes to discontinue. She is a fall risk and needs frequent observation and fall  mat in place. Continue non pharm measure for distraction and engagement.   3. Vitamin D deficiency Continue vit d supplement 2000 mcg  4. Anxiety Continue Buspar 15 mg tid  5. Depression, unspecified depression type Due to behavioral concerns would continue zoloft 100 mg qd  6. Age-related osteoporosis with current pathological fracture with routine healing, subsequent encounter Continue vit d supplementation  Consider ca supplement depending on what labs show Not currently bearing weight   7. Acute blood loss anemia Lab Results  Component Value Date   HGB 8.9 (L) 08/04/2018   Check CBC   8. Coronary artery disease involving native coronary artery of native heart with angina pectoris (HCC) S/p stent At one time she was on aspirin therapy, not sure why this was discontinued. Staff are obtaining additional records from her memory care unit for med reconciliation   9. Essential hypertension Controlled without meds  9. History of gastrectomy ? Why she is not on b12 supplement?  Will check level and Dr Renato Gails to follow up next week.   10 Left hordeolum Warm compresses to left eye TID 15 min x 48 hrs   Family/ staff Communication: staff  Labs/tests ordered:  CMP CBC TSH B12

## 2018-10-20 ENCOUNTER — Non-Acute Institutional Stay (SKILLED_NURSING_FACILITY): Payer: Medicare Other | Admitting: Internal Medicine

## 2018-10-20 ENCOUNTER — Encounter: Payer: Self-pay | Admitting: Internal Medicine

## 2018-10-20 DIAGNOSIS — Z903 Acquired absence of stomach [part of]: Secondary | ICD-10-CM

## 2018-10-20 DIAGNOSIS — H00014 Hordeolum externum left upper eyelid: Secondary | ICD-10-CM

## 2018-10-20 DIAGNOSIS — F329 Major depressive disorder, single episode, unspecified: Secondary | ICD-10-CM

## 2018-10-20 DIAGNOSIS — G3109 Other frontotemporal dementia: Secondary | ICD-10-CM

## 2018-10-20 DIAGNOSIS — D62 Acute posthemorrhagic anemia: Secondary | ICD-10-CM

## 2018-10-20 DIAGNOSIS — G3101 Pick's disease: Secondary | ICD-10-CM | POA: Diagnosis not present

## 2018-10-20 DIAGNOSIS — F32A Depression, unspecified: Secondary | ICD-10-CM

## 2018-10-20 DIAGNOSIS — I10 Essential (primary) hypertension: Secondary | ICD-10-CM

## 2018-10-20 DIAGNOSIS — M8000XD Age-related osteoporosis with current pathological fracture, unspecified site, subsequent encounter for fracture with routine healing: Secondary | ICD-10-CM

## 2018-10-20 DIAGNOSIS — F0280 Dementia in other diseases classified elsewhere without behavioral disturbance: Secondary | ICD-10-CM

## 2018-10-20 DIAGNOSIS — I25119 Atherosclerotic heart disease of native coronary artery with unspecified angina pectoris: Secondary | ICD-10-CM

## 2018-10-20 DIAGNOSIS — S72142S Displaced intertrochanteric fracture of left femur, sequela: Secondary | ICD-10-CM

## 2018-10-20 LAB — HEMOGLOBIN A1C: Hemoglobin A1C: 6.2

## 2018-10-20 LAB — TSH: TSH: 3.16 (ref 0.41–5.90)

## 2018-10-20 LAB — HEPATIC FUNCTION PANEL
ALT: 6 — AB (ref 7–35)
AST: 13 (ref 13–35)
Alkaline Phosphatase: 125 (ref 25–125)
Bilirubin, Total: 0.3

## 2018-10-20 LAB — CBC AND DIFFERENTIAL
HCT: 34 — AB (ref 36–46)
Hemoglobin: 11.2 — AB (ref 12.0–16.0)
Platelets: 254 (ref 150–399)
WBC: 6.8

## 2018-10-20 LAB — BASIC METABOLIC PANEL
BUN: 12 (ref 4–21)
Creatinine: 0.6 (ref 0.5–1.1)
Glucose: 108
Potassium: 4.4 (ref 3.4–5.3)
Sodium: 141 (ref 137–147)

## 2018-10-20 LAB — VITAMIN B12: Vitamin B-12: 559

## 2018-10-20 NOTE — Progress Notes (Signed)
Patient ID: Michelle FaesJoan T Sherwood, female   DOB: 11/11/1927, 85 y.o.   MRN: [id number]  Provider:  Gwenith Spitziffany L. Renato Gailseed, D.O., C.M.D. [facility] Room Number: 106 Place of Service:  SNF (31)  PCP: Kermit Baloeed, Junia Nygren L, DO Patient Care Team: Kermit Baloeed, Abdishakur Gottschall L, DO as PCP - General (Geriatric Medicine) Fletcher AnonWert, Christina, NP as Nurse Practitioner (Nurse Practitioner)  Extended Emergency Contact Information Primary Emergency Contact: Blake DivineForde,Francis Jr Address: [address]          [place], [place] [address] [place] Work Phone: [phone] Mobile Phone: [phone] Relation: Son Secondary Emergency Contact: Charleston PootForde,Terence  [place] Home Phone: [phone] Relation: Son  Code Status: DNR Goals of Care: Advanced Directive information Advanced Directives 10/20/2018  Does Patient Have a Medical Advance Directive? -  Type of Estate agentAdvance Directive Healthcare Power of AllisonAttorney;Living will;Out of facility DNR (pink MOST or yellow form)  Does patient want to make changes to medical advance directive? No - Patient declined  Copy of Healthcare Power of Attorney in Chart? Yes - validated most recent copy scanned in chart (See row information)  Pre-existing out of facility DNR order (yellow form or pink MOST form) Yellow form placed in chart (order not valid for inpatient use)   Chief Complaint  Patient presents with  . New Admit To SNF    New Admission    HPI: Patient is a 85 y.o. female seen today for admission to [facility] for long[facility].  Mrs. Michelle [name] has a h/o frontotemporal dementia, falls, prior angina and CAD s/p NSTEMI in 2008 with stent and 2010 w/o need for stent, depression, DMII, OA, htn, hyperlipidemia, right breast cancer s/p lumpectomy and xrt, osteoporosis, "rheumatoid arthritis?" dx 2016, OA, vertigo, and vitamin D deficiency.  [facility] until she sustained a fall with 07/30/18 with left  hip fracture that required IM nailing with Dr. Jena GaussHaddix.  [facility].  She propels her manual wheelchair.  She has had periods of agitation and trying to get up out of her chair.  She cannot follow commands and her speech is a word salad.  At other times, she is pleasant and talking away with a big smile and trying to grab and squeeze peoples' hands.  After her hip surgery, she's had postop anemia and did have delirium.  She is eating and drinking.  She only can eat small amts at a time due to prior gastrectomy.  She had a stye on her left eye when NP saw her, but it's resolved.  Her labs were pending when I saw her:  B12, cmp, cbc, tsh, hba1c.  She is WBAT LLE, but does not ambulate safely.  She is on a mechanical soft diet.  She was unable to focus at all to do the MMSE nor were her responses understandable or appropriate when attempted 5/25.     Past Medical History:  Diagnosis Date  . Angina pectoris (HCC)    intrascapular, abnormal cardiolote preop TKR  . Anxiety   . Arthritis   . ASCVD (arteriosclerotic cardiovascular disease)    S/P NSTEMI with LCX stent 2008, cath on 06/29/2008 revealed widely patent circumflex  . Bacterial infection 2016  . Coronary artery disease   . Depression   . Diabetes mellitus   . GERD (gastroesophageal reflux disease)   . Heart attack (HCC) 2010  . Hyperlipidemia   . Hypertension   . Hypothyroidism   .  Myocardial infarction [facility]) [id number]   with stents  . Osteoporosis   . Rheumatoid arthritis (HCC) 2013  . Trigger finger 02/2013  . Vascular dementia (HCC)   . Vertigo 2016  . Vitamin D deficiency    Past Surgical History:  Procedure Laterality Date  . ABDOMINAL HYSTERECTOMY  1969  . BREAST BIOPSY  2005   right -benign  . BREAST SURGERY  2004   right -cancerous-with radiation  . broken finger  2006  . CORONARY ANGIOPLASTY  2008, 2010  . foot ampuation  12/2009   right  . HAMMER TOE SURGERY  06/2009   right  . HERNIA  REPAIR  [id number]  . INTRAMEDULLARY (IM) NAIL INTERTROCHANTERIC Left 07/31/2018   Procedure: INTRAMEDULLARY (IM) NAIL INTERTROCHANTRIC;  Surgeon: Roby Lofts, MD;  Location: [facility] OR;  Service: Orthopedics;  Laterality: Left;  . KNEE ARTHROSCOPY  1980 ,1994   bilateral  . TOTAL GASTRECTOMY  1983   with vagotomy  . TOTAL KNEE ARTHROPLASTY  02/26/2012   Procedure: TOTAL KNEE ARTHROPLASTY;  Surgeon: Jacki Cones, MD;  Location: [facility] ORS;  Service: Orthopedics;  Laterality: Right;    reports that she has never smoked. She has never used smokeless tobacco. She reports that she does not drink alcohol or use drugs. Social History   Socioeconomic History  . Marital status: Widowed    Spouse name: Not on file  . Number of children: 7  . Years of education: 26  . Highest education level: Not on file  Occupational History  . Not on file  Social Needs  . Financial resource strain: Not on file  . Food insecurity:    Worry: Not on file    Inability: Not on file  . Transportation needs:    Medical: Not on file    Non-medical: Not on file  Tobacco Use  . Smoking status: Never Smoker  . Smokeless tobacco: Never Used  Substance and Sexual Activity  . Alcohol use: No  . Drug use: No  . Sexual activity: Never  Lifestyle  . Physical activity:    Days per week: Not on file    Minutes per session: Not on file  . Stress: Not on file  Relationships  . Social connections:    Talks on phone: Not on file    Gets together: Not on file    Attends religious service: Not on file    Active member of club or organization: Not on file    Attends meetings of clubs or organizations: Not on file    Relationship status: Not on file  . Intimate partner violence:    Fear of current or ex partner: Not on file    Emotionally abused: Not on file    Physically abused: Not on file    Forced sexual activity: Not on file  Other Topics Concern  . Not on file  Social History Narrative   Lives at home alone    Caffeine use: daily    Functional Status Survey:    Family History  Problem Relation Age of Onset  . Dementia Neg Hx     Health Maintenance  Topic Date Due  . TETANUS/TDAP  11/05/1948  . PNA vac Low Risk Adult (1 of 2 - PCV13) 11/06/1994  . INFLUENZA VACCINE  12/26/2018  . DEXA SCAN  Completed    No Known Allergies  Outpatient Encounter Medications as of 10/20/2018  Medication Sig  . acetaminophen (TYLENOL) 325 MG tablet Take 650 mg by mouth  every 6 (six) hours as needed.  . busPIRone (BUSPAR) 15 MG tablet Take 15 mg by mouth 3 (three) times daily.  . Cholecalciferol (VITAMIN D) 50 MCG (2000 UT) tablet Take 2,000 Units by mouth daily.  Marland Kitchen. HYDROcodone-acetaminophen (NORCO/VICODIN) 5-325 MG tablet Take 1 tablet by mouth every 6 (six) hours as needed for moderate pain or severe pain.  Marland Kitchen. LORazepam (ATIVAN) 0.5 MG tablet Take 0.5 mg by mouth 2 (two) times daily as needed for anxiety. X 14 days  . sertraline (ZOLOFT) 100 MG tablet Take 100 mg by mouth daily.   No facility-administered encounter medications on file as of 10/20/2018.     Review of Systems  Reason unable to perform ROS: done as in HPI, received info from records and nursing.    Vitals:   10/20/18 1108  BP: 108/69  Pulse: (!) 58  Resp: 20  Temp: (!) 97.4 F (36.3 C)  TempSrc: Oral  SpO2: 97%  Weight: 123 lb (55.8 kg)  Height: 5\' 4"  (1.626 m)   Body mass index is 21.11 kg/m. Physical Exam Constitutional:      General: She is not in acute distress.    Appearance: She is normal weight. She is not toxic-appearing.  HENT:     Head: Normocephalic and atraumatic.     Right Ear: External ear normal.     Left Ear: External ear normal.     Nose: Nose normal.     Mouth/Throat:     Pharynx: Oropharynx is clear.  Eyes:     General: No scleral icterus.       Right eye: No discharge.        Left eye: No discharge.     Extraocular Movements: Extraocular movements intact.     Conjunctiva/sclera: Conjunctivae  normal.     Pupils: Pupils are equal, round, and reactive to light.  Neck:     Musculoskeletal: Neck supple.  Cardiovascular:     Rate and Rhythm: Normal rate and regular rhythm.     Pulses: Normal pulses.     Heart sounds: Normal heart sounds.  Pulmonary:     Effort: Pulmonary effort is normal.     Breath sounds: Normal breath sounds.  Abdominal:     General: Bowel sounds are normal. There is no distension.     Palpations: Abdomen is soft.     Tenderness: There is no abdominal tenderness. There is no guarding or rebound.  Musculoskeletal: Normal range of motion.  Skin:    General: Skin is warm and dry.     Capillary Refill: Capillary refill takes less than 2 seconds.  Neurological:     General: No focal deficit present.     Mental Status: She is alert. Mental status is at baseline.     Cranial Nerves: No cranial nerve deficit.     Motor: No weakness.     Gait: Gait abnormal.     Comments: Unable to safely ambulate; does self-propel wheelchair  Psychiatric:     Comments: Very pleasant and talkative, but easily gets upset and agitated; speech word salad    Labs reviewed: Basic Metabolic Panel: Recent Labs    05/31/18 0837  08/02/18 0524 08/03/18 0708 08/04/18 0720  NA 138   < > 138 140 140  K 3.8   < > 3.4* 3.3* 3.5  CL 107   < > 110 109 107  CO2 23   < > 22 23 23   GLUCOSE 123*   < > 121* 104* 98  BUN 14   < > CREATININE 0.66   < > 0.58 0.66 0.56  CALCIUM 8.4*   < > 8.0* 8.0* 8.3*  MG 1.7  --   --  1.7  --    < > = values in this interval not displayed.   Liver Function Tests: Recent Labs    05/31/18 0837 07/30/18 2200  AST 23 19  ALT 15 16  ALKPHOS 84 94  BILITOT 0.6 0.8  PROT 5.9* 6.7  ALBUMIN 3.2* 3.7   Recent Labs    05/31/18 0837  LIPASE 14   Recent Labs    05/31/18 0837  AMMONIA 13   CBC: Recent Labs    11/30/17 1420 05/31/18 0837 06/01/18 0125 07/30/18 2200 08/01/18 0339 08/03/18 0708 08/04/18 0720  WBC 7.8 12.1* 7.2  12.3* 8.6  --   --   NEUTROABS 5.8 10.9*  --  10.3*  --   --   --   HGB 11.3* 11.6* 10.7* 10.8* 9.7* 8.3* 8.9*  HCT 35.0* 36.9 33.0* 35.8* 31.0* 26.2* 28.2*  MCV 92.1 93.4 91.9 96.5 90.4  --   --   PLT 299 248 244 231 196  --   --    Cardiac Enzymes: Recent Labs    05/31/18 0837 05/31/18 1214 05/31/18 1951 06/01/18 0125  CKTOTAL 172  --   --   --   TROPONINI  --  0.98* 0.79* 0.64*   BNP: Invalid input(s): POCBNP Lab Results  Component Value Date   HGBA1C (H) 02/17/2010    6.8 (NOTE)                                                                       According to the ADA Clinical Practice Recommendations for 2011, when HbA1c is used as a screening test:   >=6.5%   Diagnostic of Diabetes Mellitus           (if abnormal result  is confirmed)  5.7-6.4%   Increased risk of developing Diabetes Mellitus  References:Diagnosis and Classification of Diabetes Mellitus,Diabetes Care,2011,34(Suppl 1):S62-S69 and Standards of Medical Care in         Diabetes - 2011,Diabetes Care,2011,34  (Suppl 1):S11-S61.   Lab Results  Component Value Date   TSH 2.959 05/31/2018   No results found for: VITAMINB12 No results found for: FOLATE No results found for: IRON, TIBC, FERRITIN  Imaging and Procedures obtained prior to SNF admission: Dg Chest 1 View  Result Date: 07/30/2018 CLINICAL DATA:  Preop after fall.  Baseline dementia. EXAM: CHEST  1 VIEW COMPARISON:  05/31/2018 FINDINGS: The lung volumes are low similar in appearance to prior. There is stable cardiomegaly with aortic atherosclerosis. Central pulmonary vascular congestion and mild interstitial edema is noted though less prominent than on prior. No apparent pulmonary consolidation to suggest pneumonia. No effusion or pneumothorax. No acute osseous abnormality. IMPRESSION: Low lung volumes with mild interstitial edema. Stable cardiomegaly with aortic atherosclerosis. Electronically Signed   By: Tollie Eth M.D.   On: 07/30/2018 21:42   Ct  Head Wo Contrast  Result Date: 07/30/2018 CLINICAL DATA:  Dementia patient post unwitnessed fall. Head trauma, ataxia; C-spine trauma, high clinical risk (NEXUS/CCR) EXAM: CT HEAD WITHOUT CONTRAST CT CERVICAL  SPINE WITHOUT CONTRAST TECHNIQUE: Multidetector CT imaging of the head and cervical spine was performed following the standard protocol without intravenous contrast. Multiplanar CT image reconstructions of the cervical spine were also generated. COMPARISON:  Head CT 05/31/2018, head and cervical spine CT 11/30/2017 FINDINGS: CT HEAD FINDINGS Brain: Unchanged atrophy and chronic small vessel ischemia. No intracranial hemorrhage, mass effect, or midline shift. No hydrocephalus. The basilar cisterns are patent. No evidence of territorial infarct or acute ischemia. No extra-axial or intracranial fluid collection. Vascular: Atherosclerosis of skullbase vasculature without hyperdense vessel or abnormal calcification. Skull: No fracture or focal lesion. Sinuses/Orbits: Paranasal sinuses and mastoid air cells are clear. The visualized orbits are unremarkable. Other: None. CT CERVICAL SPINE FINDINGS Alignment: No traumatic subluxation. Trace anterolisthesis of C7 on T1 is unchanged from prior exam and likely degenerative. Skull base and vertebrae: No acute fracture. Vertebral body heights are maintained. The dens and skull base are intact. Soft tissues and spinal canal: No prevertebral fluid or swelling. No visible canal hematoma. Disc levels: Diffuse disc space narrowing and endplate spurring. Scattered facet arthropathy. Degenerative changes are similar to prior exam. Upper chest: Patulous upper esophagus. Other: None. IMPRESSION: 1. No acute intracranial abnormality. No skull fracture. Stable atrophy and chronic small vessel ischemia. 2. Multilevel degenerative change in the cervical spine without acute fracture or subluxation. Electronically Signed   By: Narda RutherfordMelanie  Sanford M.D.   On: 07/30/2018 22:06   Ct Cervical  Spine Wo Contrast  Result Date: 07/30/2018 CLINICAL DATA:  Dementia patient post unwitnessed fall. Head trauma, ataxia; C-spine trauma, high clinical risk (NEXUS/CCR) EXAM: CT HEAD WITHOUT CONTRAST CT CERVICAL SPINE WITHOUT CONTRAST TECHNIQUE: Multidetector CT imaging of the head and cervical spine was performed following the standard protocol without intravenous contrast. Multiplanar CT image reconstructions of the cervical spine were also generated. COMPARISON:  Head CT 05/31/2018, head and cervical spine CT 11/30/2017 FINDINGS: CT HEAD FINDINGS Brain: Unchanged atrophy and chronic small vessel ischemia. No intracranial hemorrhage, mass effect, or midline shift. No hydrocephalus. The basilar cisterns are patent. No evidence of territorial infarct or acute ischemia. No extra-axial or intracranial fluid collection. Vascular: Atherosclerosis of skullbase vasculature without hyperdense vessel or abnormal calcification. Skull: No fracture or focal lesion. Sinuses/Orbits: Paranasal sinuses and mastoid air cells are clear. The visualized orbits are unremarkable. Other: None. CT CERVICAL SPINE FINDINGS Alignment: No traumatic subluxation. Trace anterolisthesis of C7 on T1 is unchanged from prior exam and likely degenerative. Skull base and vertebrae: No acute fracture. Vertebral body heights are maintained. The dens and skull base are intact. Soft tissues and spinal canal: No prevertebral fluid or swelling. No visible canal hematoma. Disc levels: Diffuse disc space narrowing and endplate spurring. Scattered facet arthropathy. Degenerative changes are similar to prior exam. Upper chest: Patulous upper esophagus. Other: None. IMPRESSION: 1. No acute intracranial abnormality. No skull fracture. Stable atrophy and chronic small vessel ischemia. 2. Multilevel degenerative change in the cervical spine without acute fracture or subluxation. Electronically Signed   By: Narda RutherfordMelanie  Sanford M.D.   On: 07/30/2018 22:06   Dg C-arm  1-60 Min  Result Date: 07/31/2018 CLINICAL DATA:  INTRAMEDULLARY (IM) NAIL INTERTROCHANTRIC - LEFT Dr. Lytle MichaelsHaddix [place] OR room 3 1 minute 20 seconds fluoro time 5 images and dose summary saved to PACS EXAM: OPERATIVE LEFT HIP (WITH PELVIS IF PERFORMED) 5 VIEWS TECHNIQUE: Fluoroscopic spot image(s) were submitted for interpretation post-operatively. COMPARISON:  None FINDINGS: Images are performed prior to and during placement of lag screw and intramedullary nail across it  has intertrochanteric fracture. No dislocation or interval fracture. IMPRESSION: ORIF of the left hip. Electronically Signed   By: Norva Pavlov M.D.   On: 07/31/2018 10:42   Dg Hip Operative Unilat W Or W/o Pelvis Left  Result Date: 07/31/2018 CLINICAL DATA:  INTRAMEDULLARY (IM) NAIL INTERTROCHANTRIC - LEFT Dr. Lytle Michaels [place] OR room 3 1 minute 20 seconds fluoro time 5 images and dose summary saved to PACS EXAM: OPERATIVE LEFT HIP (WITH PELVIS IF PERFORMED) 5 VIEWS TECHNIQUE: Fluoroscopic spot image(s) were submitted for interpretation post-operatively. COMPARISON:  None FINDINGS: Images are performed prior to and during placement of lag screw and intramedullary nail across it has intertrochanteric fracture. No dislocation or interval fracture. IMPRESSION: ORIF of the left hip. Electronically Signed   By: Norva Pavlov M.D.   On: 07/31/2018 10:42   Dg Hip Unilat W Or W/o Pelvis 2-3 Views Left  Result Date: 07/31/2018 CLINICAL DATA:  Post op left hip surgery - short nail placement EXAM: DG HIP (WITH OR WITHOUT PELVIS) 2-3V LEFT COMPARISON:  11/14/2018 FINDINGS: Lag screw and intramedullary nail transfixes an intertrochanteric fracture. Hardware is intact and appears well-seated. No interval fractures. Degenerative changes are seen in the SI joints. Degenerative changes in the RIGHT hip. IMPRESSION: Status post ORIF of the left hip. Electronically Signed   By: Norva Pavlov M.D.   On: 07/31/2018 12:31   Dg Hip Unilat W Or Wo  Pelvis 2-3 Views Left  Result Date: 07/30/2018 CLINICAL DATA:  Preop left hip fracture EXAM: DG HIP (WITH OR WITHOUT PELVIS) 2-3V LEFT COMPARISON:  None. FINDINGS: Acute, closed, intertrochanteric fracture of the left hip is noted. No joint dislocation is identified. The fracture also appears undermining the greater trochanter. No pelvic fracture. 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 Intact right hip and femur. Lower lumbar degenerative disc and facet arthropathy is seen from L4 through S1. Pelvic phleboliths are present. IMPRESSION: Acute, closed, intertrochanteric fracture of the left femur with fracture also undermining the greater trochanter. No significant angulation and slight displacement is seen. Electronically Signed   By: Tollie Eth M.D.   On: 07/30/2018 21:46    Assessment/Plan 1. Primary progressive aphasia (HCC) -typically proceeds FTD; she certainly has aphasia   2. Frontotemporal dementia (HCC) -might consider nuedexta for her if affordable in this instance -right now prn ativan in place for agitation when already has buspar  3. Depression, unspecified depression type -continues on zoloft for depression and buspar for anxiety--? Any benefit to nuedexta for her with her frontotemporal disease and rapid mood changes--?pseudobulbar affect  4. Acute blood loss anemia -f/u cbc pending  5. Coronary artery disease involving native coronary artery of native heart with angina pectoris (HCC) -prior MI x 2 -no longer on secondary preventative meds with her advanced dementia  6. Closed displaced intertrochanteric fracture of left femur, sequela -finishing off last week of hydrocodone--hopefully cognition will be better (less agitation) w/o it; if there are pain control concerns, tylenol may be used and topical therapy, heat or ice  7. Age-related osteoporosis with current pathological fracture with routine healing, subsequent encounter -continues on vitamin D  therapy; ideally should be on meds postop but dementia is quite advanced and she's not ambulatory (though she tries to get up and at risk of falls)--typically I do not treat those that are not ambulating  8. History of gastrectomy -cont with mechanical soft diet and small frequent meals  9. Essential hypertension -bp at goal--not on meds now, monitor  10.  Hordeolum externum of left upper eyelid -resolved   Family/ staff Communication: discussed with SNF nursing; NP note reviewed (had seen due to medications not matching on FL2 vs med list upon arrival)  Labs/tests ordered:  NP ordered labs pending  Ayomikun Starling L. Anarie Kalish, D.O. Geriatrics [facility] [address], [place] [address] Cell Phone (Mon-Fri 8am-5pm):  [phone] On Call:  [phone] & follow prompts after 5pm & weekends Office Phone:  [phone] Office Fax:  [phone]

## 2018-10-23 ENCOUNTER — Non-Acute Institutional Stay (SKILLED_NURSING_FACILITY): Payer: Medicare Other | Admitting: Adult Health

## 2018-10-23 ENCOUNTER — Encounter: Payer: Self-pay | Admitting: Adult Health

## 2018-10-23 ENCOUNTER — Other Ambulatory Visit: Payer: Self-pay | Admitting: Adult Health

## 2018-10-23 DIAGNOSIS — G3109 Other frontotemporal dementia: Secondary | ICD-10-CM

## 2018-10-23 DIAGNOSIS — F0281 Dementia in other diseases classified elsewhere with behavioral disturbance: Secondary | ICD-10-CM

## 2018-10-23 DIAGNOSIS — F02818 Dementia in other diseases classified elsewhere, unspecified severity, with other behavioral disturbance: Secondary | ICD-10-CM

## 2018-10-23 MED ORDER — QUETIAPINE FUMARATE 25 MG PO TABS: 25.0000 mg | ORAL_TABLET | Freq: Two times a day (BID) | ORAL | 11 refills | Status: DC

## 2018-10-23 NOTE — Progress Notes (Signed)
Location:  Medical illustrator of Service:  SNF (31) Provider:   Peggye Ley, ANP [facility] [phone]   Kermit Balo, DO  Patient Care Team: Kermit Balo, DO as PCP - General (Geriatric Medicine) Fletcher Anon, NP as Nurse Practitioner (Nurse Practitioner)  Extended Emergency Contact Information Primary Emergency Contact: Blake Divine Address: [address]          [place], [place] [address] [place] of [place] Work Phone: [phone] Mobile Phone: [phone] Relation: Son Secondary Emergency Contact: [place] Poot [place] of [place] Home Phone: [phone] Relation: Son  Code Status:  DNR Goals of care: Advanced Directive information Advanced Directives 10/20/2018  Does Patient Have a Medical Advance Directive? -  Type of Estate agent of [place];Living will;Out of facility DNR (pink MOST or yellow form)  Does patient want to make changes to medical advance directive? No - Patient declined  Copy of Healthcare Power of Attorney in Chart? Yes - validated most recent copy scanned in chart (See row information)  Pre-existing out of facility DNR order (yellow form or pink MOST form) Yellow form placed in chart (order not valid for inpatient use)     Chief Complaint  Patient presents with  . Acute Visit    agitation     HPI:  Pt is a 85 y.o. female seen today for an acute visit for agitation. Originally she had a diagnosis of vascular dementia and aphasia but her family reports she was diagnosed with frontotemporal dementia. She is currently in skilled care at [name] after a hospitalization and rehab stay due to a hip fracture and surgery in march of 2020. Since her arrival one week ago she has become increasingly agitated in the evening. She makes attempts to get out of bed without help, cries and appears to see men that are not there and throws things in her room. Her family reports she has  a hx of depression and anxiety which has worsened over time. She was apparently experiencing issues similar at her other facility but these symptoms have worsened. The nurse found her last night with a sheet loosely tied around her neck. She did not appear to be trying to intentionally harm herself.  Her vitals have been stable. Her appetite has been fair. All labs ordered at [name] have not shown any reason for this behavior which seems to be progressive rather than acute.  Past Medical History:  Diagnosis Date  . Angina pectoris (HCC)    intrascapular, abnormal cardiolote preop TKR  . Anxiety   . Arthritis   . ASCVD (arteriosclerotic cardiovascular disease)    S/P NSTEMI with LCX stent 2008, cath on 06/29/2008 revealed widely patent circumflex  . Bacterial infection 2016  . Coronary artery disease   . Depression   . Diabetes mellitus   . GERD (gastroesophageal reflux disease)   . Heart attack (HCC) 2010  . Hyperlipidemia   . Hypertension   . Hypothyroidism   . Myocardial infarction [facility]) 4580,9983   with stents  . Osteoporosis   . Rheumatoid arthritis (HCC) 2013  . Trigger finger 02/2013  . Vascular dementia (HCC)   . Vertigo 2016  . Vitamin D deficiency    Past Surgical History:  Procedure Laterality Date  . ABDOMINAL HYSTERECTOMY  1969  . BREAST BIOPSY  2005   right -benign  . BREAST SURGERY  2004   right -cancerous-with radiation  . broken finger  2006  . CORONARY ANGIOPLASTY  2008,  2010  . foot ampuation  12/2009   right  . HAMMER TOE SURGERY  06/2009   right  . HERNIA REPAIR  [id number],2001  . INTRAMEDULLARY (IM) NAIL INTERTROCHANTERIC Left 07/31/2018   Procedure: INTRAMEDULLARY (IM) NAIL INTERTROCHANTRIC;  Surgeon: Roby LoftsHaddix, Kevin P, MD;  Location: [facility] OR;  Service: Orthopedics;  Laterality: Left;  . KNEE ARTHROSCOPY  1980 ,1994   bilateral  . TOTAL GASTRECTOMY  1983   with vagotomy  . TOTAL KNEE ARTHROPLASTY  02/26/2012   Procedure: TOTAL KNEE ARTHROPLASTY;  Surgeon:  Jacki Conesonald A Gioffre, MD;  Location: [facility] ORS;  Service: Orthopedics;  Laterality: Right;    No Known Allergies  Outpatient Encounter Medications as of 10/23/2018  Medication Sig  . acetaminophen (TYLENOL) 325 MG tablet Take 650 mg by mouth every 6 (six) hours as needed.  . busPIRone (BUSPAR) 15 MG tablet Take 15 mg by mouth 3 (three) times daily.  . Cholecalciferol (VITAMIN D) 50 MCG (2000 UT) tablet Take 2,000 Units by mouth daily.  Marland Kitchen. HYDROcodone-acetaminophen (NORCO/VICODIN) 5-325 MG tablet Take 1 tablet by mouth every 6 (six) hours as needed for moderate pain or severe pain.  Marland Kitchen. LORazepam (ATIVAN) 0.5 MG tablet Take 0.5 mg by mouth 2 (two) times daily as needed for anxiety. X 14 days  . QUEtiapine (SEROQUEL) 25 MG tablet Take 1 tablet (25 mg total) by mouth 2 (two) times daily.  . sertraline (ZOLOFT) 100 MG tablet Take 100 mg by mouth daily.   No facility-administered encounter medications on file as of 10/23/2018.     Review of Systems  Unable to perform ROS: Dementia    Immunization History  Administered Date(s) Administered  . Influenza, High Dose Seasonal PF 04/03/2017  . Influenza-Unspecified 08/06/2018  . PPD Test 10/27/2017  . Pneumococcal Conjugate-13 12/02/2014  . Pneumococcal Polysaccharide-23 05/27/2005   Pertinent  Health Maintenance Due  Topic Date Due  . INFLUENZA VACCINE  12/26/2018  . DEXA SCAN  Completed  . PNA vac Low Risk Adult  Completed   Fall Risk  10/16/2018  Falls in the past year? 1  Number falls in past yr: 1  Injury with Fall? 1  Risk for fall due to : History of fall(s);Impaired mobility  Follow up Falls evaluation completed;Falls prevention discussed   Functional Status Survey:    Vitals:   10/23/18 1112  BP: (!) 101/58  Pulse: 64  Resp: 18  Temp: 97.6 F (36.4 C)  SpO2: 96%   There is no height or weight on file to calculate BMI. Physical Exam Vitals signs and nursing note reviewed.  Constitutional:      General: She is not in acute  distress.    Appearance: She is not diaphoretic.  HENT:     Head: Normocephalic and atraumatic.  Neck:     Vascular: No JVD.  Cardiovascular:     Rate and Rhythm: Normal rate and regular rhythm.     Heart sounds: No murmur.  Pulmonary:     Effort: Pulmonary effort is normal. No respiratory distress.     Breath sounds: Normal breath sounds. No wheezing.  Abdominal:     General: Abdomen is flat. Bowel sounds are normal. There is no distension.  Musculoskeletal:     Right lower leg: No edema.     Left lower leg: No edema.  Skin:    General: Skin is warm and dry.  Neurological:     Mental Status: She is alert.     Comments: Not able to follow commands  or answer q's. Rambling No obvious focal deficits.      Labs reviewed: Recent Labs    05/31/18 0837  08/02/18 0524 08/03/18 0708 08/04/18 0720 10/20/18 0600  NA 138   < > 138 140 140 141  K 3.8   < > 3.4* 3.3* 3.5 4.4  CL 107   < > 110 109 107  --   CO2 23   < > 22 23 23   --   GLUCOSE 123*   < > 121* 104* 98  --   BUN 14   < > 11 13 11 12   CREATININE 0.66   < > 0.58 0.66 0.56 0.6  CALCIUM 8.4*   < > 8.0* 8.0* 8.3*  --   MG 1.7  --   --  1.7  --   --    < > = values in this interval not displayed.   Recent Labs    05/31/18 0837 07/30/18 2200 10/20/18 0600  AST 23 19 13   ALT 15 16 6*  ALKPHOS 84 94 125  BILITOT 0.6 0.8  --   PROT 5.9* 6.7  --   ALBUMIN 3.2* 3.7  --    Recent Labs    11/30/17 1420 05/31/18 0837 06/01/18 0125 07/30/18 2200 08/01/18 0339 08/03/18 0708 08/04/18 0720 10/20/18 0600  WBC 7.8 12.1* 7.2 12.3* 8.6  --   --  6.8  NEUTROABS 5.8 10.9*  --  10.3*  --   --   --   --   HGB 11.3* 11.6* 10.7* 10.8* 9.7* 8.3* 8.9* 11.2*  HCT 35.0* 36.9 33.0* 35.8* 31.0* 26.2* 28.2* 34*  MCV 92.1 93.4 91.9 96.5 90.4  --   --   --   PLT 299 248 244 231 196  --   --  254   Lab Results  Component Value Date   TSH 3.16 10/20/2018   Lab Results  Component Value Date   HGBA1C 6.2 10/20/2018   Lab Results   Component Value Date   CHOL  12/26/2006    175        ATP III CLASSIFICATION:  <200     mg/dL   Desirable  161-096200-239  mg/dL   Borderline High  >=045>=240    mg/dL   High   HDL 45 40/98/119108/05/2006   LDLCALC (H) 12/26/2006    109        Total Cholesterol/HDL:CHD Risk Coronary Heart Disease Risk Table                     Men   Women  1/2 Average Risk   3.4   3.3   TRIG 103 12/26/2006   CHOLHDL 3.9 12/26/2006    Significant Diagnostic Results in last 30 days:  No results found.  Assessment/Plan 1. Frontotemporal dementia with behavioral disturbance [facility](HCC) I called her son Dahlia BailiffFrancis Paszkiewicz and discussed the concerns regarding Ms. Murdy's behaviors. We weighed the risk and benefit of beginning medication. At this point she is of potential harm to herself and has a poor quality of life. Her son was educated on the black box warning surrounding the use of seroquel, as well as potential benefits. We agreed to "start low and go slow" to help treat her hallucinations and agitation. She has had a very difficult few months with surgery/anesthesia, change in environment, and social isolation. Will continue to follow for improvement in mood.     Family/ staff Communication: discussed with her son Dahlia BailiffFrancis Viger  Labs/tests  ordered:  NA

## 2018-11-16 ENCOUNTER — Encounter: Payer: Self-pay | Admitting: Adult Health

## 2018-11-16 ENCOUNTER — Non-Acute Institutional Stay (SKILLED_NURSING_FACILITY): Payer: Medicare Other | Admitting: Adult Health

## 2018-11-16 DIAGNOSIS — F419 Anxiety disorder, unspecified: Secondary | ICD-10-CM

## 2018-11-16 DIAGNOSIS — F0280 Dementia in other diseases classified elsewhere without behavioral disturbance: Secondary | ICD-10-CM

## 2018-11-16 DIAGNOSIS — D62 Acute posthemorrhagic anemia: Secondary | ICD-10-CM | POA: Diagnosis not present

## 2018-11-16 DIAGNOSIS — I10 Essential (primary) hypertension: Secondary | ICD-10-CM

## 2018-11-16 DIAGNOSIS — G3109 Other frontotemporal dementia: Secondary | ICD-10-CM

## 2018-11-16 DIAGNOSIS — R2681 Unsteadiness on feet: Secondary | ICD-10-CM

## 2018-11-16 HISTORY — DX: Dementia in other diseases classified elsewhere, unspecified severity, without behavioral disturbance, psychotic disturbance, mood disturbance, and anxiety: F02.80

## 2018-11-16 HISTORY — DX: Other frontotemporal neurocognitive disorder: G31.09

## 2018-11-16 NOTE — Progress Notes (Signed)
Location:  Medical illustrator of Service:  SNF (31) Provider:   Peggye Ley, ANP [facility] [phone]   Kermit Balo, DO  Patient Care Team: Kermit Balo, DO as PCP - General (Geriatric Medicine) Fletcher Anon, NP as Nurse Practitioner (Nurse Practitioner)  Extended Emergency Contact Information Primary Emergency Contact: Blake Divine Address: [address]          [place], [place] [address] [place] of [place] Work Phone: [phone] Mobile Phone: [phone] Relation: Son Secondary Emergency Contact: [place] Home Phone: [phone] Relation: Son  Code Status:  DNR Goals of care: Advanced Directive information Advanced Directives 10/20/2018  Does Patient Have a Medical Advance Directive? -  Type of Estate agent of [place];Living will;Out of facility DNR (pink MOST or yellow form)  Does patient want to make changes to medical advance directive? No - Patient declined  Copy of Healthcare Power of Attorney in Chart? Yes - validated most recent copy scanned in chart (See row information)  Pre-existing out of facility DNR order (yellow form or pink MOST form) Yellow form placed in chart (order not valid for inpatient use)     Chief Complaint  Patient presents with   Medical Management of Chronic Issues    HPI:  Pt is a 85 y.o. female seen today for medical management of chronic diseases.  She has a hx of frontotemporal dementia and was placed on seroquel last month due to hallucinations and severe evening agitation. These symptoms are much improved but do still occur typically in the evening. The staff tries to re direct her and keep her engaged. She is moving to the memory care unit as she is now ambulatory with assistance and an elopement risk. S/P left hip fracture with repair in March of 2020. No reports of pain or swelling reported.  She is working with PT at this  time. Some mild shoulder discomfort noted during the visit.    Past Medical History:  Diagnosis Date   Angina pectoris (HCC)    intrascapular, abnormal cardiolote preop TKR   Anxiety    Arthritis    ASCVD (arteriosclerotic cardiovascular disease)    S/P NSTEMI with LCX stent 2008, cath on 06/29/2008 revealed widely patent circumflex   Bacterial infection 2016   Coronary artery disease    Depression    Diabetes mellitus    Frontotemporal dementia (HCC) 11/16/2018   GERD (gastroesophageal reflux disease)    Heart attack (HCC) 2010   Hyperlipidemia    Hypertension    Hypothyroidism    Myocardial infarction (HCC) 7616,0737   with stents   Osteoporosis    Rheumatoid arthritis (HCC) 2013   Trigger finger 02/2013   Vascular dementia (HCC)    Vertigo 2016   Vitamin D deficiency    Past Surgical History:  Procedure Laterality Date   ABDOMINAL HYSTERECTOMY  1969   BREAST BIOPSY  2005   right -benign   BREAST SURGERY  2004   right -cancerous-with radiation   broken finger  2006   CORONARY ANGIOPLASTY  2008, 2010   foot ampuation  12/2009   right   HAMMER TOE SURGERY  06/2009   right   HERNIA REPAIR  1999,2001   INTRAMEDULLARY (IM) NAIL INTERTROCHANTERIC Left 07/31/2018   Procedure: INTRAMEDULLARY (IM) NAIL INTERTROCHANTRIC;  Surgeon: Roby Lofts, MD;  Location: [facility] OR;  Service: Orthopedics;  Laterality: Left;   KNEE ARTHROSCOPY  1980 ,1994   bilateral  TOTAL GASTRECTOMY  1983   with vagotomy   TOTAL KNEE ARTHROPLASTY  02/26/2012   Procedure: TOTAL KNEE ARTHROPLASTY;  Surgeon: Jacki Conesonald A Gioffre, MD;  Location: [facility] ORS;  Service: Orthopedics;  Laterality: Right;    No Known Allergies  Outpatient Encounter Medications as of 11/16/2018  Medication Sig   acetaminophen (TYLENOL) 325 MG tablet Take 650 mg by mouth every 6 (six) hours as needed.   busPIRone (BUSPAR) 15 MG tablet Take 15 mg by mouth 3 (three) times daily.   Cholecalciferol  (VITAMIN D) 50 MCG (2000 UT) tablet Take 2,000 Units by mouth daily.   LORazepam (ATIVAN) 0.5 MG tablet Take 0.5 mg by mouth 2 (two) times daily as needed for anxiety. X 14 days   QUEtiapine (SEROQUEL) 25 MG tablet Take 1 tablet (25 mg total) by mouth 2 (two) times daily.   sertraline (ZOLOFT) 100 MG tablet Take 100 mg by mouth daily.   [DISCONTINUED] HYDROcodone-acetaminophen (NORCO/VICODIN) 5-325 MG tablet Take 1 tablet by mouth every 6 (six) hours as needed for moderate pain or severe pain.   No facility-administered encounter medications on file as of 11/16/2018.     Review of Systems  Constitutional: Negative for activity change, appetite change, chills, diaphoresis, fatigue, fever and unexpected weight change.  HENT: Negative for congestion.   Respiratory: Negative for cough, shortness of breath and wheezing.   Cardiovascular: Negative for chest pain, palpitations and leg swelling.  Gastrointestinal: Negative for abdominal distention, abdominal pain, constipation and diarrhea.  Genitourinary: Negative for difficulty urinating and dysuria.  Musculoskeletal: Negative for arthralgias, back pain, gait problem, joint swelling and myalgias.  Neurological: Negative for dizziness, tremors, seizures, syncope, facial asymmetry, speech difficulty, weakness, light-headedness, numbness and headaches.  Psychiatric/Behavioral: Negative for agitation, behavioral problems and confusion.    Immunization History  Administered Date(s) Administered   Influenza, High Dose Seasonal PF 04/03/2017   Influenza-Unspecified 08/06/2018   PPD Test 10/27/2017   Pneumococcal Conjugate-13 12/02/2014   Pneumococcal Polysaccharide-23 05/27/2005   Pertinent  Health Maintenance Due  Topic Date Due   INFLUENZA VACCINE  12/26/2018   DEXA SCAN  Completed   PNA vac Low Risk Adult  Completed   Fall Risk  10/16/2018  Falls in the past year? 1  Number falls in past yr: 1  Injury with Fall? 1  Risk for  fall due to : History of fall(s);Impaired mobility  Follow up Falls evaluation completed;Falls prevention discussed   Functional Status Survey:    Vitals:   11/16/18 1533  Weight: 123 lb 9.6 oz (56.1 kg)   Body mass index is 21.22 kg/m. Physical Exam Vitals signs and nursing note reviewed.  Constitutional:      General: She is not in acute distress.    Appearance: She is not diaphoretic.  HENT:     Head: Normocephalic and atraumatic.  Neck:     Vascular: No JVD.  Cardiovascular:     Rate and Rhythm: Normal rate and regular rhythm.     Heart sounds: No murmur.  Pulmonary:     Effort: Pulmonary effort is normal. No respiratory distress.     Breath sounds: Normal breath sounds. No wheezing.  Abdominal:     General: Bowel sounds are normal. There is no distension.     Palpations: Abdomen is soft.  Musculoskeletal:        General: No swelling or tenderness.     Right lower leg: No edema.     Left lower leg: No edema.  Comments: Mild grimacing with ROM to the right shoulder. NO swelling, tenderness, or deformity noted.   Skin:    General: Skin is warm and dry.  Neurological:     General: No focal deficit present.     Mental Status: She is alert. Mental status is at baseline.     Comments: Not able to follow commands. Alert and talks frequently but can not answer questions.      Labs reviewed: Recent Labs    05/31/18 0837  08/02/18 0524 08/03/18 0708 08/04/18 0720 10/20/18 0600  NA 138   < > 138 140 140 141  K 3.8   < > 3.4* 3.3* 3.5 4.4  CL 107   < > 110 109 107  --   CO2 23   < > 22 23 23   --   GLUCOSE 123*   < > 121* 104* 98  --   BUN 14   < > 11 13 11 12   CREATININE 0.66   < > 0.58 0.66 0.56 0.6  CALCIUM 8.4*   < > 8.0* 8.0* 8.3*  --   MG 1.7  --   --  1.7  --   --    < > = values in this interval not displayed.   Recent Labs    05/31/18 0837 07/30/18 2200 10/20/18 0600  AST 23 19 13   ALT 15 16 6*  ALKPHOS 84 94 125  BILITOT 0.6 0.8  --   PROT  5.9* 6.7  --   ALBUMIN 3.2* 3.7  --    Recent Labs    11/30/17 1420 05/31/18 0837 06/01/18 0125 07/30/18 2200 08/01/18 0339 08/03/18 0708 08/04/18 0720 10/20/18 0600  WBC 7.8 12.1* 7.2 12.3* 8.6  --   --  6.8  NEUTROABS 5.8 10.9*  --  10.3*  --   --   --   --   HGB 11.3* 11.6* 10.7* 10.8* 9.7* 8.3* 8.9* 11.2*  HCT 35.0* 36.9 33.0* 35.8* 31.0* 26.2* 28.2* 34*  MCV 92.1 93.4 91.9 96.5 90.4  --   --   --   PLT 299 248 244 231 196  --   --  254   Lab Results  Component Value Date   TSH 3.16 10/20/2018   Lab Results  Component Value Date   HGBA1C 6.2 10/20/2018   Lab Results  Component Value Date   CHOL  12/26/2006    175        ATP III CLASSIFICATION:  <200     mg/dL   Desirable  200-239  mg/dL   Borderline High  >=240    mg/dL   High   HDL 45 12/26/2006   LDLCALC (H) 12/26/2006    109        Total Cholesterol/HDL:CHD Risk Coronary Heart Disease Risk Table                     Men   Women  1/2 Average Risk   3.4   3.3   TRIG 103 12/26/2006   CHOLHDL 3.9 12/26/2006    Significant Diagnostic Results in last 30 days:  No results found.  Assessment/Plan 1. Frontotemporal dementia ([place]) Improvement in agitation and hallucinations with the initiation of Seroquel. Will continue at 25 mg bid.   2. Essential hypertension Controlled   3. Anxiety Improved, continue Buspar 15 mg tid   4. Acute blood loss anemia Improved Lab Results  Component Value Date   HGB 11.2 (A) 10/20/2018  5. Gait instability She is a high fall risk and needs to have someone with her for ambulation at all times.    Family/ staff Communication: staff  Labs/tests ordered:  NA

## 2018-11-18 ENCOUNTER — Encounter: Payer: Self-pay | Admitting: Internal Medicine

## 2018-11-30 ENCOUNTER — Other Ambulatory Visit: Payer: Self-pay | Admitting: Adult Health

## 2018-11-30 MED ORDER — LORAZEPAM 0.5 MG PO TABS: 0.5000 mg | ORAL_TABLET | Freq: Two times a day (BID) | ORAL | 0 refills | Status: DC | PRN

## 2018-12-10 ENCOUNTER — Non-Acute Institutional Stay (SKILLED_NURSING_FACILITY): Payer: Medicare Other | Admitting: Adult Health

## 2018-12-10 ENCOUNTER — Encounter: Payer: Self-pay | Admitting: Adult Health

## 2018-12-10 DIAGNOSIS — G3109 Other frontotemporal dementia: Secondary | ICD-10-CM

## 2018-12-10 DIAGNOSIS — I25119 Atherosclerotic heart disease of native coronary artery with unspecified angina pectoris: Secondary | ICD-10-CM

## 2018-12-10 DIAGNOSIS — F419 Anxiety disorder, unspecified: Secondary | ICD-10-CM

## 2018-12-10 DIAGNOSIS — F0280 Dementia in other diseases classified elsewhere without behavioral disturbance: Secondary | ICD-10-CM

## 2018-12-10 DIAGNOSIS — I10 Essential (primary) hypertension: Secondary | ICD-10-CM

## 2018-12-10 DIAGNOSIS — M8000XD Age-related osteoporosis with current pathological fracture, unspecified site, subsequent encounter for fracture with routine healing: Secondary | ICD-10-CM

## 2018-12-10 NOTE — Progress Notes (Signed)
Location:  Medical illustrator of Service:  SNF (31) Provider:   Peggye Ley, ANP [facility] [phone]  Kermit Balo, DO  Patient Care Team: Kermit Balo, DO as PCP - General (Geriatric Medicine) Fletcher Anon, NP as Nurse Practitioner (Nurse Practitioner)  Extended Emergency Contact Information Primary Emergency Contact: Blake Divine Address: [address]          [place], [place] [address] Darden Amber of [place] Work Phone: [phone] Mobile Phone: [phone] Relation: Son Secondary Emergency Contact: Charleston Poot [place] of [place] Home Phone: [phone] Relation: Son  Code Status:  DNR Goals of care: Advanced Directive information Advanced Directives 10/20/2018  Does Patient Have a Medical Advance Directive? -  Type of Estate agent of [place];Living will;Out of facility DNR (pink MOST or yellow form)  Does patient want to make changes to medical advance directive? No - Patient declined  Copy of Healthcare Power of Attorney in Chart? Yes - validated most recent copy scanned in chart (See row information)  Pre-existing out of facility DNR order (yellow form or pink MOST form) Yellow form placed in chart (order not valid for inpatient use)     Chief Complaint  Patient presents with  . Medical Management of Chronic Issues    HPI:  Pt is a 85 y.o. female seen today for medical management of chronic diseases.   She has a hx of FTD and resides in skilled care. Last month she was moved to memory care as it was felt that she was an elopement risk. She did not adjust well there and had an increase in behaviors and anxiety. She has not adjusted back to skilled care and is not an elopement risk. She is not ambulatory now and but continues to work with therapy. She is not c/w her performance in the stand up lift and at times needs the hoyer lift. She is able to propel herself with her feet in a  supportive chair. She is not having any joint pain.  BPs reviewed in matrix are WNL. Her weight has dropped by 9 lbs in the past month which may be due to the use of a different scale or the anxiety that she experienced during her move.   Staff have not used the ativan that is ordered as needed for anxiety in the past week.    Past Medical History:  Diagnosis Date  . Angina pectoris (HCC)    intrascapular, abnormal cardiolote preop TKR  . Anxiety   . Arthritis   . ASCVD (arteriosclerotic cardiovascular disease)    S/P NSTEMI with LCX stent 2008, cath on 06/29/2008 revealed widely patent circumflex  . Bacterial infection 2016  . Coronary artery disease   . Depression   . Diabetes mellitus   . Frontotemporal dementia (HCC) 11/16/2018  . GERD (gastroesophageal reflux disease)   . Heart attack (HCC) 2010  . Hyperlipidemia   . Hypertension   . Hypothyroidism   . Myocardial infarction [facility]) [phone]   with stents  . Osteoporosis   . Rheumatoid arthritis (HCC) 2013  . Trigger finger 02/2013  . Vascular dementia (HCC)   . Vertigo 2016  . Vitamin D deficiency    Past Surgical History:  Procedure Laterality Date  . ABDOMINAL HYSTERECTOMY  1969  . BREAST BIOPSY  2005   right -benign  . BREAST SURGERY  2004   right -cancerous-with radiation  . broken finger  2006  . CORONARY ANGIOPLASTY  2008, 2010  .  foot ampuation  12/2009   right  . HAMMER TOE SURGERY  06/2009   right  . HERNIA REPAIR  [id number]  . INTRAMEDULLARY (IM) NAIL INTERTROCHANTERIC Left 07/31/2018   Procedure: INTRAMEDULLARY (IM) NAIL INTERTROCHANTRIC;  Surgeon: Roby Lofts, MD;  Location: [facility] OR;  Service: Orthopedics;  Laterality: Left;  . KNEE ARTHROSCOPY  1980 ,1994   bilateral  . TOTAL GASTRECTOMY  1983   with vagotomy  . TOTAL KNEE ARTHROPLASTY  02/26/2012   Procedure: TOTAL KNEE ARTHROPLASTY;  Surgeon: Jacki Cones, MD;  Location: [facility] ORS;  Service: Orthopedics;  Laterality: Right;    No Known  Allergies  Outpatient Encounter Medications as of 12/10/2018  Medication Sig  . acetaminophen (TYLENOL) 325 MG tablet Take 650 mg by mouth every 6 (six) hours as needed.  . busPIRone (BUSPAR) 15 MG tablet Take 15 mg by mouth 3 (three) times daily.  . Cholecalciferol (VITAMIN D) 50 MCG (2000 UT) tablet Take 2,000 Units by mouth daily.  Marland Kitchen LORazepam (ATIVAN) 0.5 MG tablet Take 1 tablet (0.5 mg total) by mouth 2 (two) times daily as needed for anxiety. X 14 days  . QUEtiapine (SEROQUEL) 25 MG tablet Take 1 tablet (25 mg total) by mouth 2 (two) times daily.  . sertraline (ZOLOFT) 100 MG tablet Take 100 mg by mouth daily.   No facility-administered encounter medications on file as of 12/10/2018.     Review of Systems  Unable to perform ROS: Dementia    Immunization History  Administered Date(s) Administered  . DTaP 11/30/2018  . Influenza, High Dose Seasonal PF 04/03/2017  . Influenza-Unspecified 08/06/2018  . PPD Test 10/27/2017  . Pneumococcal Conjugate-13 12/02/2014  . Pneumococcal Polysaccharide-23 05/27/2005  . Zoster Recombinat (Shingrix) 11/25/2018   Pertinent  Health Maintenance Due  Topic Date Due  . INFLUENZA VACCINE  12/26/2018  . DEXA SCAN  Completed  . PNA vac Low Risk Adult  Completed   Fall Risk  10/16/2018  Falls in the past year? 1  Number falls in past yr: 1  Injury with Fall? 1  Risk for fall due to : History of fall(s);Impaired mobility  Follow up Falls evaluation completed;Falls prevention discussed   Functional Status Survey:    Vitals:   12/10/18 1531  Weight: 114 lb (51.7 kg)  Height: 5\' 4"  (1.626 m)   Body mass index is 19.57 kg/m.  Wt Readings from Last 3 Encounters:  12/10/18 114 lb (51.7 kg)  11/16/18 123 lb 9.6 oz (56.1 kg)  10/20/18 123 lb (55.8 kg)    Physical Exam Vitals signs and nursing note reviewed.  Constitutional:      General: She is not in acute distress. HENT:     Head: Normocephalic and atraumatic.  Musculoskeletal:      Right lower leg: No edema.     Left lower leg: No edema.  Neurological:     Mental Status: She is alert.  Psychiatric:        Mood and Affect: Mood normal.     Labs reviewed: Recent Labs    05/31/18 0837  08/02/18 0524 08/03/18 0708 08/04/18 0720 10/20/18 0600  NA 138   < > 138 140 140 141  K 3.8   < > 3.4* 3.3* 3.5 4.4  CL 107   < > 110 109 107  --   CO2 23   < > 22 23 23   --   GLUCOSE 123*   < > 121* 104* 98  --   BUN 14   < >  11 13 11 12   CREATININE 0.66   < > 0.58 0.66 0.56 0.6  CALCIUM 8.4*   < > 8.0* 8.0* 8.3*  --   MG 1.7  --   --  1.7  --   --    < > = values in this interval not displayed.   Recent Labs    05/31/18 0837 07/30/18 2200 10/20/18 0600  AST 23 19 13   ALT 15 16 6*  ALKPHOS 84 94 125  BILITOT 0.6 0.8  --   PROT 5.9* 6.7  --   ALBUMIN 3.2* 3.7  --    Recent Labs    05/31/18 0837 06/01/18 0125 07/30/18 2200 08/01/18 0339 08/03/18 0708 08/04/18 0720 10/20/18 0600  WBC 12.1* 7.2 12.3* 8.6  --   --  6.8  NEUTROABS 10.9*  --  10.3*  --   --   --   --   HGB 11.6* 10.7* 10.8* 9.7* 8.3* 8.9* 11.2*  HCT 36.9 33.0* 35.8* 31.0* 26.2* 28.2* 34*  MCV 93.4 91.9 96.5 90.4  --   --   --   PLT 248 244 231 196  --   --  254   Lab Results  Component Value Date   TSH 3.16 10/20/2018   Lab Results  Component Value Date   HGBA1C 6.2 10/20/2018   Lab Results  Component Value Date   CHOL  12/26/2006    175        ATP III CLASSIFICATION:  <200     mg/dL   Desirable  200-239  mg/dL   Borderline High  >=240    mg/dL   High   HDL 45 12/26/2006   LDLCALC (H) 12/26/2006    109        Total Cholesterol/HDL:CHD Risk Coronary Heart Disease Risk Table                     Men   Women  1/2 Average Risk   3.4   3.3   TRIG 103 12/26/2006   CHOLHDL 3.9 12/26/2006    Significant Diagnostic Results in last 30 days:  No results found.  Assessment/Plan 1. Essential hypertension Controlled without medications.   2. Coronary artery disease involving  native coronary artery of native heart with angina pectoris ([place]) With hx of MI, not currently on aspirin therapy which she probably would not benefit from at this point.   3. Frontotemporal dementia ([place]) She has adjusted to skilled care and her behavior is much improved. She has severe disease and her goals of care are comfort based.   4. Anxiety Improved. Continue Buspar 15 mg tid. Continue Ativan for two more weeks and then discontinue if no longer needed   5. Age-related osteoporosis with current pathological fracture with routine healing, subsequent encounter Previous hip fracture now healed No longer ambulatory and due to her dementia she is not able to go out for dexa scans etc.     Family/ staff Communication: staff   Labs/tests ordered:  NA

## 2018-12-24 ENCOUNTER — Non-Acute Institutional Stay (SKILLED_NURSING_FACILITY): Payer: Medicare Other | Admitting: Adult Health

## 2018-12-24 DIAGNOSIS — G3109 Other frontotemporal dementia: Secondary | ICD-10-CM | POA: Diagnosis not present

## 2018-12-24 DIAGNOSIS — F0280 Dementia in other diseases classified elsewhere without behavioral disturbance: Secondary | ICD-10-CM

## 2018-12-25 NOTE — Progress Notes (Signed)
Location:  Occupational psychologist of Service:  SNF (31) Provider:   Cindi Carbon, ANP [place] [phone]   Gayland Curry, DO  Patient Care Team: Gayland Curry, DO as PCP - General (Geriatric Medicine) Royal Hawthorn, NP as Nurse Practitioner (Nurse Practitioner)  Extended Emergency Contact Information Primary Emergency Contact: Sharyne Richters Address: [address]          [place], [place] [address] Johnnette Litter of [place] Work Phone: [phone] Mobile Phone: [phone] Relation: Son Secondary Emergency Contact: Launa Flight [place] of [place] Phone: [phone] Relation: Son  Code Status:  DNR Goals of care: Advanced Directive information Advanced Directives 10/20/2018  Does Patient Have a Medical Advance Directive? -  Type of Paramedic of [place];Living will;Out of facility DNR (pink MOST or yellow form)  Does patient want to make changes to medical advance directive? No - Patient declined  Copy of [place] in Chart? Yes - validated most recent copy scanned in chart (See row information)  Pre-existing out of facility DNR order (yellow form or pink MOST form) Yellow form placed in chart (order not valid for inpatient use)     Chief Complaint  Patient presents with  . Acute Visit    request for ativan renewal    HPI:  Pt is a 85 y.o. female seen today for an acute visit for ativan renewal. She has a hx of frontotemporal dementia and resides in skilled care. During the evening and night time hours she can become anxious and agitated. She uses ativan typically once a day as needed for these behaviors. She is also known to talk to others that are not there and appears to see things that are not there.    Past Medical History:  Diagnosis Date  . Angina pectoris (HCC)    intrascapular, abnormal cardiolote preop TKR  . Anxiety   . Arthritis   . ASCVD (arteriosclerotic  cardiovascular disease)    S/P NSTEMI with LCX stent 2008, cath on 06/29/2008 revealed widely patent circumflex  . Bacterial infection 2016  . Coronary artery disease   . Depression   . Diabetes mellitus   . Frontotemporal dementia ([place]) 11/16/2018  . GERD (gastroesophageal reflux disease)   . Heart attack ([place]) 2010  . Hyperlipidemia   . Hypertension   . Hypothyroidism   . Myocardial infarction [facility]) [phone]   with stents  . Osteoporosis   . Rheumatoid arthritis ([place]) 2013  . Trigger finger 02/2013  . Vascular dementia ([place])   . Vertigo 2016  . Vitamin D deficiency    Past Surgical History:  Procedure Laterality Date  . ABDOMINAL HYSTERECTOMY  1969  . BREAST BIOPSY  2005   right -benign  . BREAST SURGERY  2004   right -cancerous-with radiation  . broken finger  2006  . CORONARY ANGIOPLASTY  2008, 2010  . foot ampuation  12/2009   right  . HAMMER TOE SURGERY  06/2009   right  . HERNIA REPAIR  [id number]  . INTRAMEDULLARY (IM) NAIL INTERTROCHANTERIC Left 07/31/2018   Procedure: INTRAMEDULLARY (IM) NAIL INTERTROCHANTRIC;  Surgeon: Shona Needles, MD;  Location: [place];  Service: Orthopedics;  Laterality: Left;  . KNEE ARTHROSCOPY  1980 ,1994   bilateral  . TOTAL GASTRECTOMY  1983   with vagotomy  . TOTAL KNEE ARTHROPLASTY  02/26/2012   Procedure: TOTAL KNEE ARTHROPLASTY;  Surgeon: Tobi Bastos, MD;  Location: [facility];  Service: Orthopedics;  Laterality: Right;    No Known Allergies  Outpatient Encounter Medications as of 12/24/2018  Medication Sig  . acetaminophen (TYLENOL) 325 MG tablet Take 650 mg by mouth every 6 (six) hours as needed.  . busPIRone (BUSPAR) 15 MG tablet Take 15 mg by mouth 3 (three) times daily.  . Cholecalciferol (VITAMIN D) 50 MCG (2000 UT) tablet Take 2,000 Units by mouth daily.  Marland Kitchen. LORazepam (ATIVAN) 0.5 MG tablet Take 1 tablet (0.5 mg total) by mouth 2 (two) times daily as needed for anxiety. X 14 days  . QUEtiapine (SEROQUEL) 25 MG tablet Take  1 tablet (25 mg total) by mouth 2 (two) times daily.  . sertraline (ZOLOFT) 100 MG tablet Take 100 mg by mouth daily.   No facility-administered encounter medications on file as of 12/24/2018.     Review of Systems  Unable to perform ROS: Dementia    Immunization History  Administered Date(s) Administered  . DTaP 11/30/2018  . Influenza, High Dose Seasonal PF 04/03/2017  . Influenza-Unspecified 08/06/2018  . PPD Test 10/27/2017  . Pneumococcal Conjugate-13 12/02/2014  . Pneumococcal Polysaccharide-23 05/27/2005  . Zoster Recombinat (Shingrix) 11/25/2018   Pertinent  Health Maintenance Due  Topic Date Due  . INFLUENZA VACCINE  12/26/2018  . DEXA SCAN  Completed  . PNA vac Low Risk Adult  Completed   Fall Risk  10/16/2018  Falls in the past year? 1  Number falls in past yr: 1  Injury with Fall? 1  Risk for fall due to : History of fall(s);Impaired mobility  Follow up Falls evaluation completed;Falls prevention discussed   Functional Status Survey:    Vitals:   12/25/18 1239  Temp: 97.7 F (36.5 C)   There is no height or weight on file to calculate BMI. Physical Exam Constitutional:      Appearance: Normal appearance.  Musculoskeletal:     Right lower leg: No edema.     Left lower leg: No edema.  Neurological:     Mental Status: She is alert. Mental status is at baseline.  Psychiatric:     Comments: Talking excessively but does not appear anxious     Labs reviewed: Recent Labs    05/31/18 0837  08/02/18 0524 08/03/18 0708 08/04/18 0720 10/20/18 0600  NA 138   < > 138 140 140 141  K 3.8   < > 3.4* 3.3* 3.5 4.4  CL 107   < > 110 109 107  --   CO2 23   < > 22 23 23   --   GLUCOSE 123*   < > 121* 104* 98  --   BUN 14   < > 11 13 11 12   CREATININE 0.66   < > 0.58 0.66 0.56 0.6  CALCIUM 8.4*   < > 8.0* 8.0* 8.3*  --   MG 1.7  --   --  1.7  --   --    < > = values in this interval not displayed.   Recent Labs    05/31/18 0837 07/30/18 2200 10/20/18  0600  AST 23 19 13   ALT 15 16 6*  ALKPHOS 84 94 125  BILITOT 0.6 0.8  --   PROT 5.9* 6.7  --   ALBUMIN 3.2* 3.7  --    Recent Labs    05/31/18 0837 06/01/18 0125 07/30/18 2200 08/01/18 0339 08/03/18 0708 08/04/18 0720 10/20/18 0600  WBC 12.1* 7.2 12.3* 8.6  --   --  6.8  NEUTROABS 10.9*  --  10.3*  --   --   --   --   HGB 11.6* 10.7* 10.8* 9.7* 8.3* 8.9* 11.2*  HCT 36.9 33.0* 35.8* 31.0* 26.2* 28.2* 34*  MCV 93.4 91.9 96.5 90.4  --   --   --   PLT 248 244 231 196  --   --  254   Lab Results  Component Value Date   TSH 3.16 10/20/2018   Lab Results  Component Value Date   HGBA1C 6.2 10/20/2018   Lab Results  Component Value Date   CHOL  12/26/2006    175        ATP III CLASSIFICATION:  <200     mg/dL   Desirable  051-102  mg/dL   Borderline High  >=111    mg/dL   High   HDL 45 73/56/7014   LDLCALC (H) 12/26/2006    109        Total Cholesterol/HDL:CHD Risk Coronary Heart Disease Risk Table                     Men   Women  1/2 Average Risk   3.4   3.3   TRIG 103 12/26/2006   CHOLHDL 3.9 12/26/2006    Significant Diagnostic Results in last 30 days:  No results found.  Assessment/Plan 1. Frontotemporal dementia (HCC) She continues to have periods of agitation and anxiety, typically in the evening hours. Will continue ativan 0.5 mg bid as needed, consider dose increase in Seroquel at next visit    Family/ staff Communication: nurse   Labs/tests ordered:  NA

## 2019-01-19 ENCOUNTER — Non-Acute Institutional Stay (SKILLED_NURSING_FACILITY): Payer: Medicare Other | Admitting: Internal Medicine

## 2019-01-19 ENCOUNTER — Encounter: Payer: Self-pay | Admitting: Internal Medicine

## 2019-01-19 DIAGNOSIS — F419 Anxiety disorder, unspecified: Secondary | ICD-10-CM

## 2019-01-19 DIAGNOSIS — I25119 Atherosclerotic heart disease of native coronary artery with unspecified angina pectoris: Secondary | ICD-10-CM | POA: Diagnosis not present

## 2019-01-19 DIAGNOSIS — M8000XS Age-related osteoporosis with current pathological fracture, unspecified site, sequela: Secondary | ICD-10-CM

## 2019-01-19 DIAGNOSIS — F0281 Dementia in other diseases classified elsewhere with behavioral disturbance: Secondary | ICD-10-CM

## 2019-01-19 DIAGNOSIS — G3109 Other frontotemporal dementia: Secondary | ICD-10-CM

## 2019-01-19 DIAGNOSIS — I10 Essential (primary) hypertension: Secondary | ICD-10-CM

## 2019-01-19 DIAGNOSIS — F02818 Dementia in other diseases classified elsewhere, unspecified severity, with other behavioral disturbance: Secondary | ICD-10-CM

## 2019-01-19 DIAGNOSIS — K5909 Other constipation: Secondary | ICD-10-CM

## 2019-01-19 NOTE — Progress Notes (Signed)
Patient ID: Michelle [name], female   DOB: 05-Jan-1928, 85 y.o.   MRN: [id number]  Location:  [facility] Room Number: 106 Place of Service:  SNF ([phone]) Provider:   Kermit Baloeed, Tarell Schollmeyer L, DO  Patient Care Team: Kermit Baloeed, Eliani Leclere L, DO as PCP - General (Geriatric Medicine) Fletcher AnonWert, Christina, NP as Nurse Practitioner (Nurse Practitioner)  Extended Emergency Contact Information Primary Emergency Contact: Blake DivineForde,Francis Jr Address: [address]          [place], [place] [address] [place] Work Phone: [phone] Mobile Phone: [phone] Relation: Son Secondary Emergency Contact: Charleston PootForde,Terence  [place] Home Phone: [phone] Relation: Son  Code Status:  DNR, needs MOST Goals of care: Advanced Directive information Advanced Directives 10/20/2018  Does Patient Have a Medical Advance Directive? -  Type of Estate agentAdvance Directive Healthcare Power of [place];Living will;Out of facility DNR (pink MOST or yellow form)  Does patient want to make changes to medical advance directive? No - Patient declined  Copy of Healthcare Power of Attorney in Chart? Yes - validated most recent copy scanned in chart (See row information)  Pre-existing out of facility DNR order (yellow form or pink MOST form) Yellow form placed in chart (order not valid for inpatient use)     Chief Complaint  Patient presents with  . Medical Management of Chronic Issues    Routine Visit    HPI:  Pt is a 85 y.o. female seen today for medical management of chronic diseases.  Michelle [name] is in SNF for long-term care due to her dementia--she's had frontotemporal dementia (CT with atrophy and microvascular ischemic change c/w vascular, but previously diagnosed with FTD--too advanced to tell now).  She did not do well in memory care.  She is typically found sitting at the nurses' station and will converse with anyone who comes by, typically pleasantly.  Sometimes, she gets upset if she wants  to be taken a certain direction when staff push her in her reclined wheelchair.  She has times when she gets very agitated when taking her medications.  That seemed to be worse in the evenings.  At times, she awakens at night and talks loudly in her room per notes.  She is on seroquel for her hallucinations/delusions and has ativan when she gets anxious and upset or physically combative with staff.  She's also on regular buspar for anxiety.  She has prn tylenol for pain--has had OA of her right knee and a left hip fracture back in March prior to [facility] and transfer here for long-term care.    She has had some difficulty with constipation.  She's on daily miralax and senokot-s every other day for this.  These recently got added due to requiring manual disimpaction on one occasion noted in record.  She also was having hard stools.    Her diet was changed from finger foods to foods to be eaten with utensils.  She's required staff to assist her with eating.  She takes boost supplements.  Weight had dropped 9 lbs in July, but has trended back up to 123 lbs this month.  Past Medical History:  Diagnosis Date  . Angina pectoris (HCC)    intrascapular, abnormal cardiolote preop TKR  . Anxiety   . Arthritis   . ASCVD (arteriosclerotic cardiovascular disease)    S/P NSTEMI with LCX stent 2008, cath on 06/29/2008 revealed widely patent circumflex  . Bacterial infection 2016  . Coronary artery disease   .  Depression   . Diabetes mellitus   . Frontotemporal dementia (HCC) 11/16/2018  . GERD (gastroesophageal reflux disease)   . Heart attack (HCC) 2010  . Hyperlipidemia   . Hypertension   . Hypothyroidism   . Myocardial infarction [facility]) [id number]   with stents  . Osteoporosis   . Rheumatoid arthritis (HCC) 2013  . Trigger finger 02/2013  . Vascular dementia (HCC)   . Vertigo 2016  . Vitamin D deficiency    Past Surgical History:  Procedure Laterality Date  . ABDOMINAL HYSTERECTOMY   1969  . BREAST BIOPSY  2005   right -benign  . BREAST SURGERY  2004   right -cancerous-with radiation  . broken finger  2006  . CORONARY ANGIOPLASTY  2008, 2010  . foot ampuation  12/2009   right  . HAMMER TOE SURGERY  06/2009   right  . HERNIA REPAIR  [id number]  . INTRAMEDULLARY (IM) NAIL INTERTROCHANTERIC Left 07/31/2018   Procedure: INTRAMEDULLARY (IM) NAIL INTERTROCHANTRIC;  Surgeon: Roby Lofts, MD;  Location: [facility] OR;  Service: Orthopedics;  Laterality: Left;  . KNEE ARTHROSCOPY  1980 ,1994   bilateral  . TOTAL GASTRECTOMY  1983   with vagotomy  . TOTAL KNEE ARTHROPLASTY  02/26/2012   Procedure: TOTAL KNEE ARTHROPLASTY;  Surgeon: Jacki Cones, MD;  Location: [facility] ORS;  Service: Orthopedics;  Laterality: Right;    No Known Allergies  Outpatient Encounter Medications as of 01/19/2019  Medication Sig  . acetaminophen (TYLENOL) 325 MG tablet Take 650 mg by mouth every 6 (six) hours as needed.  . busPIRone (BUSPAR) 15 MG tablet Take 15 mg by mouth 3 (three) times daily.  . Cholecalciferol (VITAMIN D) 50 MCG (2000 UT) tablet Take 2,000 Units by mouth daily.  Marland Kitchen LORazepam (ATIVAN) 0.5 MG tablet Take 1 tablet (0.5 mg total) by mouth 2 (two) times daily as needed for anxiety. X 14 days  . polyethylene glycol (MIRALAX / GLYCOLAX) 17 g packet Take 17 g by mouth daily.  . QUEtiapine (SEROQUEL) 25 MG tablet Take 1 tablet (25 mg total) by mouth 2 (two) times daily.  . sennosides-docusate sodium (SENOKOT-S) 8.6-50 MG tablet Take 1 tablet by mouth every other day.  . sertraline (ZOLOFT) 100 MG tablet Take 100 mg by mouth daily.   No facility-administered encounter medications on file as of 01/19/2019.     Review of Systems  Constitutional: Negative for activity change, appetite change, chills, fatigue and fever.  HENT: Negative for congestion.   Eyes: Negative for visual disturbance.  Respiratory: Negative for cough and shortness of breath.   Gastrointestinal: Positive for  constipation. Negative for abdominal pain, blood in stool, diarrhea, nausea and vomiting.  Genitourinary: Negative for dysuria.  Musculoskeletal: Positive for arthralgias and gait problem.  Skin: Negative for color change.  Neurological: Negative for dizziness and weakness.  Hematological: Bruises/bleeds easily.  Psychiatric/Behavioral: Positive for agitation, behavioral problems, confusion, hallucinations and sleep disturbance. The patient is nervous/anxious.     Immunization History  Administered Date(s) Administered  . DTaP 11/30/2018  . Influenza, High Dose Seasonal PF 04/03/2017  . Influenza-Unspecified 08/06/2018  . PPD Test 10/27/2017  . Pneumococcal Conjugate-13 12/02/2014  . Pneumococcal Polysaccharide-23 05/27/2005  . Tdap 11/26/2018  . Zoster Recombinat (Shingrix) 11/24/2018   Pertinent  Health Maintenance Due  Topic Date Due  . INFLUENZA VACCINE  12/26/2018  . DEXA SCAN  Completed  . PNA vac Low Risk Adult  Completed   Fall Risk  10/16/2018  Falls in the past year?  1  Number falls in past yr: 1  Injury with Fall? 1  Risk for fall due to : History of fall(s);Impaired mobility  Follow up Falls evaluation completed;Falls prevention discussed   Functional Status Survey:    Vitals:   01/19/19 1420  BP: 115/71  Pulse: (!) 55  Resp: 20  Temp: 97.8 F (36.6 C)  TempSrc: Oral  SpO2: 98%  Weight: 123 lb (55.8 kg)  Height: 5\' 4"  (1.626 m)   Body mass index is 21.11 kg/m. Physical Exam Vitals signs and nursing note reviewed.  Constitutional:      Appearance: Normal appearance.  HENT:     Head: Normocephalic and atraumatic.  Eyes:     Extraocular Movements: Extraocular movements intact.     Conjunctiva/sclera: Conjunctivae normal.     Pupils: Pupils are equal, round, and reactive to light.  Cardiovascular:     Rate and Rhythm: Normal rate and regular rhythm.     Pulses: Normal pulses.     Heart sounds: Normal heart sounds.  Pulmonary:     Effort:  Pulmonary effort is normal.     Breath sounds: Normal breath sounds. No wheezing, rhonchi or rales.  Abdominal:     General: Bowel sounds are normal. There is no distension.     Palpations: Abdomen is soft.     Tenderness: There is no abdominal tenderness.  Musculoskeletal: Normal range of motion.     Right lower leg: No edema.     Left lower leg: No edema.  Skin:    General: Skin is warm and dry.  Neurological:     General: No focal deficit present.     Mental Status: She is alert. Mental status is at baseline.     Motor: Weakness present.     Gait: Gait abnormal.     Comments: Uses reclined wheelchair and hoyer lift  Psychiatric:     Comments: Pleasant, smiling, cheerful during time I visited      Labs reviewed: Recent Labs    05/31/18 0837  08/02/18 0524 08/03/18 0708 08/04/18 0720 10/20/18 0600  NA 138   < > 138 140 140 141  K 3.8   < > 3.4* 3.3* 3.5 4.4  CL 107   < > 110 109 107  --   CO2 23   < > 22 23 23   --   GLUCOSE 123*   < > 121* 104* 98  --   BUN 14   < > 11 13 11 12   CREATININE 0.66   < > 0.58 0.66 0.56 0.6  CALCIUM 8.4*   < > 8.0* 8.0* 8.3*  --   MG 1.7  --   --  1.7  --   --    < > = values in this interval not displayed.   Recent Labs    05/31/18 0837 07/30/18 2200 10/20/18 0600  AST 23 19 13   ALT 15 16 6*  ALKPHOS 84 94 125  BILITOT 0.6 0.8  --   PROT 5.9* 6.7  --   ALBUMIN 3.2* 3.7  --    Recent Labs    05/31/18 0837 06/01/18 0125 07/30/18 2200 08/01/18 0339 08/03/18 0708 08/04/18 0720 10/20/18 0600  WBC 12.1* 7.2 12.3* 8.6  --   --  6.8  NEUTROABS 10.9*  --  10.3*  --   --   --   --   HGB 11.6* 10.7* 10.8* 9.7* 8.3* 8.9* 11.2*  HCT 36.9 33.0* 35.8* 31.0* 26.2* 28.2* 34*  MCV 93.4 91.9 96.5 90.4  --   --   --   PLT 248 244 231 196  --   --  254   Lab Results  Component Value Date   TSH 3.16 10/20/2018   Lab Results  Component Value Date   HGBA1C 6.2 10/20/2018   Lab Results  Component Value Date   CHOL  12/26/2006     175        ATP III CLASSIFICATION:  <200     mg/dL   Desirable  200-239  mg/dL   Borderline High  >=240    mg/dL   High   HDL 45 12/26/2006   LDLCALC (H) 12/26/2006    109        Total Cholesterol/HDL:CHD Risk Coronary Heart Disease Risk Table                     Men   Women  1/2 Average Risk   3.4   3.3   TRIG 103 12/26/2006   CHOLHDL 3.9 12/26/2006    Assessment/Plan 1. Frontotemporal dementia with behavioral disturbance ([place]) -advanced stages, word salad-like speech, smiles and socializes as best she can with passersby, but agitated later in day/evening and sometimes overnight, anxious and upset and it sounds like hallucinates at times overnight--seroquel was adjusted recently to help with this as resident was getting upset about it -has tylenol to be tried in case of pain with her prior hip fx and knee OA  2. Essential hypertension -bps run on low end, not on meds for this any longer  3. Coronary artery disease involving native coronary artery of native heart with angina pectoris ([place]) -no longer on secondary preventive medications due to pill burden and advanced dementia--needs MOST done; does have DNR  4. Age-related osteoporosis with current pathological fracture, sequela -not on treatment due to advanced dementia and nonambulatory (safely on own) though tries to get up out of chair -also struggles with the medications she does get  5. Anxiety -continues on regular buspar and prn ativan for breakthrough anxiety  6.  Chronic constipation -encourage hydration to help with this -cont miralax and senokot-s regimen -monitor bms carefully -regular food choices in divided plate and help with meals, special sports bottle to help with hydration that family brought for her  Family/ staff Communication: discussed with snf nurse and reviewed notes  Labs/tests ordered:  No new  Buryl Bamber L. Miia Blanks, D.O. [facility] [address], [place] [address] Cell Phone (Mon-Fri 8am-5pm):  [phone] On Call:  [phone] & follow prompts after 5pm & weekends Office Phone:  [phone] Office Fax:  [phone]

## 2019-02-08 ENCOUNTER — Encounter: Payer: Self-pay | Admitting: Adult Health

## 2019-02-08 ENCOUNTER — Non-Acute Institutional Stay (SKILLED_NURSING_FACILITY): Payer: Medicare Other | Admitting: Adult Health

## 2019-02-08 DIAGNOSIS — B850 Pediculosis due to Pediculus humanus capitis: Secondary | ICD-10-CM | POA: Diagnosis not present

## 2019-02-08 NOTE — Progress Notes (Signed)
Location:  Occupational psychologist of Service:  SNF (31) Provider:   Cindi Carbon, ANP [place] [phone]   Gayland Curry, DO  Patient Care Team: Gayland Curry, DO as PCP - General (Geriatric Medicine) Royal Hawthorn, NP as Nurse Practitioner (Nurse Practitioner)  Extended Emergency Contact Information Primary Emergency Contact: Sharyne Richters Address: [address]          [place], [place] [address] Johnnette Litter of [place] Work Phone: [phone] Mobile Phone: [phone] Relation: Son Secondary Emergency Contact: Launa Flight [place] Phone: [phone] Relation: Son  Code Status:  DNR Goals of care: Advanced Directive information Advanced Directives 10/20/2018  Does Patient Have a Medical Advance Directive? -  Type of Paramedic of [place];Living will;Out of facility DNR (pink MOST or yellow form)  Does patient want to make changes to medical advance directive? No - Patient declined  Copy of [place] in Chart? Yes - validated most recent copy scanned in chart (See row information)  Pre-existing out of facility DNR order (yellow form or pink MOST form) Yellow form placed in chart (order not valid for inpatient use)     Chief Complaint  Patient presents with  . Acute Visit    scalp itching    HPI:  Pt is a 85 y.o. female seen today for an acute visit for scalp itching. The resident had dementia and has been scratching her scalp. Nurse supervisor reports nits in her hair that are numerous.    Past Medical History:  Diagnosis Date  . Angina pectoris (HCC)    intrascapular, abnormal cardiolote preop TKR  . Anxiety   . Arthritis   . ASCVD (arteriosclerotic cardiovascular disease)    S/P NSTEMI with LCX stent 2008, cath on 06/29/2008 revealed widely patent circumflex  . Bacterial infection 2016  . Coronary artery disease   . Depression   . Diabetes mellitus    . Frontotemporal dementia ([place]) 11/16/2018  . GERD (gastroesophageal reflux disease)   . Heart attack ([place]) 2010  . Hyperlipidemia   . Hypertension   . Hypothyroidism   . Myocardial infarction [facility]) [id number]   with stents  . Osteoporosis   . Rheumatoid arthritis ([place]) 2013  . Trigger finger 02/2013  . Vascular dementia ([place])   . Vertigo 2016  . Vitamin D deficiency    Past Surgical History:  Procedure Laterality Date  . ABDOMINAL HYSTERECTOMY  1969  . BREAST BIOPSY  2005   right -benign  . BREAST SURGERY  2004   right -cancerous-with radiation  . broken finger  2006  . CORONARY ANGIOPLASTY  2008, 2010  . foot ampuation  12/2009   right  . HAMMER TOE SURGERY  06/2009   right  . HERNIA REPAIR  [id number]  . INTRAMEDULLARY (IM) NAIL INTERTROCHANTERIC Left 07/31/2018   Procedure: INTRAMEDULLARY (IM) NAIL INTERTROCHANTRIC;  Surgeon: Shona Needles, MD;  Location: [facility];  Service: Orthopedics;  Laterality: Left;  . KNEE ARTHROSCOPY  1980 ,1994   bilateral  . TOTAL GASTRECTOMY  1983   with vagotomy  . TOTAL KNEE ARTHROPLASTY  02/26/2012   Procedure: TOTAL KNEE ARTHROPLASTY;  Surgeon: Tobi Bastos, MD;  Location: [facility];  Service: Orthopedics;  Laterality: Right;    No Known Allergies  Outpatient Encounter Medications as of 02/08/2019  Medication Sig  . acetaminophen (TYLENOL) 325 MG tablet Take 650 mg by mouth every 6 (six) hours as needed.  . busPIRone (BUSPAR)  15 MG tablet Take 15 mg by mouth 3 (three) times daily.  . Cholecalciferol (VITAMIN D) 50 MCG (2000 UT) tablet Take 2,000 Units by mouth daily.  Marland Kitchen. LORazepam (ATIVAN) 0.5 MG tablet Take 1 tablet (0.5 mg total) by mouth 2 (two) times daily as needed for anxiety. X 14 days  . polyethylene glycol (MIRALAX / GLYCOLAX) 17 g packet Take 17 g by mouth daily.  . QUEtiapine (SEROQUEL) 25 MG tablet Take 1 tablet (25 mg total) by mouth 2 (two) times daily.  . sennosides-docusate sodium (SENOKOT-S) 8.6-50 MG tablet Take 1 tablet  by mouth every other day.  . sertraline (ZOLOFT) 100 MG tablet Take 100 mg by mouth daily.   No facility-administered encounter medications on file as of 02/08/2019.     Review of Systems  Unable to perform ROS: Dementia    Immunization History  Administered Date(s) Administered  . DTaP 11/30/2018  . Influenza, High Dose Seasonal PF 04/03/2017  . Influenza-Unspecified 08/06/2018  . PPD Test 10/27/2017  . Pneumococcal Conjugate-13 12/02/2014  . Pneumococcal Polysaccharide-23 05/27/2005  . Tdap 11/26/2018  . Zoster Recombinat (Shingrix) 11/24/2018   Pertinent  Health Maintenance Due  Topic Date Due  . INFLUENZA VACCINE  12/26/2018  . DEXA SCAN  Completed  . PNA vac Low Risk Adult  Completed   Fall Risk  10/16/2018  Falls in the past year? 1  Number falls in past yr: 1  Injury with Fall? 1  Risk for fall due to : History of fall(s);Impaired mobility  Follow up Falls evaluation completed;Falls prevention discussed   Functional Status Survey:    There were no vitals filed for this visit. There is no height or weight on file to calculate BMI. Physical Exam Vitals signs and nursing note reviewed.  Constitutional:      Appearance: Normal appearance.  Skin:    General: Skin is warm and dry.     Comments: Scalp is clear, nits removed by nurse.   Neurological:     Mental Status: She is alert.     Labs reviewed: Recent Labs    05/31/18 0837  08/02/18 0524 08/03/18 0708 08/04/18 0720 10/20/18 0600  NA 138   < > 138 140 140 141  K 3.8   < > 3.4* 3.3* 3.5 4.4  CL 107   < > 110 109 107  --   CO2 23   < > 22 23 23   --   GLUCOSE 123*   < > 121* 104* 98  --   BUN 14   < > 11 13 11 12   CREATININE 0.66   < > 0.58 0.66 0.56 0.6  CALCIUM 8.4*   < > 8.0* 8.0* 8.3*  --   MG 1.7  --   --  1.7  --   --    < > = values in this interval not displayed.   Recent Labs    05/31/18 0837 07/30/18 2200 10/20/18 0600  AST 23 19 13   ALT 15 16 6*  ALKPHOS 84 94 125  BILITOT 0.6  0.8  --   PROT 5.9* 6.7  --   ALBUMIN 3.2* 3.7  --    Recent Labs    05/31/18 0837 06/01/18 0125 07/30/18 2200 08/01/18 0339 08/03/18 0708 08/04/18 0720 10/20/18 0600  WBC 12.1* 7.2 12.3* 8.6  --   --  6.8  NEUTROABS 10.9*  --  10.3*  --   --   --   --   HGB 11.6*  10.7* 10.8* 9.7* 8.3* 8.9* 11.2*  HCT 36.9 33.0* 35.8* 31.0* 26.2* 28.2* 34*  MCV 93.4 91.9 96.5 90.4  --   --   --   PLT 248 244 231 196  --   --  254   Lab Results  Component Value Date   TSH 3.16 10/20/2018   Lab Results  Component Value Date   HGBA1C 6.2 10/20/2018   Lab Results  Component Value Date   CHOL  12/26/2006    175        ATP III CLASSIFICATION:  <200     mg/dL   Desirable  465-035  mg/dL   Borderline High  >=465    mg/dL   High   HDL 45 68/04/7516   LDLCALC (H) 12/26/2006    109        Total Cholesterol/HDL:CHD Risk Coronary Heart Disease Risk Table                     Men   Women  1/2 Average Risk   3.4   3.3   TRIG 103 12/26/2006   CHOLHDL 3.9 12/26/2006    Significant Diagnostic Results in last 30 days:  No results found.  Assessment/Plan 1. Pediculosis capitis Nix (permethrin) scalp treatment today and then again in 7 days. Remove nits with a comb today and again with next treatment. Clean sheets, clothes, and surfaces today and again with next application    Family/ staff Communication: staff  Labs/tests ordered:  NA

## 2019-02-18 ENCOUNTER — Non-Acute Institutional Stay (SKILLED_NURSING_FACILITY): Payer: Medicare Other | Admitting: Adult Health

## 2019-02-18 ENCOUNTER — Encounter: Payer: Self-pay | Admitting: Adult Health

## 2019-02-18 DIAGNOSIS — F329 Major depressive disorder, single episode, unspecified: Secondary | ICD-10-CM | POA: Diagnosis not present

## 2019-02-18 DIAGNOSIS — Z903 Acquired absence of stomach [part of]: Secondary | ICD-10-CM

## 2019-02-18 DIAGNOSIS — F0280 Dementia in other diseases classified elsewhere without behavioral disturbance: Secondary | ICD-10-CM

## 2019-02-18 DIAGNOSIS — I10 Essential (primary) hypertension: Secondary | ICD-10-CM | POA: Diagnosis not present

## 2019-02-18 DIAGNOSIS — D62 Acute posthemorrhagic anemia: Secondary | ICD-10-CM

## 2019-02-18 DIAGNOSIS — K5901 Slow transit constipation: Secondary | ICD-10-CM

## 2019-02-18 DIAGNOSIS — F32A Depression, unspecified: Secondary | ICD-10-CM

## 2019-02-18 DIAGNOSIS — G3109 Other frontotemporal dementia: Secondary | ICD-10-CM | POA: Diagnosis not present

## 2019-02-18 DIAGNOSIS — K5900 Constipation, unspecified: Secondary | ICD-10-CM | POA: Insufficient documentation

## 2019-02-18 NOTE — Progress Notes (Signed)
Location:  Medical illustrator of Service:  SNF (31) Provider:   Peggye Ley, ANP [facility] [phone]   Kermit Balo, DO  Patient Care Team: Kermit Balo, DO as PCP - General (Geriatric Medicine) Fletcher Anon, NP as Nurse Practitioner (Nurse Practitioner)  Extended Emergency Contact Information Primary Emergency Contact: Blake Divine Address: [address]          [place], [place] [address] [place] of [place] Work Phone: [phone] Mobile Phone: [phone] Relation: Son Secondary Emergency Contact: Charleston Poot [place] of [place] Home Phone: [phone] Relation: Son  Code Status:  DNR Goals of care: Advanced Directive information Advanced Directives 10/20/2018  Does Patient Have a Medical Advance Directive? -  Type of Estate agent of [place];Living will;Out of facility DNR (pink MOST or yellow form)  Does patient want to make changes to medical advance directive? No - Patient declined  Copy of Healthcare Power of Attorney in Chart? Yes - validated most recent copy scanned in chart (See row information)  Pre-existing out of facility DNR order (yellow form or pink MOST form) Yellow form placed in chart (order not valid for inpatient use)     Chief Complaint  Patient presents with  . Medical Management of Chronic Issues    HPI:  Pt is a 85 y.o. female seen today for medical management of chronic diseases.    The nurse reported that on 9/14 that she was having increased delusions and agitated. She would talk excessively, get tearful, talk to people that were not there, and appeared unhappy. Seroquel was increased to 50 mg bid and that seems to have helped. For my visit she is sitting at the nsg station tearful, talks about her dad and appears upset. Most of her words not decipherable.  The nurse on duty today states that generally her behavior has been improved up until my visit this  afternoon.   Her appetite is variable and weight has gone up and down Wt Readings from Last 3 Encounters:  02/18/19 119 lb 6.4 oz (54.2 kg)  01/19/19 123 lb (55.8 kg)  12/10/18 114 lb (51.7 kg)   She has a hx of total gastrectomy with vagotomy Lab Results  Component Value Date   VITAMINB12 559 10/20/2018     She has been treated for last 3 times and finally has resolutions of symptoms.    Past Medical History:  Diagnosis Date  . Angina pectoris (HCC)    intrascapular, abnormal cardiolote preop TKR  . Anxiety   . Arthritis   . ASCVD (arteriosclerotic cardiovascular disease)    S/P NSTEMI with LCX stent 2008, cath on 06/29/2008 revealed widely patent circumflex  . Bacterial infection 2016  . Coronary artery disease   . Depression   . Diabetes mellitus   . Frontotemporal dementia (HCC) 11/16/2018  . GERD (gastroesophageal reflux disease)   . Heart attack (HCC) 2010  . Hyperlipidemia   . Hypertension   . Hypothyroidism   . Myocardial infarction [facility]) 3893,7342   with stents  . Osteoporosis   . Rheumatoid arthritis (HCC) 2013  . Trigger finger 02/2013  . Vascular dementia (HCC)   . Vertigo 2016  . Vitamin D deficiency    Past Surgical History:  Procedure Laterality Date  . ABDOMINAL HYSTERECTOMY  1969  . BREAST BIOPSY  2005   right -benign  . BREAST SURGERY  2004   right -cancerous-with radiation  . broken finger  2006  . CORONARY ANGIOPLASTY  2008, 2010  . foot ampuation  12/2009   right  . HAMMER TOE SURGERY  06/2009   right  . HERNIA REPAIR  [id number],2001  . INTRAMEDULLARY (IM) NAIL INTERTROCHANTERIC Left 07/31/2018   Procedure: INTRAMEDULLARY (IM) NAIL INTERTROCHANTRIC;  Surgeon: Roby LoftsHaddix, Kevin P, MD;  Location: [facility] OR;  Service: Orthopedics;  Laterality: Left;  . KNEE ARTHROSCOPY  1980 ,1994   bilateral  . TOTAL GASTRECTOMY  1983   with vagotomy  . TOTAL KNEE ARTHROPLASTY  02/26/2012   Procedure: TOTAL KNEE ARTHROPLASTY;  Surgeon: Jacki Conesonald A Gioffre, MD;  Location:  [facility] ORS;  Service: Orthopedics;  Laterality: Right;    No Known Allergies  Outpatient Encounter Medications as of 02/18/2019  Medication Sig  . QUEtiapine (SEROQUEL) 50 MG tablet Take 50 mg by mouth 2 (two) times daily.  Marland Kitchen. acetaminophen (TYLENOL) 325 MG tablet Take 650 mg by mouth every 6 (six) hours as needed.  . busPIRone (BUSPAR) 15 MG tablet Take 15 mg by mouth 3 (three) times daily.  . Cholecalciferol (VITAMIN D) 50 MCG (2000 UT) tablet Take 2,000 Units by mouth daily.  Marland Kitchen. LORazepam (ATIVAN) 0.5 MG tablet Take 1 tablet (0.5 mg total) by mouth 2 (two) times daily as needed for anxiety. X 14 days  . polyethylene glycol (MIRALAX / GLYCOLAX) 17 g packet Take 17 g by mouth daily.  . sennosides-docusate sodium (SENOKOT-S) 8.6-50 MG tablet Take 1 tablet by mouth every other day.  . sertraline (ZOLOFT) 100 MG tablet Take 100 mg by mouth daily.  . [DISCONTINUED] QUEtiapine (SEROQUEL) 25 MG tablet Take 1 tablet (25 mg total) by mouth 2 (two) times daily.   No facility-administered encounter medications on file as of 02/18/2019.     Review of Systems  Unable to perform ROS: Dementia    Immunization History  Administered Date(s) Administered  . DTaP 11/30/2018  . Influenza, High Dose Seasonal PF 04/03/2017  . Influenza-Unspecified 08/06/2018  . PPD Test 10/27/2017  . Pneumococcal Conjugate-13 12/02/2014  . Pneumococcal Polysaccharide-23 05/27/2005  . Tdap 11/26/2018  . Zoster Recombinat (Shingrix) 11/24/2018   Pertinent  Health Maintenance Due  Topic Date Due  . INFLUENZA VACCINE  12/26/2018  . DEXA SCAN  Completed  . PNA vac Low Risk Adult  Completed   Fall Risk  10/16/2018  Falls in the past year? 1  Number falls in past yr: 1  Injury with Fall? 1  Risk for fall due to : History of fall(s);Impaired mobility  Follow up Falls evaluation completed;Falls prevention discussed   Functional Status Survey:    Vitals:   02/18/19 1443  Weight: 119 lb 6.4 oz (54.2 kg)   Body mass  index is 20.49 kg/m. Physical Exam Vitals signs and nursing note reviewed.  Constitutional:      General: She is in acute distress (tearful and agitated).     Appearance: She is not diaphoretic.  HENT:     Head: Normocephalic and atraumatic.     Nose: Nose normal. No congestion.  Neck:     Vascular: No JVD.  Cardiovascular:     Rate and Rhythm: Normal rate and regular rhythm.     Heart sounds: No murmur.  Pulmonary:     Effort: Pulmonary effort is normal. No respiratory distress.     Breath sounds: Normal breath sounds. No wheezing.  Abdominal:     General: Bowel sounds are normal. There is no distension.     Palpations: Abdomen is soft.  Skin:    General: Skin is  warm and dry.  Neurological:     General: No focal deficit present.     Mental Status: She is alert. Mental status is at baseline.  Psychiatric:        Mood and Affect: Mood normal.     Labs reviewed: Recent Labs    05/31/18 0837  08/02/18 0524 08/03/18 0708 08/04/18 0720 10/20/18 0600  NA 138   < > 138 140 140 141  K 3.8   < > 3.4* 3.3* 3.5 4.4  CL 107   < > 110 109 107  --   CO2 23   < > 22 23 23   --   GLUCOSE 123*   < > 121* 104* 98  --   BUN 14   < > 11 13 11 12   CREATININE 0.66   < > 0.58 0.66 0.56 0.6  CALCIUM 8.4*   < > 8.0* 8.0* 8.3*  --   MG 1.7  --   --  1.7  --   --    < > = values in this interval not displayed.   Recent Labs    05/31/18 0837 07/30/18 2200 10/20/18 0600  AST 23 19 13   ALT 15 16 6*  ALKPHOS 84 94 125  BILITOT 0.6 0.8  --   PROT 5.9* 6.7  --   ALBUMIN 3.2* 3.7  --    Recent Labs    05/31/18 0837 06/01/18 0125 07/30/18 2200 08/01/18 0339 08/03/18 0708 08/04/18 0720 10/20/18 0600  WBC 12.1* 7.2 12.3* 8.6  --   --  6.8  NEUTROABS 10.9*  --  10.3*  --   --   --   --   HGB 11.6* 10.7* 10.8* 9.7* 8.3* 8.9* 11.2*  HCT 36.9 33.0* 35.8* 31.0* 26.2* 28.2* 34*  MCV 93.4 91.9 96.5 90.4  --   --   --   PLT 248 244 231 196  --   --  254   Lab Results  Component  Value Date   TSH 3.16 10/20/2018   Lab Results  Component Value Date   HGBA1C 6.2 10/20/2018   Lab Results  Component Value Date   CHOL  12/26/2006    175        ATP III CLASSIFICATION:  <200     mg/dL   Desirable  520-802  mg/dL   Borderline High  >=233    mg/dL   High   HDL 45 61/22/4497   LDLCALC (H) 12/26/2006    109        Total Cholesterol/HDL:CHD Risk Coronary Heart Disease Risk Table                     Men   Women  1/2 Average Risk   3.4   3.3   TRIG 103 12/26/2006   CHOLHDL 3.9 12/26/2006    Significant Diagnostic Results in last 30 days:  No results found.  Assessment/Plan 1. Frontotemporal dementia (HCC) The nurse gave her Ativan 0.5 mg during my visit and she appeared to calm down. Would continue this bid x 14 more days and the reassess. Continue seroquel 50 mg bid.   2. Acute blood loss anemia Improved Lab Results  Component Value Date   HGB 11.2 (A) 10/20/2018     3. Essential hypertension Controlled without meds  4. Depression, unspecified depression type It is difficult to assess for depression given her delusions and agitation. Would continue current regimen at this time.   5. Slow  transit constipation Continue senokot s 1 tab qod   6. History of gastrectomy Continue small meals and periodically monitor B12     Family/ staff Communication: staff   Labs/tests ordered:  NA

## 2019-03-08 ENCOUNTER — Other Ambulatory Visit: Payer: Self-pay | Admitting: Internal Medicine

## 2019-03-08 MED ORDER — LORAZEPAM 0.5 MG PO TABS: 0.5000 mg | ORAL_TABLET | Freq: Two times a day (BID) | ORAL | 0 refills | Status: DC | PRN

## 2019-03-10 ENCOUNTER — Non-Acute Institutional Stay (SKILLED_NURSING_FACILITY): Payer: Medicare Other | Admitting: Adult Health

## 2019-03-10 ENCOUNTER — Encounter: Payer: Self-pay | Admitting: Adult Health

## 2019-03-10 DIAGNOSIS — G3109 Other frontotemporal dementia: Secondary | ICD-10-CM | POA: Diagnosis not present

## 2019-03-10 DIAGNOSIS — K5901 Slow transit constipation: Secondary | ICD-10-CM | POA: Diagnosis not present

## 2019-03-10 DIAGNOSIS — E119 Type 2 diabetes mellitus without complications: Secondary | ICD-10-CM

## 2019-03-10 DIAGNOSIS — Z903 Acquired absence of stomach [part of]: Secondary | ICD-10-CM | POA: Diagnosis not present

## 2019-03-10 DIAGNOSIS — E039 Hypothyroidism, unspecified: Secondary | ICD-10-CM

## 2019-03-10 DIAGNOSIS — F0280 Dementia in other diseases classified elsewhere without behavioral disturbance: Secondary | ICD-10-CM

## 2019-03-10 DIAGNOSIS — D62 Acute posthemorrhagic anemia: Secondary | ICD-10-CM

## 2019-03-10 DIAGNOSIS — R634 Abnormal weight loss: Secondary | ICD-10-CM

## 2019-03-10 NOTE — Progress Notes (Signed)
[facility]   [facility] of Service:  SNF (31) Provider:   Peggye Leyhristy Maksym Pfiffner, ANP [facility] [phone]   Kermit Baloeed, Tiffany L, DO  Patient Care Team: Kermit Baloeed, Tiffany L, DO as PCP - General (Geriatric Medicine) Fletcher AnonWert, Xiana Carns, NP as Nurse Practitioner (Nurse Practitioner)  Extended Emergency Contact Information Primary Emergency Contact: Blake DivineForde,Francis Jr Address: [address]          [place], [place] [address] [place] Work Phone: [phone] Mobile Phone: [phone] Relation: Son Secondary Emergency Contact: Charleston PootForde,Terence  [place] Home Phone: [phone] Relation: Son  Code Status:  DNR Goals of care: Advanced Directive information Advanced Directives 10/20/2018  Does Patient Have a Medical Advance Directive? -  Type of Estate agentAdvance Directive Healthcare Power of [place];Living will;Out of facility DNR (pink MOST or yellow form)  Does patient want to make changes to medical advance directive? No - Patient declined  Copy of Healthcare Power of Attorney in Chart? Yes - validated most recent copy scanned in chart (See row information)  Pre-existing out of facility DNR order (yellow form or pink MOST form) Yellow form placed in chart (order not valid for inpatient use)     Chief Complaint  Patient presents with   Medical Management of Chronic Issues    HPI:  Pt is a 85 y.o. female seen today for medical management of chronic diseases.   She resides in skilled care due to advanced frontotemporal dementia. She is a fall risk and spends much of her day up at the nursing station where she can be monitored closely. She has periods of hallucinations and delusions. Nsg notes indicate they are using prn ativan for agitation with behaviors such as yelling, attempting to get out of her chair without help, and appearing upset. Its takes about 1 hr to work and then she sleeps too long. They are requesting to try a different  agent. She has lost 8 lbs in the past 2 months. Nutritionist added boost to her regimen scheduled in sept.  Wt Readings from Last 3 Encounters:  03/10/19 115 lb 9.6 oz (52.4 kg)  02/18/19 119 lb 6.4 oz (54.2 kg)  01/19/19 123 lb (55.8 kg)     Past Medical History:  Diagnosis Date   Angina pectoris (HCC)    intrascapular, abnormal cardiolote preop TKR   Anxiety    Arthritis    ASCVD (arteriosclerotic cardiovascular disease)    S/P NSTEMI with LCX stent 2008, cath on 06/29/2008 revealed widely patent circumflex   Bacterial infection 2016   Coronary artery disease    Depression    Diabetes mellitus    Frontotemporal dementia (HCC) 11/16/2018   GERD (gastroesophageal reflux disease)    Heart attack (HCC) 2010   Hyperlipidemia    Hypertension    Hypothyroidism    Myocardial infarction (HCC) [id number],2010   with stents   Osteoporosis    Rheumatoid arthritis (HCC) 2013   Trigger finger 02/2013   Vascular dementia (HCC)    Vertigo 2016   Vitamin D deficiency    Past Surgical History:  Procedure Laterality Date   ABDOMINAL HYSTERECTOMY  1969   BREAST BIOPSY  2005   right -benign   BREAST SURGERY  2004   right -cancerous-with radiation   broken finger  2006   CORONARY ANGIOPLASTY  2008, 2010   foot ampuation  12/2009   right   HAMMER TOE SURGERY  06/2009   right   HERNIA REPAIR  1999,2001   INTRAMEDULLARY (  IM) NAIL INTERTROCHANTERIC Left 07/31/2018   Procedure: INTRAMEDULLARY (IM) NAIL INTERTROCHANTRIC;  Surgeon: Roby Lofts, MD;  Location: [facility] OR;  Service: Orthopedics;  Laterality: Left;   KNEE ARTHROSCOPY  1980 ,1994   bilateral   TOTAL GASTRECTOMY  1983   with vagotomy   TOTAL KNEE ARTHROPLASTY  02/26/2012   Procedure: TOTAL KNEE ARTHROPLASTY;  Surgeon: Jacki Cones, MD;  Location: [facility] ORS;  Service: Orthopedics;  Laterality: Right;    No Known Allergies  Outpatient Encounter Medications as of 03/10/2019  Medication Sig    acetaminophen (TYLENOL) 325 MG tablet Take 650 mg by mouth every 6 (six) hours as needed.   busPIRone (BUSPAR) 15 MG tablet Take 15 mg by mouth 3 (three) times daily.   Cholecalciferol (VITAMIN D) 50 MCG (2000 UT) tablet Take 2,000 Units by mouth daily.   lactose free nutrition (BOOST) LIQD Take 237 mLs by mouth daily.   LORazepam (ATIVAN) 0.5 MG tablet Take 1 tablet (0.5 mg total) by mouth 2 (two) times daily as needed for anxiety. X 14 days   polyethylene glycol (MIRALAX / GLYCOLAX) 17 g packet Take 17 g by mouth daily.   QUEtiapine (SEROQUEL) 50 MG tablet Take 50 mg by mouth 2 (two) times daily.   sennosides-docusate sodium (SENOKOT-S) 8.6-50 MG tablet Take 1 tablet by mouth every other day.   sertraline (ZOLOFT) 100 MG tablet Take 100 mg by mouth daily.   No facility-administered encounter medications on file as of 03/10/2019.     Review of Systems  Unable to perform ROS: Dementia    Immunization History  Administered Date(s) Administered   DTaP 11/30/2018   Influenza, High Dose Seasonal PF 04/03/2017   Influenza-Unspecified 08/06/2018   PPD Test 10/27/2017   Pneumococcal Conjugate-13 12/02/2014   Pneumococcal Polysaccharide-23 05/27/2005   Tdap 11/26/2018   Zoster Recombinat (Shingrix) 11/24/2018   Pertinent  Health Maintenance Due  Topic Date Due   INFLUENZA VACCINE  12/26/2018   DEXA SCAN  Completed   PNA vac Low Risk Adult  Completed   Fall Risk  02/18/2019 10/16/2018  Falls in the past year? 1 1  Number falls in past yr: 1 1  Injury with Fall? 1 1  Risk for fall due to : History of fall(s);Impaired balance/gait;Impaired mobility History of fall(s);Impaired mobility  Follow up Falls evaluation completed;Falls prevention discussed Falls evaluation completed;Falls prevention discussed   Functional Status Survey:    Vitals:   03/10/19 1025  BP: 91/71  Pulse: 70  Resp: 20  Temp: (!) 97 F (36.1 C)  SpO2: 94%  Weight: 115 lb 9.6 oz (52.4 kg)    Body mass index is 19.84 kg/m. Physical Exam Vitals signs and nursing note reviewed.  Constitutional:      General: She is not in acute distress.    Appearance: She is not diaphoretic.  HENT:     Head: Normocephalic and atraumatic.  Neck:     Vascular: No JVD.  Cardiovascular:     Rate and Rhythm: Normal rate and regular rhythm.     Heart sounds: No murmur.  Pulmonary:     Effort: Pulmonary effort is normal. No respiratory distress.     Breath sounds: Normal breath sounds. No wheezing.  Abdominal:     General: Bowel sounds are normal. There is no distension.     Palpations: Abdomen is soft.  Musculoskeletal:     Right lower leg: No edema.     Left lower leg: No edema.  Skin:  General: Skin is warm and dry.  Neurological:     General: No focal deficit present.     Mental Status: She is alert. Mental status is at baseline.     Comments: Alert, babbles. Pleasant and not agitated.   Psychiatric:        Mood and Affect: Mood normal.     Labs reviewed: Recent Labs    05/31/18 0837  08/02/18 0524 08/03/18 0708 08/04/18 0720 10/20/18 0600  NA 138   < > 138 140 140 141  K 3.8   < > 3.4* 3.3* 3.5 4.4  CL 107   < > 110 109 107  --   CO2 23   < > 22 23 23   --   GLUCOSE 123*   < > 121* 104* 98  --   BUN 14   < > 11 13 11 12   CREATININE 0.66   < > 0.58 0.66 0.56 0.6  CALCIUM 8.4*   < > 8.0* 8.0* 8.3*  --   MG 1.7  --   --  1.7  --   --    < > = values in this interval not displayed.   Recent Labs    05/31/18 0837 07/30/18 2200 10/20/18 0600  AST 23 19 13   ALT 15 16 6*  ALKPHOS 84 94 125  BILITOT 0.6 0.8  --   PROT 5.9* 6.7  --   ALBUMIN 3.2* 3.7  --    Recent Labs    05/31/18 0837 06/01/18 0125 07/30/18 2200 08/01/18 0339 08/03/18 0708 08/04/18 0720 10/20/18 0600  WBC 12.1* 7.2 12.3* 8.6  --   --  6.8  NEUTROABS 10.9*  --  10.3*  --   --   --   --   HGB 11.6* 10.7* 10.8* 9.7* 8.3* 8.9* 11.2*  HCT 36.9 33.0* 35.8* 31.0* 26.2* 28.2* 34*  MCV 93.4  91.9 96.5 90.4  --   --   --   PLT 248 244 231 196  --   --  254   Lab Results  Component Value Date   TSH 3.16 10/20/2018   Lab Results  Component Value Date   HGBA1C 6.2 10/20/2018   Lab Results  Component Value Date   CHOL  12/26/2006    175        ATP III CLASSIFICATION:  <200     mg/dL   Desirable  518-984  mg/dL   Borderline High  >=210    mg/dL   High   HDL 45 31/28/1188   LDLCALC (H) 12/26/2006    109        Total Cholesterol/HDL:CHD Risk Coronary Heart Disease Risk Table                     Men   Women  1/2 Average Risk   3.4   3.3   TRIG 103 12/26/2006   CHOLHDL 3.9 12/26/2006    Significant Diagnostic Results in last 30 days:  No results found.  Assessment/Plan 1. Frontotemporal dementia (HCC) D/C ativan Try vistaril 25 mg q 8 hrs prn agitation Increase serqouel to 50 mg in the am and 75 mg in the pm  2. History of gastrectomy May contribute to her recent weight loss. B12 ok. Continue to monitor intake and weight. See below  3. Slow transit constipation Controlled  Continue senokot 1 tab qod and miralax 17 grams qd  4. Acquired hypothyroidism Lab Results  Component Value Date  TSH 3.16 10/20/2018  Not currently on supplementation. She was not on therapy [facility]. She is losing weight. Given her advancing dementia would not treat at this time but recheck in 1 year.    5. Acute blood loss anemia Lab Results  Component Value Date   HGB 11.2 (A) 10/20/2018  Stabilized after surgery. Not on iron. Would recheck annually due to her hx of gastrectomy  6. Weight loss ? If this is due to her continued issues with anxiety and behaviors associated with dementia. Also she has had lice and this confounded her agitation (which resolved).  Continue boost and weight monitoring and address again next month.    Family/ staff Communication: staff  Labs/tests ordered:  hgb A1C in the am for drug therapy monitoring with known hx of DM

## 2019-03-11 LAB — HEMOGLOBIN A1C: Hemoglobin A1C: 5.9

## 2019-03-18 ENCOUNTER — Non-Acute Institutional Stay (SKILLED_NURSING_FACILITY): Payer: Medicare Other | Admitting: Adult Health

## 2019-03-18 DIAGNOSIS — R400 Somnolence: Secondary | ICD-10-CM

## 2019-03-18 DIAGNOSIS — I950 Idiopathic hypotension: Secondary | ICD-10-CM

## 2019-03-18 MED ORDER — MORPHINE SULFATE (CONCENTRATE) 20 MG/ML PO SOLN: 5.0000 mg | Freq: Four times a day (QID) | ORAL | 0 refills | Status: DC | PRN

## 2019-03-19 ENCOUNTER — Encounter: Payer: Self-pay | Admitting: Adult Health

## 2019-03-19 NOTE — Progress Notes (Signed)
Location:  Medical illustrator of Service:  SNF (31) Provider:   Peggye Ley, ANP [facility] [phone]   Kermit Balo, DO  Patient Care Team: Kermit Balo, DO as PCP - General (Geriatric Medicine) Fletcher Anon, NP as Nurse Practitioner (Nurse Practitioner)  Extended Emergency Contact Information Primary Emergency Contact: Blake Divine Address: [address]          [place], [place] [address] [place] of [place] Work Phone: [phone] Mobile Phone: [phone] Relation: Son Secondary Emergency Contact: [place] Poot [place] of [place] Home Phone: [phone] Relation: Son  Code Status:  DNR Goals of care: Advanced Directive information Advanced Directives 10/20/2018  Does Patient Have a Medical Advance Directive? -  Type of Estate agent of [place];Living will;Out of facility DNR (pink MOST or yellow form)  Does patient want to make changes to medical advance directive? No - Patient declined  Copy of Healthcare Power of Attorney in Chart? Yes - validated most recent copy scanned in chart (See row information)  Pre-existing out of facility DNR order (yellow form or pink MOST form) Yellow form placed in chart (order not valid for inpatient use)     Chief Complaint  Patient presents with  . Acute Visit    hypotension    HPI:  Pt is a 85 y.o. female seen today for an acute visit for hypotension. She has an underlying hx significant for frontal dementia, CAD, MI, GERD, DM II, hypothyroidism, OP, OA, gastrectomy, HLD anxiety, anemia, and constipation.  Around lunch Ms. Pankonin looked pale and weak. She did not eat lunch. She was placed in bed. At that time her vitals were WNL. Later in the afternoon her vitals were checked and her bp was 66/54 manually. HR 95, sats 95% temp 97.9.  She continues to look pale and lethargic. She has underlying dementia and is not able to provide a history. There are no  reports of decreased oral intake to indicate hypovolemia. She was having we briefs and moving her bowels. No cough, sputum production, dysuria, abd pain, n, v, d, etc. She is not on any medications that would cause this problem.    Past Medical History:  Diagnosis Date  . Angina pectoris (HCC)    intrascapular, abnormal cardiolote preop TKR  . Anxiety   . Arthritis   . ASCVD (arteriosclerotic cardiovascular disease)    S/P NSTEMI with LCX stent 2008, cath on 06/29/2008 revealed widely patent circumflex  . Bacterial infection 2016  . Coronary artery disease   . Depression   . Diabetes mellitus   . Frontotemporal dementia (HCC) 11/16/2018  . GERD (gastroesophageal reflux disease)   . Heart attack (HCC) 2010  . Hyperlipidemia   . Hypertension   . Hypothyroidism   . Myocardial infarction [facility]) [id number]   with stents  . Osteoporosis   . Rheumatoid arthritis (HCC) 2013  . Trigger finger 02/2013  . Vascular dementia (HCC)   . Vertigo 2016  . Vitamin D deficiency    Past Surgical History:  Procedure Laterality Date  . ABDOMINAL HYSTERECTOMY  1969  . BREAST BIOPSY  2005   right -benign  . BREAST SURGERY  2004   right -cancerous-with radiation  . broken finger  2006  . CORONARY ANGIOPLASTY  2008, 2010  . foot ampuation  12/2009   right  . HAMMER TOE SURGERY  06/2009   right  . HERNIA REPAIR  [id number]  . INTRAMEDULLARY (IM) NAIL INTERTROCHANTERIC Left 07/31/2018  Procedure: INTRAMEDULLARY (IM) NAIL INTERTROCHANTRIC;  Surgeon: Shona Needles, MD;  Location: [place];  Service: Orthopedics;  Laterality: Left;  . KNEE ARTHROSCOPY  1980 ,1994   bilateral  . TOTAL GASTRECTOMY  1983   with vagotomy  . TOTAL KNEE ARTHROPLASTY  02/26/2012   Procedure: TOTAL KNEE ARTHROPLASTY;  Surgeon: Tobi Bastos, MD;  Location: [facility];  Service: Orthopedics;  Laterality: Right;    No Known Allergies  Outpatient Encounter Medications as of 03/18/2019  Medication Sig  . acetaminophen (TYLENOL)  325 MG tablet Take 650 mg by mouth every 6 (six) hours as needed.  . busPIRone (BUSPAR) 15 MG tablet Take 15 mg by mouth 3 (three) times daily.  . Cholecalciferol (VITAMIN D) 50 MCG (2000 UT) tablet Take 2,000 Units by mouth daily.  Marland Kitchen lactose free nutrition (BOOST) LIQD Take 237 mLs by mouth daily.  Marland Kitchen LORazepam (ATIVAN) 0.5 MG tablet Take 1 tablet (0.5 mg total) by mouth 2 (two) times daily as needed for anxiety. X 14 days  . morphine (ROXANOL) 20 MG/ML concentrated solution Take 0.25 mLs (5 mg total) by mouth every 6 (six) hours as needed for severe pain.  . polyethylene glycol (MIRALAX / GLYCOLAX) 17 g packet Take 17 g by mouth daily.  . QUEtiapine (SEROQUEL) 50 MG tablet Take 50 mg by mouth 2 (two) times daily.  . sennosides-docusate sodium (SENOKOT-S) 8.6-50 MG tablet Take 1 tablet by mouth every other day.  . sertraline (ZOLOFT) 100 MG tablet Take 100 mg by mouth daily.   No facility-administered encounter medications on file as of 03/18/2019.     Review of Systems  Unable to perform ROS: Dementia    Immunization History  Administered Date(s) Administered  . DTaP 11/30/2018  . Influenza, High Dose Seasonal PF 04/03/2017  . Influenza-Unspecified 08/06/2018  . PPD Test 10/27/2017  . Pneumococcal Conjugate-13 12/02/2014  . Pneumococcal Polysaccharide-23 05/27/2005  . Tdap 11/26/2018  . Zoster Recombinat (Shingrix) 11/24/2018   Pertinent  Health Maintenance Due  Topic Date Due  . FOOT EXAM  11[birth year]06/1937  . OPHTHALMOLOGY EXAM  11/06/1937  . URINE MICROALBUMIN  11/06/1937  . INFLUENZA VACCINE  12/26/2018  . HEMOGLOBIN A1C  04/22/2019  . DEXA SCAN  Completed  . PNA vac Low Risk Adult  Completed   Fall Risk  02/18/2019 10/16/2018  Falls in the past year? 1 1  Number falls in past yr: 1 1  Injury with Fall? 1 1  Risk for fall due to : History of fall(s);Impaired balance/gait;Impaired mobility History of fall(s);Impaired mobility  Follow up Falls evaluation completed;Falls  prevention discussed Falls evaluation completed;Falls prevention discussed   Functional Status Survey:    Vitals:   03/19/19 0900  BP: (!) 66/54  Pulse: 95  Resp: 20  Temp: 97.9 F (36.6 C)  SpO2: 95%   There is no height or weight on file to calculate BMI. Physical Exam Vitals signs and nursing note reviewed.  Constitutional:      Appearance: She is ill-appearing and toxic-appearing. She is not diaphoretic.  HENT:     Head: Normocephalic and atraumatic.     Nose: Nose normal. No congestion.     Mouth/Throat:     Mouth: Mucous membranes are moist.     Pharynx: Oropharynx is clear. No oropharyngeal exudate.  Eyes:     General:        Right eye: No discharge.        Left eye: No discharge.     Conjunctiva/sclera: Conjunctivae  normal.     Pupils: Pupils are equal, round, and reactive to light.  Neck:     Musculoskeletal: No neck rigidity.     Vascular: No JVD.  Cardiovascular:     Rate and Rhythm: Normal rate and regular rhythm.     Heart sounds: No murmur.  Pulmonary:     Effort: Pulmonary effort is normal. No respiratory distress.     Breath sounds: Normal breath sounds. No wheezing.  Abdominal:     General: Bowel sounds are normal. There is no distension.     Palpations: Abdomen is soft.     Tenderness: There is abdominal tenderness (grimaces when the lower abd is palpated).  Musculoskeletal:        General: No swelling, tenderness, deformity or signs of injury.     Right lower leg: No edema.     Left lower leg: No edema.  Lymphadenopathy:     Cervical: No cervical adenopathy.  Skin:    General: Skin is warm and dry.     Coloration: Skin is pale.  Neurological:     General: No focal deficit present.     Comments: Lethargic but opens her eyes with verbal stim. Not able to f/c for further exam     Labs reviewed: Recent Labs    05/31/18 0837  08/02/18 0524 08/03/18 0708 08/04/18 0720 10/20/18 0600  NA 138   < > 138 140 140 141  K 3.8   < > 3.4* 3.3*  3.5 4.4  CL 107   < > 110 109 107  --   CO2 23   < > 22 23 23   --   GLUCOSE 123*   < > 121* 104* 98  --   BUN 14   < > 11 13 11 12   CREATININE 0.66   < > 0.58 0.66 0.56 0.6  CALCIUM 8.4*   < > 8.0* 8.0* 8.3*  --   MG 1.7  --   --  1.7  --   --    < > = values in this interval not displayed.   Recent Labs    05/31/18 0837 07/30/18 2200 10/20/18 0600  AST 23 19 13   ALT 15 16 6*  ALKPHOS 84 94 125  BILITOT 0.6 0.8  --   PROT 5.9* 6.7  --   ALBUMIN 3.2* 3.7  --    Recent Labs    05/31/18 0837 06/01/18 0125 07/30/18 2200 08/01/18 0339 08/03/18 0708 08/04/18 0720 10/20/18 0600  WBC 12.1* 7.2 12.3* 8.6  --   --  6.8  NEUTROABS 10.9*  --  10.3*  --   --   --   --   HGB 11.6* 10.7* 10.8* 9.7* 8.3* 8.9* 11.2*  HCT 36.9 33.0* 35.8* 31.0* 26.2* 28.2* 34*  MCV 93.4 91.9 96.5 90.4  --   --   --   PLT 248 244 231 196  --   --  254   Lab Results  Component Value Date   TSH 3.16 10/20/2018   Lab Results  Component Value Date   HGBA1C 6.2 10/20/2018   Lab Results  Component Value Date   CHOL  12/26/2006    175        ATP III CLASSIFICATION:  <200     mg/dL   Desirable  161-096200-239  mg/dL   Borderline High  >=045>=240    mg/dL   High   HDL 45 40/98/119108/05/2006   LDLCALC (H) 12/26/2006  109        Total Cholesterol/HDL:CHD Risk Coronary Heart Disease Risk Table                     Men   Women  1/2 Average Risk   3.4   3.3   TRIG 103 12/26/2006   CHOLHDL 3.9 12/26/2006    Significant Diagnostic Results in last 30 days:  No results found.  Assessment/Plan 1. Idiopathic hypotension ? If she is going into septic shock. We have placed her flat in bed with feet elevated with no improvement in bp or color. Due to her underlying dementia she would be likely to pull her IV out if we were start one and given the severe nature of her condition she would need to be sent out if her family wanted aggressive care.  I called her son Thelma Barge and we discussed that he did not want her to receive  aggressive care. His wish for her was to be kept comfortable here at [facility] given her advanced age and dementia. She does appear comfortable at this time but if she develops any discomfort we discussed using roxanol for pain or sob and he agreed. We decided not to provide any additional testing or antibiotics or IVF. DNR in place. I let her son know that he can come visit any time due to the severity of her condition and he indicated that he will be by in the morning.   2. Somnolence This is likely due to hypoperfusion to her brain. See above.    Family/ staff Communication: son Thelma Barge Labs/tests ordered:  NA

## 2019-03-22 ENCOUNTER — Encounter: Payer: Self-pay | Admitting: Adult Health

## 2019-03-22 ENCOUNTER — Non-Acute Institutional Stay (SKILLED_NURSING_FACILITY): Payer: Medicare Other | Admitting: Adult Health

## 2019-03-22 DIAGNOSIS — L89301 Pressure ulcer of unspecified buttock, stage 1: Secondary | ICD-10-CM

## 2019-03-22 DIAGNOSIS — K5901 Slow transit constipation: Secondary | ICD-10-CM

## 2019-03-22 DIAGNOSIS — R1312 Dysphagia, oropharyngeal phase: Secondary | ICD-10-CM

## 2019-03-22 DIAGNOSIS — E860 Dehydration: Secondary | ICD-10-CM | POA: Diagnosis not present

## 2019-03-22 DIAGNOSIS — Z515 Encounter for palliative care: Secondary | ICD-10-CM

## 2019-03-22 NOTE — Progress Notes (Signed)
Location:  Medical illustrator of Service:  SNF (31) Provider:   Peggye Ley, ANP [facility] [phone]   Michelle Balo, DO  Patient Care Team: Michelle Balo, DO as PCP - General (Geriatric Medicine) Michelle Anon, NP as Nurse Practitioner (Nurse Practitioner)  Extended Emergency Contact Information Primary Emergency Contact: Blake Divine Address: [address]          [place], [place] [address] [place] of [place] Work Phone: [phone] Mobile Phone: [phone] Relation: Son Secondary Emergency Contact: [place] Poot [place] Home Phone: [phone] Relation: Son  Code Status:  DNR Goals of care: Advanced Directive information Advanced Directives 10/20/2018  Does Patient Have a Medical Advance Directive? -  Type of Estate agent of [place];Living will;Out of facility DNR (pink MOST or yellow form)  Does patient want to make changes to medical advance directive? No - Patient declined  Copy of Healthcare Power of Attorney in Chart? Yes - validated most recent copy scanned in chart (See row information)  Pre-existing out of facility DNR order (yellow form or pink MOST form) Yellow form placed in chart (order not valid for inpatient use)     Chief Complaint  Patient presents with  . Acute Visit    sacral pressure ulcer, end of life care    HPI:  Pt is a 85 y.o. female seen today for an acute visit for sacral pressure ulcer and end of life care. [name] has advanced frontal dementia. She is dependent on staff for all ADLs. She developed hypotension of unclear etiology with no there symptoms and was quite pale on 10/22.  Her family opted for comfort care. Her bp improved but she continues to be weak, eat less, and need additional help. She can not tolerate sitting up either. She has trouble swallowing regular food and so the nurse changed her diet to puree which she seems to be  tolerating well. She ate 25% of her breakfast and drank some boost without difficulty. She is not in pain. No fever or other complaints. She has an area of redness to her bottom that is not painful. The nurse is going to get an air mattress for this problem and have the wound care nurse assess her.    Past Medical History:  Diagnosis Date  . Angina pectoris (HCC)    intrascapular, abnormal cardiolote preop TKR  . Anxiety   . Arthritis   . ASCVD (arteriosclerotic cardiovascular disease)    S/P NSTEMI with LCX stent 2008, cath on 06/29/2008 revealed widely patent circumflex  . Bacterial infection 2016  . Coronary artery disease   . Depression   . Diabetes mellitus   . Frontotemporal dementia (HCC) 11/16/2018  . GERD (gastroesophageal reflux disease)   . Heart attack (HCC) 2010  . Hyperlipidemia   . Hypertension   . Hypothyroidism   . Myocardial infarction [facility]) [id number]   with stents  . Osteoporosis   . Rheumatoid arthritis (HCC) 2013  . Trigger finger 02/2013  . Vascular dementia (HCC)   . Vertigo 2016  . Vitamin D deficiency    Past Surgical History:  Procedure Laterality Date  . ABDOMINAL HYSTERECTOMY  1969  . BREAST BIOPSY  2005   right -benign  . BREAST SURGERY  2004   right -cancerous-with radiation  . broken finger  2006  . CORONARY ANGIOPLASTY  2008, 2010  . foot ampuation  12/2009   right  . HAMMER TOE SURGERY  06/2009  right  . HERNIA REPAIR  [id number]  . INTRAMEDULLARY (IM) NAIL INTERTROCHANTERIC Left 07/31/2018   Procedure: INTRAMEDULLARY (IM) NAIL INTERTROCHANTRIC;  Surgeon: Roby LoftsHaddix, Kevin P, MD;  Location: [facility];  Service: Orthopedics;  Laterality: Left;  . KNEE ARTHROSCOPY  1980 ,1994   bilateral  . TOTAL GASTRECTOMY  1983   with vagotomy  . TOTAL KNEE ARTHROPLASTY  02/26/2012   Procedure: TOTAL KNEE ARTHROPLASTY;  Surgeon: Jacki Conesonald A Gioffre, MD;  Location: [facility];  Service: Orthopedics;  Laterality: Right;    No Known Allergies  Outpatient Encounter  Medications as of 03/22/2019  Medication Sig  . acetaminophen (TYLENOL) 325 MG tablet Take 650 mg by mouth every 6 (six) hours as needed.  . busPIRone (BUSPAR) 15 MG tablet Take 15 mg by mouth 3 (three) times daily.  . Cholecalciferol (VITAMIN D) 50 MCG (2000 UT) tablet Take 2,000 Units by mouth daily.  Marland Kitchen. lactose free nutrition (BOOST) LIQD Take 237 mLs by mouth daily.  Marland Kitchen. LORazepam (ATIVAN) 0.5 MG tablet Take 1 tablet (0.5 mg total) by mouth 2 (two) times daily as needed for anxiety. X 14 days  . morphine (ROXANOL) 20 MG/ML concentrated solution Take 0.25 mLs (5 mg total) by mouth every 6 (six) hours as needed for severe pain.  . polyethylene glycol (MIRALAX / GLYCOLAX) 17 g packet Take 17 g by mouth daily.  . QUEtiapine (SEROQUEL) 50 MG tablet Take 50 mg by mouth 2 (two) times daily.  . sennosides-docusate sodium (SENOKOT-S) 8.6-50 MG tablet Take 1 tablet by mouth every other day.  . sertraline (ZOLOFT) 100 MG tablet Take 100 mg by mouth daily.   No facility-administered encounter medications on file as of 03/22/2019.     Review of Systems  Unable to perform ROS: Dementia    Immunization History  Administered Date(s) Administered  . DTaP 11/30/2018  . Influenza, High Dose Seasonal PF 04/03/2017, 03/11/2019  . Influenza-Unspecified 08/06/2018  . PPD Test 10/27/2017  . Pneumococcal Conjugate-13 12/02/2014  . Pneumococcal Polysaccharide-23 05/27/2005  . Tdap 11/26/2018  . Zoster Recombinat (Shingrix) 11/24/2018   Pertinent  Health Maintenance Due  Topic Date Due  . FOOT EXAM  11/06/1937  . OPHTHALMOLOGY EXAM  11/06/1937  . URINE MICROALBUMIN  11/06/1937  . HEMOGLOBIN A1C  04/22/2019  . INFLUENZA VACCINE  Completed  . DEXA SCAN  Completed  . PNA vac Low Risk Adult  Completed   Fall Risk  02/18/2019 10/16/2018  Falls in the past year? 1 1  Number falls in past yr: 1 1  Injury with Fall? 1 1  Risk for fall due to : History of fall(s);Impaired balance/gait;Impaired mobility  History of fall(s);Impaired mobility  Follow up Falls evaluation completed;Falls prevention discussed Falls evaluation completed;Falls prevention discussed   Functional Status Survey:    Vitals:   03/22/19 1018  BP: 128/68  Pulse: (!) 55  Temp: 98.8 F (37.1 C)  SpO2: 96%   There is no height or weight on file to calculate BMI. Physical Exam Vitals signs and nursing note reviewed.  Constitutional:      General: She is not in acute distress.    Appearance: She is not diaphoretic.  HENT:     Head: Normocephalic and atraumatic.     Nose: Nose normal. No congestion.     Mouth/Throat:     Mouth: Mucous membranes are dry.     Comments: Refused to open her mouth wider for further exam Eyes:     General:  Right eye: No discharge.     Conjunctiva/sclera: Conjunctivae normal.     Pupils: Pupils are equal, round, and reactive to light.  Neck:     Vascular: No JVD.  Cardiovascular:     Rate and Rhythm: Normal rate and regular rhythm.     Heart sounds: No murmur.  Pulmonary:     Effort: Pulmonary effort is normal. No respiratory distress.     Breath sounds: Normal breath sounds. No wheezing.  Abdominal:     General: Bowel sounds are normal. There is no distension.     Palpations: Abdomen is soft.  Musculoskeletal:     Right lower leg: No edema.     Left lower leg: No edema.  Skin:    General: Skin is warm and dry.     Comments: Sacral area with redness that does not blanch. No open areas or tenderness or swelling.   Neurological:     General: No focal deficit present.     Mental Status: She is alert. Mental status is at baseline.     Comments: Mumbles, not able to f/c or answer q's  Psychiatric:     Comments: pleasant     Labs reviewed: Recent Labs    05/31/18 0837  08/02/18 0524 08/03/18 0708 08/04/18 0720 10/20/18 0600  NA 138   < > 138 140 140 141  K 3.8   < > 3.4* 3.3* 3.5 4.4  CL 107   < > 110 109 107  --   CO2 23   < > 22 23 23   --   GLUCOSE 123*    < > 121* 104* 98  --   BUN 14   < > 11 13 11 12   CREATININE 0.66   < > 0.58 0.66 0.56 0.6  CALCIUM 8.4*   < > 8.0* 8.0* 8.3*  --   MG 1.7  --   --  1.7  --   --    < > = values in this interval not displayed.   Recent Labs    05/31/18 0837 07/30/18 2200 10/20/18 0600  AST 23 19 13   ALT 15 16 6*  ALKPHOS 84 94 125  BILITOT 0.6 0.8  --   PROT 5.9* 6.7  --   ALBUMIN 3.2* 3.7  --    Recent Labs    05/31/18 0837 06/01/18 0125 07/30/18 2200 08/01/18 0339 08/03/18 0708 08/04/18 0720 10/20/18 0600  WBC 12.1* 7.2 12.3* 8.6  --   --  6.8  NEUTROABS 10.9*  --  10.3*  --   --   --   --   HGB 11.6* 10.7* 10.8* 9.7* 8.3* 8.9* 11.2*  HCT 36.9 33.0* 35.8* 31.0* 26.2* 28.2* 34*  MCV 93.4 91.9 96.5 90.4  --   --   --   PLT 248 244 231 196  --   --  254   Lab Results  Component Value Date   TSH 3.16 10/20/2018   Lab Results  Component Value Date   HGBA1C 5.9 03/11/2019   Lab Results  Component Value Date   CHOL  12/26/2006    175        ATP III CLASSIFICATION:  <200     mg/dL   Desirable  200-239  mg/dL   Borderline High  >=240    mg/dL   High   HDL 45 12/26/2006   LDLCALC (H) 12/26/2006    109        Total Cholesterol/HDL:CHD Risk Coronary  Heart Disease Risk Table                     Men   Women  1/2 Average Risk   3.4   3.3   TRIG 103 12/26/2006   CHOLHDL 3.9 12/26/2006    Significant Diagnostic Results in last 30 days:  No results found.  Assessment/Plan 1. Pressure injury of buttock, stage 1, unspecified laterality Wound care nurse to follow. Turn q 2 hrs and apply barrier cream. Obtain air mattress. Float heels.  2. Dehydration Decreased intake with dry mouth. Her goals of care are comfort based so no further intervention is warranted other than to provide mouth care and encourage oral fluid as tolerated.   3. End of life care Ms. Premo experienced hypotension of unclear etiology on 10/22.  Her bp has improved as long as she is recumbent but if she is  upright she becomes lethargic. She is still eating and drinking and making urine. She does not appear to be in the active dying process. She is not cyanotic and sats are WNL. She is appearing more frail and weaker.  Her daughter n law is at the bedside and we discussed that her prognosis is unclear since we did not work up this issue. She is not showing any signs of distress at this time but if she develops any sob or pain roxanol will be used for comfort. The nurse will provide the family with a gone from my sight booklet. I offered hospice care but they do not feel they need additional support at this time.   4. Slow transit constipation Decrease miralax to qod due to frequent stools  5. Oropharyngeal dysphagia Continue puree diet, 1:1 supervision and asp prec  Family/ staff Communication: spoke with her daughter in law at the bedside, Ms. Fern  Labs/tests ordered:  NA

## 2019-03-29 ENCOUNTER — Encounter: Payer: Self-pay | Admitting: Adult Health

## 2019-03-29 ENCOUNTER — Non-Acute Institutional Stay (SKILLED_NURSING_FACILITY): Payer: Medicare Other | Admitting: Adult Health

## 2019-03-29 DIAGNOSIS — R1312 Dysphagia, oropharyngeal phase: Secondary | ICD-10-CM

## 2019-03-29 DIAGNOSIS — R52 Pain, unspecified: Secondary | ICD-10-CM

## 2019-03-29 DIAGNOSIS — Z515 Encounter for palliative care: Secondary | ICD-10-CM | POA: Diagnosis not present

## 2019-03-29 DIAGNOSIS — R634 Abnormal weight loss: Secondary | ICD-10-CM | POA: Diagnosis not present

## 2019-03-29 DIAGNOSIS — E860 Dehydration: Secondary | ICD-10-CM

## 2019-03-29 MED ORDER — LORAZEPAM 0.5 MG PO TABS: 0.2500 mg | ORAL_TABLET | Freq: Three times a day (TID) | ORAL | 1 refills | Status: AC | PRN

## 2019-03-29 NOTE — Progress Notes (Signed)
[facility]   Place of Service:  SNF (31) Provider:   Peggye Leyhristy Reagyn Facemire, ANP [facility] [phone]   Kermit Baloeed, Tiffany L, DO  Patient Care Team: Kermit Baloeed, Tiffany L, DO as PCP - General (Geriatric Medicine) Fletcher AnonWert, Denham Mose, NP as Nurse Practitioner (Nurse Practitioner)  Extended Emergency Contact Information Primary Emergency Contact: Blake DivineForde,Francis Jr Address: [address]          [place], [place] [address] [place] Work Phone: [phone] Mobile Phone: [phone] Relation: Son Secondary Emergency Contact: Charleston PootForde,Terence  [place] Home Phone: [phone] Relation: Son  Code Status:  DNR Goals of care: Advanced Directive information Advanced Directives 10/20/2018  Does Patient Have a Medical Advance Directive? -  Type of Estate agentAdvance Directive Healthcare Power of [place];Living will;Out of facility DNR (pink MOST or yellow form)  Does patient want to make changes to medical advance directive? No - Patient declined  Copy of Healthcare Power of Attorney in Chart? Yes - validated most recent copy scanned in chart (See row information)  Pre-existing out of facility DNR order (yellow form or pink MOST form) Yellow form placed in chart (order not valid for inpatient use)     Chief Complaint  Patient presents with  . Acute Visit    request for ativan, agitation and pain management    HPI:  Pt is a 85 y.o. female seen today for an acute visit for request for ativan for agitation and pain management. On 10/22 Michelle [name] went into shock with a severely low bp in the 60's systolic. She is a DNR and has severe frontotemporal dementia. We opted to provide comfort care only due to her age and underlying health issues. Differential dx included septic or cardiogenic shock. Since that time she has not been eating well and losing weight. She had some trouble swallowing and was downgraded to a puree diet. She is not taking in much  solid food but does like boost. Her bp has improved to 98/56 but she tends to get lethargic when upright in the chair. Vistaril was discontinued last week as the insurance company would not cover it. The nurse is observing some moaning even after giving 5 mg of morphine. She has some shivering and grimacing on her face as well. No fever or cough. No n,v, or diarrhea. She is belching a lot.    Past Medical History:  Diagnosis Date  . Angina pectoris (HCC)    intrascapular, abnormal cardiolote preop TKR  . Anxiety   . Arthritis   . ASCVD (arteriosclerotic cardiovascular disease)    S/P NSTEMI with LCX stent 2008, cath on 06/29/2008 revealed widely patent circumflex  . Bacterial infection 2016  . Coronary artery disease   . Depression   . Diabetes mellitus   . Frontotemporal dementia (HCC) 11/16/2018  . GERD (gastroesophageal reflux disease)   . Heart attack (HCC) 2010  . Hyperlipidemia   . Hypertension   . Hypothyroidism   . Myocardial infarction [facility](HCC) [id number]   with stents  . Osteoporosis   . Rheumatoid arthritis (HCC) 2013  . Trigger finger 02/2013  . Vascular dementia (HCC)   . Vertigo 2016  . Vitamin D deficiency    Past Surgical History:  Procedure Laterality Date  . ABDOMINAL HYSTERECTOMY  1969  . BREAST BIOPSY  2005   right -benign  . BREAST SURGERY  2004   right -cancerous-with radiation  . broken finger  2006  . CORONARY ANGIOPLASTY  2008, 2010  .  foot ampuation  12/2009   right  . HAMMER TOE SURGERY  06/2009   right  . HERNIA REPAIR  [id number]  . INTRAMEDULLARY (IM) NAIL INTERTROCHANTERIC Left 07/31/2018   Procedure: INTRAMEDULLARY (IM) NAIL INTERTROCHANTRIC;  Surgeon: Shona Needles, MD;  Location: [place];  Service: Orthopedics;  Laterality: Left;  . KNEE ARTHROSCOPY  1980 ,1994   bilateral  . TOTAL GASTRECTOMY  1983   with vagotomy  . TOTAL KNEE ARTHROPLASTY  02/26/2012   Procedure: TOTAL KNEE ARTHROPLASTY;  Surgeon: Tobi Bastos, MD;  [facility];   Service: Orthopedics;  Laterality: Right;    No Known Allergies  Outpatient Encounter Medications as of 03/29/2019  Medication Sig  . morphine (ROXANOL) 20 MG/ML concentrated solution Take 0.25 mLs (5 mg total) by mouth every 6 (six) hours as needed for severe pain. (Patient taking differently: Take 5 mg by mouth every 4 (four) hours as needed for severe pain. )  . acetaminophen (TYLENOL) 325 MG tablet Take 650 mg by mouth every 6 (six) hours as needed.  . busPIRone (BUSPAR) 15 MG tablet Take 15 mg by mouth 3 (three) times daily.  . Cholecalciferol (VITAMIN D) 50 MCG (2000 UT) tablet Take 2,000 Units by mouth daily.  Marland Kitchen lactose free nutrition (BOOST) LIQD Take 237 mLs by mouth daily as needed.   Marland Kitchen LORazepam (ATIVAN) 0.5 MG tablet Take 0.5 tablets (0.25 mg total) by mouth every 8 (eight) hours as needed for anxiety. X 14 days  . polyethylene glycol (MIRALAX / GLYCOLAX) 17 g packet Take 17 g by mouth every other day.   Marland Kitchen QUEtiapine (SEROQUEL) 50 MG tablet Take 50 mg by mouth 2 (two) times daily. 50 mg in the morning and 75 mg in the pm  . sennosides-docusate sodium (SENOKOT-S) 8.6-50 MG tablet Take 1 tablet by mouth every other day.  . sertraline (ZOLOFT) 100 MG tablet Take 100 mg by mouth daily.  . [DISCONTINUED] LORazepam (ATIVAN) 0.5 MG tablet Take 1 tablet (0.5 mg total) by mouth 2 (two) times daily as needed for anxiety. X 14 days (Patient taking differently: Take 0.5 mg by mouth every 8 (eight) hours as needed for anxiety. X 14 days )   No facility-administered encounter medications on file as of 03/29/2019.     Review of Systems  Unable to perform ROS: Dementia    Immunization History  Administered Date(s) Administered  . DTaP 11/30/2018  . Influenza, High Dose Seasonal PF 04/03/2017, 03/11/2019  . Influenza-Unspecified 08/06/2018  . PPD Test 10/27/2017  . Pneumococcal Conjugate-13 12/02/2014  . Pneumococcal Polysaccharide-23 05/27/2005  . Tdap 11/26/2018  . Zoster Recombinat  (Shingrix) 11/24/2018   Pertinent  Health Maintenance Due  Topic Date Due  . FOOT EXAM  11[birth year]06/1937  . OPHTHALMOLOGY EXAM  11[birth year]06/1937  . URINE MICROALBUMIN  11/06/1937  . HEMOGLOBIN A1C  09/09/2019  . INFLUENZA VACCINE  Completed  . DEXA SCAN  Completed  . PNA vac Low Risk Adult  Completed   Fall Risk  02/18/2019 10/16/2018  Falls in the past year? 1 1  Number falls in past yr: 1 1  Injury with Fall? 1 1  Risk for fall due to : History of fall(s);Impaired balance/gait;Impaired mobility History of fall(s);Impaired mobility  Follow up Falls evaluation completed;Falls prevention discussed Falls evaluation completed;Falls prevention discussed   Functional Status Survey:    Vitals:   03/29/19 1039  BP: (!) 98/56  Pulse: 100  Weight: 104 lb 9.6 oz (47.4 kg)   Body mass index  is 17.95 kg/m. Physical Exam Vitals signs and nursing note reviewed.  Constitutional:      General: She is in acute distress (mildy uncomfortable).     Appearance: She is not diaphoretic.  HENT:     Head: Normocephalic and atraumatic.     Nose: Nose normal. No congestion.     Mouth/Throat:     Mouth: Mucous membranes are dry.     Pharynx: No oropharyngeal exudate.  Neck:     Vascular: No JVD.  Cardiovascular:     Rate and Rhythm: Normal rate and regular rhythm.     Heart sounds: No murmur.     Comments: Bounding heart sounds Pulmonary:     Effort: Pulmonary effort is normal. No respiratory distress.     Breath sounds: Normal breath sounds. No wheezing.  Abdominal:     General: Bowel sounds are normal. There is no distension.     Palpations: Abdomen is soft.     Tenderness: There is abdominal tenderness (moaned during exam).  Musculoskeletal:     Right lower leg: No edema.     Left lower leg: No edema.  Skin:    General: Skin is warm and dry.  Neurological:     Mental Status: She is alert.     Comments: Alert, won't make eye contact or follow commands.      Labs reviewed: Recent Labs     05/31/18 0837  08/02/18 0524 08/03/18 0708 08/04/18 0720 10/20/18 0600  NA 138   < > 138 140 140 141  K 3.8   < > 3.4* 3.3* 3.5 4.4  CL 107   < > 110 109 107  --   CO2 23   < > 22 23 23   --   GLUCOSE 123*   < > 121* 104* 98  --   BUN 14   < > 11 13 11 12   CREATININE 0.66   < > 0.58 0.66 0.56 0.6  CALCIUM 8.4*   < > 8.0* 8.0* 8.3*  --   MG 1.7  --   --  1.7  --   --    < > = values in this interval not displayed.   Recent Labs    05/31/18 0837 07/30/18 2200 10/20/18 0600  AST 23 19 13   ALT 15 16 6*  ALKPHOS 84 94 125  BILITOT 0.6 0.8  --   PROT 5.9* 6.7  --   ALBUMIN 3.2* 3.7  --    Recent Labs    05/31/18 0837 06/01/18 0125 07/30/18 2200 08/01/18 0339 08/03/18 0708 08/04/18 0720 10/20/18 0600  WBC 12.1* 7.2 12.3* 8.6  --   --  6.8  NEUTROABS 10.9*  --  10.3*  --   --   --   --   HGB 11.6* 10.7* 10.8* 9.7* 8.3* 8.9* 11.2*  HCT 36.9 33.0* 35.8* 31.0* 26.2* 28.2* 34*  MCV 93.4 91.9 96.5 90.4  --   --   --   PLT 248 244 231 196  --   --  254   Lab Results  Component Value Date   TSH 3.16 10/20/2018   Lab Results  Component Value Date   HGBA1C 5.9 03/11/2019   Lab Results  Component Value Date   CHOL  12/26/2006    175        ATP III CLASSIFICATION:  <200     mg/dL   Desirable  937-342  mg/dL   Borderline High  >=876  mg/dL   High   HDL 45 03/50/0938   LDLCALC (H) 12/26/2006    109        Total Cholesterol/HDL:CHD Risk Coronary Heart Disease Risk Table                     Men   Women  1/2 Average Risk   3.4   3.3   TRIG 103 12/26/2006   CHOLHDL 3.9 12/26/2006    Significant Diagnostic Results in last 30 days:  No results found.  Assessment/Plan 1. Weight loss Due to decreased intake associated with acute illness  2. Oropharyngeal dysphagia Continue puree diet with 1:1 assistance and supervision  3. Pain management Unclear etiology of her pain, appears to be moaning at times.  Goals of care are comfort based.  Increase morphine to 5 mg  q 4 hrs prn pain Add Ativan 0.25 mg q 6 prn which may be given together with roxanol to improve symptoms.   4. End of life care Appears to be nearing the end of life. She has severe frontotemporal dementia and also had acute hypotension last week that led to worsening in her condition. DNR in place. No hospitalizations.   5. Dehydration Appears dry on exam with decrease intake and low bp. During prior discussion we had indicated no IVs and they family is agreement with this plan. Continue oral care and boost as needed for comfort     Family/ staff Communication: Nurse Tammy to communicate to her family Labs/tests ordered:  NA

## 2019-03-30 ENCOUNTER — Encounter: Payer: Self-pay | Admitting: Internal Medicine

## 2019-03-30 ENCOUNTER — Non-Acute Institutional Stay (SKILLED_NURSING_FACILITY): Payer: Medicare Other | Admitting: Internal Medicine

## 2019-03-30 DIAGNOSIS — Z515 Encounter for palliative care: Secondary | ICD-10-CM

## 2019-03-30 DIAGNOSIS — R1312 Dysphagia, oropharyngeal phase: Secondary | ICD-10-CM

## 2019-03-30 DIAGNOSIS — G3109 Other frontotemporal dementia: Secondary | ICD-10-CM

## 2019-03-30 DIAGNOSIS — K5901 Slow transit constipation: Secondary | ICD-10-CM

## 2019-03-30 DIAGNOSIS — I950 Idiopathic hypotension: Secondary | ICD-10-CM

## 2019-03-30 DIAGNOSIS — F0280 Dementia in other diseases classified elsewhere without behavioral disturbance: Secondary | ICD-10-CM

## 2019-03-30 DIAGNOSIS — E119 Type 2 diabetes mellitus without complications: Secondary | ICD-10-CM

## 2019-03-30 NOTE — Progress Notes (Signed)
Patient ID: Michelle [name], female   DOB: [birth year], 85 y.o.   MRN: [id number]   Location:  [facility] Room Number: 106-A Place of Service:  SNF [phone]) Provider:    Kermit Balo, DO  Patient Care Team: Kermit Balo, DO as PCP - General (Geriatric Medicine) Fletcher Anon, NP as Nurse Practitioner (Nurse Practitioner)  Extended Emergency Contact Information Primary Emergency Contact: Blake Divine Address: [address]          [place], [place] [address] [place] Work Phone: [phone] Mobile Phone: [phone] Relation: Son Secondary Emergency Contact: Charleston Poot [place] Home Phone: [phone] Relation: Son   Code Status:  DNR Goals of care: Advanced Directive information Advanced Directives 03/30/2019  Does Patient Have a Medical Advance Directive? Yes  Type of Estate agent of [place];Living will;Out of facility DNR (pink MOST or yellow form)  Does patient want to make changes to medical advance directive? No - Patient declined  Copy of Healthcare Power of Attorney in Chart? Yes - validated most recent copy scanned in chart (See row information)  Pre-existing out of facility DNR order (yellow form or pink MOST form) Yellow form placed in chart (order not valid for inpatient use)     Chief Complaint  Patient presents with  . Medical Management of Chronic Issues    Routine Visit   . Quality Metric Gaps    Foot Exam, MALB, and Eye Exam Due     HPI:  Pt is a 85 y.o. female seen today for medical management of chronic diseases.    Resident is on comfort care after an episode of hypotension of unknown etiology.  She's been quite frail with advanced dementia (primary progressive aphasia/frontotemporal dementia) since she came here.  She's had behaviors.    Recently, her po intake has been primarily in liquid form with nutritional supplements of boost.  Her family has requested  that she be kept comfortable.  MOST was completed by NP.   She was having considerable difficulty with constipation, but this is improved with use of senokot-s and miralax qod.    She's had some evidence of discomfort with agitation at times (left hip fx prior to admission) and has morphine available for severe pain, tylenol for mild to moderate pain.  She has ativan for anxiety and agitation as well as buspar tid.  She has seroquel for her psychosis which has remained necessary.  Past Medical History:  Diagnosis Date  . Angina pectoris (HCC)    intrascapular, abnormal cardiolote preop TKR  . Anxiety   . Arthritis   . ASCVD (arteriosclerotic cardiovascular disease)    S/P NSTEMI with LCX stent 2008, cath on 06/29/2008 revealed widely patent circumflex  . Bacterial infection 2016  . Coronary artery disease   . Depression   . Diabetes mellitus   . Frontotemporal dementia (HCC) 11/16/2018  . GERD (gastroesophageal reflux disease)   . Heart attack (HCC) 2010  . Hyperlipidemia   . Hypertension   . Hypothyroidism   . Myocardial infarction [facility]) [id number]   with stents  . Osteoporosis   . Rheumatoid arthritis (HCC) 2013  . Trigger finger 02/2013  . Vascular dementia (HCC)   . Vertigo 2016  . Vitamin D deficiency    Past Surgical History:  Procedure Laterality Date  . ABDOMINAL HYSTERECTOMY  1969  . BREAST BIOPSY  2005   right -benign  . BREAST SURGERY  2004   right -cancerous-with radiation  .  broken finger  2006  . CORONARY ANGIOPLASTY  2008, 2010  . foot ampuation  12/2009   right  . HAMMER TOE SURGERY  06/2009   right  . HERNIA REPAIR  [id number]  . INTRAMEDULLARY (IM) NAIL INTERTROCHANTERIC Left 07/31/2018   Procedure: INTRAMEDULLARY (IM) NAIL INTERTROCHANTRIC;  Surgeon: Shona Needles, MD;  Location: [place];  Service: Orthopedics;  Laterality: Left;  . KNEE ARTHROSCOPY  1980 ,1994   bilateral  . TOTAL GASTRECTOMY  1983   with vagotomy  . TOTAL KNEE ARTHROPLASTY  02/26/2012    Procedure: TOTAL KNEE ARTHROPLASTY;  Surgeon: Tobi Bastos, MD;  [facility];  Service: Orthopedics;  Laterality: Right;    No Known Allergies  Outpatient Encounter Medications as of 03/30/2019  Medication Sig  . acetaminophen (TYLENOL) 325 MG tablet Take 650 mg by mouth every 6 (six) hours as needed (For mild pain or fever >100).   . busPIRone (BUSPAR) 15 MG tablet Take 15 mg by mouth 3 (three) times daily.  . Cholecalciferol (VITAMIN D) 50 MCG (2000 UT) tablet Take 2,000 Units by mouth daily.  Marland Kitchen lactose free nutrition (BOOST) LIQD Take 237 mLs by mouth daily as needed.   Marland Kitchen LORazepam (ATIVAN) 0.5 MG tablet Take 0.5 tablets (0.25 mg total) by mouth every 8 (eight) hours as needed for anxiety. X 14 days  . morphine 20 MG/5ML solution Take 5 mg by mouth every 4 (four) hours as needed for pain.  . polyethylene glycol (MIRALAX / GLYCOLAX) 17 g packet Take 17 g by mouth every other day.   Marland Kitchen QUEtiapine (SEROQUEL) 50 MG tablet 50 mg in the morning and 75 mg in the pm  . sennosides-docusate sodium (SENOKOT-S) 8.6-50 MG tablet Take 1 tablet by mouth every other day.  . sertraline (ZOLOFT) 100 MG tablet Take 100 mg by mouth daily.  . [DISCONTINUED] morphine (ROXANOL) 20 MG/ML concentrated solution Take 0.25 mLs (5 mg total) by mouth every 6 (six) hours as needed for severe pain. (Patient taking differently: Take 5 mg by mouth every 4 (four) hours as needed for severe pain. )   No facility-administered encounter medications on file as of 03/30/2019.    From nursing/notes: Review of Systems  Constitutional: Positive for activity change and fatigue.       More time in bed and less time in chair and interacting with staff in hall  HENT: Negative for congestion.   Eyes: Negative for visual disturbance.  Respiratory: Negative for chest tightness and shortness of breath.   Cardiovascular: Negative for chest pain, palpitations and leg swelling.  Genitourinary: Negative for dysuria.   Musculoskeletal: Positive for arthralgias and gait problem.  Skin: Negative for color change.  Neurological: Positive for weakness. Negative for dizziness.  Hematological: Bruises/bleeds easily.  Psychiatric/Behavioral: Positive for agitation, behavioral problems, confusion and hallucinations. Negative for sleep disturbance. The patient is nervous/anxious.     Immunization History  Administered Date(s) Administered  . DTaP 11/30/2018  . Influenza, High Dose Seasonal PF 04/03/2017, 03/11/2019  . Influenza-Unspecified 08/06/2018  . PPD Test 10/27/2017  . Pneumococcal Conjugate-13 12/02/2014  . Pneumococcal Polysaccharide-23 05/27/2005  . Tdap 11/26/2018  . Zoster Recombinat (Shingrix) 11/24/2018   Pertinent  Health Maintenance Due  Topic Date Due  . FOOT EXAM  11/06/1937  . OPHTHALMOLOGY EXAM  11/06/1937  . URINE MICROALBUMIN  11/06/1937  . HEMOGLOBIN A1C  09/09/2019  . INFLUENZA VACCINE  Completed  . DEXA SCAN  Completed  . PNA vac Low Risk Adult  Completed  Fall Risk  02/18/2019 10/16/2018  Falls in the past year? 1 1  Number falls in past yr: 1 1  Injury with Fall? 1 1  Risk for fall due to : History of fall(s);Impaired balance/gait;Impaired mobility History of fall(s);Impaired mobility  Follow up Falls evaluation completed;Falls prevention discussed Falls evaluation completed;Falls prevention discussed   Functional Status Survey:    There were no vitals filed for this visit. There is no height or weight on file to calculate BMI. Physical Exam Vitals signs and nursing note reviewed.  Constitutional:      Comments: Drowsy resting in bed, appears to be dreaming with twitching of extremities--did not awaken with her name was called or her arm lightly touched; frail, cachectic female with sarcopenia  HENT:     Head: Normocephalic and atraumatic.  Cardiovascular:     Rate and Rhythm: Normal rate and regular rhythm.     Pulses: Normal pulses.     Heart sounds: Normal heart  sounds.  Pulmonary:     Effort: Pulmonary effort is normal.     Breath sounds: Normal breath sounds. No wheezing, rhonchi or rales.  Abdominal:     General: Bowel sounds are normal.     Palpations: Abdomen is soft.     Tenderness: There is no abdominal tenderness.  Musculoskeletal:     Right lower leg: No edema.     Left lower leg: No edema.  Skin:    General: Skin is warm and dry.  Neurological:     Comments: Resting comfortably, some twitches in her sleep  Psychiatric:     Comments: Could not assess as was not arousable during exam     Labs reviewed: Recent Labs    05/31/18 0837  08/02/18 0524 08/03/18 0708 08/04/18 0720 10/20/18 0600  NA 138   < > 138 140 140 141  K 3.8   < > 3.4* 3.3* 3.5 4.4  CL 107   < > 110 109 107  --   CO2 23   < > 22 23 23   --   GLUCOSE 123*   < > 121* 104* 98  --   BUN 14   < > 11 13 11 12   CREATININE 0.66   < > 0.58 0.66 0.56 0.6  CALCIUM 8.4*   < > 8.0* 8.0* 8.3*  --   MG 1.7  --   --  1.7  --   --    < > = values in this interval not displayed.   Recent Labs    05/31/18 0837 07/30/18 2200 10/20/18 0600  AST 23 19 13   ALT 15 16 6*  ALKPHOS 84 94 125  BILITOT 0.6 0.8  --   PROT 5.9* 6.7  --   ALBUMIN 3.2* 3.7  --    Recent Labs    05/31/18 0837 06/01/18 0125 07/30/18 2200 08/01/18 0339 08/03/18 0708 08/04/18 0720 10/20/18 0600  WBC 12.1* 7.2 12.3* 8.6  --   --  6.8  NEUTROABS 10.9*  --  10.3*  --   --   --   --   HGB 11.6* 10.7* 10.8* 9.7* 8.3* 8.9* 11.2*  HCT 36.9 33.0* 35.8* 31.0* 26.2* 28.2* 34*  MCV 93.4 91.9 96.5 90.4  --   --   --   PLT 248 244 231 196  --   --  254   Lab Results  Component Value Date   TSH 3.16 10/20/2018   Lab Results  Component Value Date  HGBA1C 5.9 03/11/2019   Lab Results  Component Value Date   CHOL  12/26/2006    175        ATP III CLASSIFICATION:  <200     mg/dL   Desirable  045-409200-239  mg/dL   Borderline High  >=811>=240    mg/dL   High   HDL 45 91/47/829508/05/2006   LDLCALC (H)  12/26/2006    109        Total Cholesterol/HDL:CHD Risk Coronary Heart Disease Risk Table                     Men   Women  1/2 Average Risk   3.4   3.3   TRIG 103 12/26/2006   CHOLHDL 3.9 12/26/2006    Assessment/Plan 1. Frontotemporal dementia (HCC) -advanced stage with adl dependence -cont snf care with comfort measures -cont seroquel for psychosis and ativan for anxiety  2. Oropharyngeal dysphagia -ongoing, cont aspiration precautions, mostly liquid diet per her current preferences  3. Slow transit constipation -cont bowel regimen with senokot-s and miralax  4. Idiopathic hypotension -intake has been poor and she was felt to be dry at the time of this episode -last bp on file remains low -staff are offering fluids and boost which she does take much of the time though refuses food  5. End of life care -cont comfort medications and palliative approach to care as requested by family  6. Controlled type 2 diabetes mellitus without complication, without long-term current use of insulin (HCC) -no longer monitoring regularly due to comfort goals  Family/ staff Communication: discussed with snf nurse  Labs/tests ordered:  No new due to comfort goals  Josslyn Ciolek L. Shalika Arntz, D.O. Geriatrics [facility] [address] [place], [place] [address] Cell Phone (Mon-Fri 8am-5pm):  [phone] On Call:  [phone] & follow prompts after 5pm & weekends Office Phone:  [phone] Office Fax:  [phone]

## 2019-03-31 ENCOUNTER — Encounter: Payer: Self-pay | Admitting: Internal Medicine

## 2019-04-05 ENCOUNTER — Non-Acute Institutional Stay (SKILLED_NURSING_FACILITY): Payer: Medicare Other | Admitting: Adult Health

## 2019-04-05 DIAGNOSIS — G3109 Other frontotemporal dementia: Secondary | ICD-10-CM | POA: Diagnosis not present

## 2019-04-05 DIAGNOSIS — F0281 Dementia in other diseases classified elsewhere with behavioral disturbance: Secondary | ICD-10-CM

## 2019-04-05 DIAGNOSIS — R509 Fever, unspecified: Secondary | ICD-10-CM | POA: Diagnosis not present

## 2019-04-05 DIAGNOSIS — R0602 Shortness of breath: Secondary | ICD-10-CM | POA: Diagnosis not present

## 2019-04-05 DIAGNOSIS — I959 Hypotension, unspecified: Secondary | ICD-10-CM

## 2019-04-05 DIAGNOSIS — F02818 Dementia in other diseases classified elsewhere, unspecified severity, with other behavioral disturbance: Secondary | ICD-10-CM

## 2019-04-05 MED ORDER — MORPHINE SULFATE (CONCENTRATE) 20 MG/ML PO SOLN: 5.0000 mg | Freq: Four times a day (QID) | ORAL | 0 refills | Status: AC

## 2019-04-06 ENCOUNTER — Encounter: Payer: Self-pay | Admitting: Adult Health

## 2019-04-12 ENCOUNTER — Encounter: Payer: Self-pay | Admitting: Internal Medicine

## 2019-04-27 NOTE — Progress Notes (Signed)
Location:  Medical illustrator of Service:  SNF (31) Provider:   Peggye Ley, ANP [facility] [phone]   Kermit Balo, DO  Patient Care Team: Kermit Balo, DO as PCP - General (Geriatric Medicine) Fletcher Anon, NP as Nurse Practitioner (Nurse Practitioner)  Extended Emergency Contact Information Primary Emergency Contact: Blake Divine Address: [address]          [place], [place] [address] [place] of [place] Work Phone: [phone] Mobile Phone: [phone] Relation: Son Secondary Emergency Contact: Charleston Poot [place] of [place] Home Phone: [phone] Relation: Son  Code Status:  DNR Goals of care: Advanced Directive information Advanced Directives 03/30/2019  Does Patient Have a Medical Advance Directive? Yes  Type of Estate agent of [place];Living will;Out of facility DNR (pink MOST or yellow form)  Does patient want to make changes to medical advance directive? No - Patient declined  Copy of Healthcare Power of Attorney in Chart? Yes - validated most recent copy scanned in chart (See row information)  Pre-existing out of facility DNR order (yellow form or pink MOST form) Yellow form placed in chart (order not valid for inpatient use)     Chief Complaint  Patient presents with  . Acute Visit    increased WOB, end of life    HPI:  Pt is a 85 y.o. female seen today for an acute visit for increase wob breathing, hypotension, and end of life. She has severe frontotemporal dementia and resides in skilled care. The nurse reports she has a temp of 102, recheck after tylenol was 101.  She has increased work of breathing and pale color. She is alert but not responding, not making urine, and appears pale and restless. Her family is at the bedside. The nurse is requesting additional medication to help her work of breathing and comfort. She developed acute hypotension on 10/22 which was not  treated due to her goals of care and is nearing the end of life.    Past Medical History:  Diagnosis Date  . Angina pectoris (HCC)    intrascapular, abnormal cardiolote preop TKR  . Anxiety   . Arthritis   . ASCVD (arteriosclerotic cardiovascular disease)    S/P NSTEMI with LCX stent 2008, cath on 06/29/2008 revealed widely patent circumflex  . Bacterial infection 2016  . Coronary artery disease   . Depression   . Diabetes mellitus   . Frontotemporal dementia (HCC) 11/16/2018  . GERD (gastroesophageal reflux disease)   . Heart attack (HCC) 2010  . Hyperlipidemia   . Hypertension   . Hypothyroidism   . Myocardial infarction [facility]) [phone]   with stents  . Osteoporosis   . Rheumatoid arthritis (HCC) 2013  . Trigger finger 02/2013  . Vascular dementia (HCC)   . Vertigo 2016  . Vitamin D deficiency    Past Surgical History:  Procedure Laterality Date  . ABDOMINAL HYSTERECTOMY  1969  . BREAST BIOPSY  2005   right -benign  . BREAST SURGERY  2004   right -cancerous-with radiation  . broken finger  2006  . CORONARY ANGIOPLASTY  2008, 2010  . foot ampuation  12/2009   right  . HAMMER TOE SURGERY  06/2009   right  . HERNIA REPAIR  [id number]  . INTRAMEDULLARY (IM) NAIL INTERTROCHANTERIC Left 07/31/2018   Procedure: INTRAMEDULLARY (IM) NAIL INTERTROCHANTRIC;  Surgeon: Roby Lofts, MD;  Location: [facility] OR;  Service: Orthopedics;  Laterality: Left;  . KNEE ARTHROSCOPY  1980 ,1994  bilateral  . TOTAL GASTRECTOMY  1983   with vagotomy  . TOTAL KNEE ARTHROPLASTY  02/26/2012   Procedure: TOTAL KNEE ARTHROPLASTY;  Surgeon: Jacki Cones, MD;  Location: [facility] ORS;  Service: Orthopedics;  Laterality: Right;    No Known Allergies  Outpatient Encounter Medications as of 05-01-2019  Medication Sig  . morphine (ROXANOL) 20 MG/ML concentrated solution Take 0.25 mLs (5 mg total) by mouth 4 (four) times daily.  . [DISCONTINUED] morphine (ROXANOL) 20 MG/ML concentrated solution Take 5 mg by  mouth 4 (four) times daily.  Marland Kitchen acetaminophen (TYLENOL) 325 MG tablet Take 650 mg by mouth every 6 (six) hours as needed (For mild pain or fever >100).   . busPIRone (BUSPAR) 15 MG tablet Take 15 mg by mouth 3 (three) times daily.  . Cholecalciferol (VITAMIN D) 50 MCG (2000 UT) tablet Take 2,000 Units by mouth daily.  Marland Kitchen lactose free nutrition (BOOST) LIQD Take 237 mLs by mouth daily as needed.   Marland Kitchen LORazepam (ATIVAN) 0.5 MG tablet Take 0.5 tablets (0.25 mg total) by mouth every 8 (eight) hours as needed for anxiety. X 14 days  . morphine 20 MG/5ML solution Take 5 mg by mouth every 2 (two) hours as needed for pain.   . polyethylene glycol (MIRALAX / GLYCOLAX) 17 g packet Take 17 g by mouth every other day.   Marland Kitchen QUEtiapine (SEROQUEL) 50 MG tablet 50 mg in the morning and 75 mg in the pm  . sennosides-docusate sodium (SENOKOT-S) 8.6-50 MG tablet Take 1 tablet by mouth every other day.  . sertraline (ZOLOFT) 100 MG tablet Take 100 mg by mouth daily.   No facility-administered encounter medications on file as of 05/01/2019.     Review of Systems  Immunization History  Administered Date(s) Administered  . DTaP 11/30/2018  . Influenza, High Dose Seasonal PF 04/03/2017, 03/11/2019  . Influenza-Unspecified 08/06/2018  . PPD Test 10/27/2017  . Pneumococcal Conjugate-13 12/02/2014  . Pneumococcal Polysaccharide-23 05/27/2005  . Tdap 11/26/2018  . Zoster Recombinat (Shingrix) 11/24/2018   Pertinent  Health Maintenance Due  Topic Date Due  . HEMOGLOBIN A1C  09/09/2019  . FOOT EXAM  03/29/2020  . INFLUENZA VACCINE  Completed  . DEXA SCAN  Completed  . PNA vac Low Risk Adult  Completed   Fall Risk  03/31/2019 02/18/2019 10/16/2018  Falls in the past year? - 1 1  Number falls in past yr: - 1 1  Injury with Fall? - 1 1  Risk for fall due to : History of fall(s);Mental status change;Impaired balance/gait;Impaired mobility;Medication side effect History of fall(s);Impaired balance/gait;Impaired  mobility History of fall(s);Impaired mobility  Risk for fall due to: Comment addressed at [facility] - -  Follow up - Falls evaluation completed;Falls prevention discussed Falls evaluation completed;Falls prevention discussed   Functional Status Survey:    Vitals:   04/06/19 0755  BP: (!) 71/50  Resp: (!) 22  Temp: 99.8 F (37.7 C)  SpO2: (!) 85%   There is no height or weight on file to calculate BMI. Physical Exam Vitals signs and nursing note reviewed.  Constitutional:      General: She is not in acute distress.    Appearance: She is not diaphoretic.  HENT:     Head: Normocephalic and atraumatic.     Mouth/Throat:     Mouth: Mucous membranes are dry.  Eyes:     Conjunctiva/sclera: Conjunctivae normal.     Pupils: Pupils are equal, round, and reactive to light.  Neck:  Vascular: No JVD.  Cardiovascular:     Rate and Rhythm: Tachycardia present.     Heart sounds: Murmur present.  Pulmonary:     Effort: Pulmonary effort is normal. No respiratory distress.     Breath sounds: Wheezing and rhonchi present.  Abdominal:     General: There is no distension.     Palpations: Abdomen is soft.     Comments: hypoactive  Musculoskeletal:     Right lower leg: No edema.     Left lower leg: No edema.  Skin:    General: Skin is warm and dry.     Coloration: Skin is pale.     Comments: Cyanosis to toes  Neurological:     Mental Status: She is alert.     Comments: Eyes are open but she does not track and is not verbal or able to f/c. [place] and rigidity noted      Labs reviewed: Recent Labs    05/31/18 0837  08/02/18 0524 08/03/18 0708 08/04/18 0720 10/20/18 0600  NA 138   < > 138 140 140 141  K 3.8   < > 3.4* 3.3* 3.5 4.4  CL 107   < > 110 109 107  --   CO2 23   < > 22 23 23   --   GLUCOSE 123*   < > 121* 104* 98  --   BUN 14   < > 11 13 11 12   CREATININE 0.66   < > 0.58 0.66 0.56 0.6  CALCIUM 8.4*   < > 8.0* 8.0* 8.3*  --   MG 1.7  --   --  1.7  --   --    < >  = values in this interval not displayed.   Recent Labs    05/31/18 0837 07/30/18 2200 10/20/18 0600  AST 23 19 13   ALT 15 16 6*  ALKPHOS 84 94 125  BILITOT 0.6 0.8  --   PROT 5.9* 6.7  --   ALBUMIN 3.2* 3.7  --    Recent Labs    05/31/18 0837 06/01/18 0125 07/30/18 2200 08/01/18 0339 08/03/18 0708 08/04/18 0720 10/20/18 0600  WBC 12.1* 7.2 12.3* 8.6  --   --  6.8  NEUTROABS 10.9*  --  10.3*  --   --   --   --   HGB 11.6* 10.7* 10.8* 9.7* 8.3* 8.9* 11.2*  HCT 36.9 33.0* 35.8* 31.0* 26.2* 28.2* 34*  MCV 93.4 91.9 96.5 90.4  --   --   --   PLT 248 244 231 196  --   --  254   Lab Results  Component Value Date   TSH 3.16 10/20/2018   Lab Results  Component Value Date   HGBA1C 5.9 03/11/2019   Lab Results  Component Value Date   CHOL  12/26/2006    175        ATP III CLASSIFICATION:  <200     mg/dL   Desirable  161-096200-239  mg/dL   Borderline High  >=045>=240    mg/dL   High   HDL 45 40/98/119108/05/2006   LDLCALC (H) 12/26/2006    109        Total Cholesterol/HDL:CHD Risk Coronary Heart Disease Risk Table                     Men   Women  1/2 Average Risk   3.4   3.3   TRIG 103 12/26/2006   CHOLHDL  3.9 12/26/2006    Significant Diagnostic Results in last 30 days:  No results found.  Assessment/Plan 1. Shortness of breath Death appears imminent and additional intervention is needed to provide comfort  Increase Roxanol 5 mg qid and 2 hrs prn pain or sob Add Atropine gtts 4 oral q 6 hrs prn secretions D/C all other oral meds  2. Hypotension, unspecified hypotension type Likely due to underlying sepsis/dehydration  3. Frontotemporal dementia with behavioral disturbance ([place]) Severe in nature which led to her skilled care admission earlier this year.   4. FUO Place on isolation and check covid swab. May use tylenol supp as needed per facility protocol. No additional intervention due to goals of care.    Family/ staff Communication: both of her sons and daughter in law  are at the bedside  Labs/tests ordered:  NA

## 2019-04-27 DEATH — deceased
